# Patient Record
Sex: Female | Born: 1950 | Race: White | Hispanic: No | Marital: Married | Smoking: Former smoker
Health system: Southern US, Community
[De-identification: ages and names within clinical notes are randomized; demographics above are authoritative.]

## PROBLEM LIST (undated history)

## (undated) DIAGNOSIS — F32A Depression, unspecified: Secondary | ICD-10-CM

## (undated) DIAGNOSIS — J449 Chronic obstructive pulmonary disease, unspecified: Secondary | ICD-10-CM

## (undated) DIAGNOSIS — D649 Anemia, unspecified: Secondary | ICD-10-CM

## (undated) DIAGNOSIS — F329 Major depressive disorder, single episode, unspecified: Secondary | ICD-10-CM

## (undated) DIAGNOSIS — C801 Malignant (primary) neoplasm, unspecified: Secondary | ICD-10-CM

## (undated) DIAGNOSIS — E039 Hypothyroidism, unspecified: Secondary | ICD-10-CM

## (undated) DIAGNOSIS — M1990 Unspecified osteoarthritis, unspecified site: Secondary | ICD-10-CM

## (undated) DIAGNOSIS — G709 Myoneural disorder, unspecified: Secondary | ICD-10-CM

## (undated) DIAGNOSIS — E119 Type 2 diabetes mellitus without complications: Secondary | ICD-10-CM

## (undated) DIAGNOSIS — F419 Anxiety disorder, unspecified: Secondary | ICD-10-CM

## (undated) HISTORY — PX: EYE SURGERY: SHX253

## (undated) HISTORY — PX: TONSILLECTOMY: SUR1361

## (undated) HISTORY — PX: ABDOMINAL HYSTERECTOMY: SHX81

## (undated) HISTORY — PX: FRACTURE SURGERY: SHX138

---
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
## 1986-12-24 HISTORY — PX: BREAST SURGERY: SHX581

## 2002-02-24 ENCOUNTER — Inpatient Hospital Stay (HOSPITAL_COMMUNITY): Admission: AD | Admit: 2002-02-24 | Discharge: 2002-02-26 | Payer: Self-pay | Admitting: Cardiovascular Disease

## 2003-11-22 ENCOUNTER — Emergency Department (HOSPITAL_COMMUNITY): Admission: EM | Admit: 2003-11-22 | Discharge: 2003-11-22 | Payer: Self-pay | Admitting: Emergency Medicine

## 2004-11-27 ENCOUNTER — Encounter: Admission: RE | Admit: 2004-11-27 | Discharge: 2004-11-27 | Payer: Self-pay | Admitting: Orthopaedic Surgery

## 2004-11-28 ENCOUNTER — Ambulatory Visit (HOSPITAL_COMMUNITY): Admission: RE | Admit: 2004-11-28 | Discharge: 2004-11-28 | Payer: Self-pay | Admitting: Orthopaedic Surgery

## 2004-11-28 ENCOUNTER — Ambulatory Visit (HOSPITAL_BASED_OUTPATIENT_CLINIC_OR_DEPARTMENT_OTHER): Admission: RE | Admit: 2004-11-28 | Discharge: 2004-11-28 | Payer: Self-pay | Admitting: Orthopaedic Surgery

## 2005-05-08 ENCOUNTER — Ambulatory Visit (HOSPITAL_BASED_OUTPATIENT_CLINIC_OR_DEPARTMENT_OTHER): Admission: RE | Admit: 2005-05-08 | Discharge: 2005-05-08 | Payer: Self-pay | Admitting: Orthopaedic Surgery

## 2005-05-08 ENCOUNTER — Ambulatory Visit (HOSPITAL_COMMUNITY): Admission: RE | Admit: 2005-05-08 | Discharge: 2005-05-08 | Payer: Self-pay | Admitting: Orthopaedic Surgery

## 2005-06-01 ENCOUNTER — Inpatient Hospital Stay (HOSPITAL_COMMUNITY): Admission: EM | Admit: 2005-06-01 | Discharge: 2005-06-11 | Payer: Self-pay | Admitting: Emergency Medicine

## 2005-09-25 ENCOUNTER — Inpatient Hospital Stay (HOSPITAL_COMMUNITY): Admission: RE | Admit: 2005-09-25 | Discharge: 2005-10-01 | Payer: Self-pay | Admitting: Orthopaedic Surgery

## 2005-09-28 ENCOUNTER — Encounter: Payer: Self-pay | Admitting: Cardiology

## 2005-09-28 ENCOUNTER — Ambulatory Visit: Payer: Self-pay | Admitting: Cardiology

## 2006-05-23 ENCOUNTER — Ambulatory Visit (HOSPITAL_COMMUNITY): Admission: RE | Admit: 2006-05-23 | Discharge: 2006-05-24 | Payer: Self-pay | Admitting: Orthopaedic Surgery

## 2006-10-24 ENCOUNTER — Ambulatory Visit (HOSPITAL_COMMUNITY): Admission: RE | Admit: 2006-10-24 | Discharge: 2006-10-25 | Payer: Self-pay | Admitting: Orthopaedic Surgery

## 2008-12-27 ENCOUNTER — Other Ambulatory Visit: Payer: Self-pay | Admitting: Orthopedic Surgery

## 2008-12-27 ENCOUNTER — Encounter: Admission: RE | Admit: 2008-12-27 | Discharge: 2008-12-27 | Payer: Self-pay | Admitting: Orthopedic Surgery

## 2008-12-29 ENCOUNTER — Other Ambulatory Visit: Payer: Self-pay | Admitting: Orthopedic Surgery

## 2008-12-29 ENCOUNTER — Inpatient Hospital Stay (HOSPITAL_COMMUNITY): Admission: EM | Admit: 2008-12-29 | Discharge: 2009-01-04 | Payer: Self-pay | Admitting: Orthopedic Surgery

## 2011-04-09 LAB — GLUCOSE, CAPILLARY
Glucose-Capillary: 106 mg/dL — ABNORMAL HIGH (ref 70–99)
Glucose-Capillary: 109 mg/dL — ABNORMAL HIGH (ref 70–99)
Glucose-Capillary: 126 mg/dL — ABNORMAL HIGH (ref 70–99)
Glucose-Capillary: 157 mg/dL — ABNORMAL HIGH (ref 70–99)
Glucose-Capillary: 164 mg/dL — ABNORMAL HIGH (ref 70–99)
Glucose-Capillary: 82 mg/dL (ref 70–99)
Glucose-Capillary: 85 mg/dL (ref 70–99)
Glucose-Capillary: 88 mg/dL (ref 70–99)
Glucose-Capillary: 91 mg/dL (ref 70–99)
Glucose-Capillary: 95 mg/dL (ref 70–99)
Glucose-Capillary: 96 mg/dL (ref 70–99)

## 2011-04-09 LAB — CBC
HCT: 33.5 % — ABNORMAL LOW (ref 36.0–46.0)
Hemoglobin: 11.3 g/dL — ABNORMAL LOW (ref 12.0–15.0)
MCHC: 33.7 g/dL (ref 30.0–36.0)

## 2011-04-09 LAB — BASIC METABOLIC PANEL
BUN: 5 mg/dL — ABNORMAL LOW (ref 6–23)
CO2: 26 mEq/L (ref 19–32)
Calcium: 8.5 mg/dL (ref 8.4–10.5)
Creatinine, Ser: 0.5 mg/dL (ref 0.4–1.2)
GFR calc non Af Amer: >60 mL/min (ref >60)
GFR calc non Af Amer: >60 mL/min (ref >60)
Glucose, Bld: 103 mg/dL — ABNORMAL HIGH (ref 70–99)
Potassium: 3.6 mEq/L (ref 3.5–5.1)
Sodium: 137 mEq/L (ref 135–145)

## 2011-04-09 LAB — HEMOGLOBIN A1C
Hgb A1c MFr Bld: 6 % (ref 4.6–6.1)
Mean Plasma Glucose: 126 mg/dL

## 2011-05-08 NOTE — Op Note (Signed)
[name], [name]            ACCOUNT NO.:  [id number]   MEDICAL RECORD NO.:  [id number]          PATIENT TYPE:  AMB   LOCATION:  DSC                          FACILITY:  [facility]   PHYSICIAN:  Leonides Grills, M.D.     DATE OF BIRTH:  January 08, 1951   DATE OF PROCEDURE:  12/29/2008  DATE OF DISCHARGE:                               OPERATIVE REPORT   PREOPERATIVE DIAGNOSES:  1. 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  2. Right medial talar dome osteochondral lesion.  3. Right anterior ankle impingement.  4. 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  5. Right traumatic hallux valgus.  6. Right tight gastrocnemius.  7. 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   POSTOPERATIVE DIAGNOSES:  1. 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  2. Right medial talar dome osteochondral lesion.  3. Right anterior ankle impingement.  4. 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  5. Right traumatic hallux valgus.  6. Right tight gastrocnemius.  7. 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   OPERATION:  1. Right Brostrom procedure.  2. Right debridement and drilling, medial talar dome osteochondral      lesion.  3. Right ankle arthroscopy with extensive debridement.  4. Right first tarsometatarsal joint fusion with osteotomy.  5. Right local bone graft.  6. Stress X-rays, right foot.  7. Right modified McBride bunionectomy.  8. Right great toe digital nerve neurolysis.  9. Right gastroc slide.  10.Right flexor digitorum longus to navicular tendon transfer.   ANESTHESIA:  General.   SURGEON:  Leonides Grills, M.D.   ASSISTANT:  Richardean Canal, P.A.   ESTIMATED BLOOD LOSS:  Minimal.   TOURNIQUET TIME:  2 hours.   COMPLICATIONS:  None.   DISPOSITION:  Stable to PR.   INDICATIONS:  This is a  60 year old female who was injured at work and  sustained the above injuries.  She had a previous modified McBride  bunionectomy in the past as well.  It was difficult in surgical planning  or how to balance the forefoot and hindfoot due to the fact that she had  two problems that essentially worked against one another.  Typically  with this type of deformity and posting grade 2 post tibial tendon  insufficiency we would do a medializing  hindfoot osteotomy and possibly  a lengthening calcaneal osteotomy.  However, due to the fact that she  had ankle instability this may exacerbate this.  We elected not to  perform the medialized calcaneal osteotomy and to see how well she did  first with her in neutral alignment and repairing essentially what was  broken.  She was consented for the above procedure.  All risks to  include infection or vessel injury, nonunion, malunion, hardware  rotation, hardware failure, persistent pain, worsening pain, prolonged  recovery, stiffness, arthritis, the fact that smoking does cause in a  direct effect on wound healing, as well as nonunion and the  possibility  that she may have persistent pain that may require future surgery were  all explained.  Questions were encouraged and answered.   OPERATION:  The patient was brought to the operating room and placed in  the supine position.  After adequate general endotracheal anesthesia was  administered, as well as Ancef 1 gram IV piggyback the right lower  extremity was then prepped and draped in the sterile manner over  proximally placed thigh tourniquet.  We started the procedure by  anatomically mapping out the landmarks including our tibialis tendon,  peroneus tertius and the superficial peroneal nerve could not be seen.  A spinal needle was then placed just medial to the anterior tibialis  tendon.  The 20 mL of normal saline was instilled in the ankle.  Weston Brass  and spread technique was then utilized to create the  anteromedial portal  medial to the anterior tibialis tendon.  The blunt tip trocar with  cannula, followed by camera was then placed in the ankle and direct  visualization of the anterolateral portal was created with a spinal  needle, followed by nick and spread technique lateral to the peroneus  tertius tendon.  We illuminated from inside out to hope to protect  against inadvertent injury to the superficial peroneal nerve.  There was  a large amount of synovitis isolated anterolaterally.  This was  extensively debrided with a radiofrequency bevel, as well as a shaver.  We visualized cartilage in this area and there was no obvious  osteochondral lesion.  We then panned out medially and found that there  was a large osteochondral lesion on the medial talar dome.  We then  placed the camera anterolaterally visualizing anterior medially and then  debrided the lesion with a shaver, as well as a House curette.  Once  this was done, we also debrided extensively the local synovitis as well  around the area with a shaver, as well as radiofrequency bevel.  There  was a large amount of synovitis over the entire anterior aspect of the  ankle and it was also debrided.  We then placed multiple drill holes in  the base of the osteochondral lesion using microfracture technique.  This had an Conservation officer, historic buildings.  We then stopped inflow and this bled  nicely.  Pictures were obtained throughout the procedure.  The camera  was removed.  We then made a longitudinal incision over the medial  aspect of gastrocnemius muscle tendinous junction.  Dissection was  carried down through the skin.  Hemostasis was obtained.  The fascia was  opened in line with the incision.  A conjoined region was then developed  between the  gastroc-soleus musculature.  The soft tissue was elevated  off the posterior aspect of the gastrocnemius.  The sural nerve was  identified and protected posteriorly.  The gastrocnemius was then   released with curved Mayo scissors protecting the sural nerve  posteriorly.  This had excellent release of the tight gastroc.  The area  was copiously irrigated with normal saline.  The subcu was closed with 3-  0 Vicryl.  The skin was closed with 4-0 Monocryl subcuticular stitch.  Steri-Strips were applied.  We then gravity exsanguinated the right  lower extremity and tourniquet was elevated to 290 mmHg.  A curvilinear  incision through the previous incision over the anterolateral aspect of  the ankle was then made.  Dissection was carried down through skin.  Hemostasis was obtained.  There was adipose tissue in this area,  as  well.  The scar tissue was removed from the capsule site.  We then  incised the scar tissue capsule complex as close to the anterior border  of the lateral malleolus, leaving about a 2 mm cuff.  This was then  reflected.  We then removed the previous Ethibond from the scar tissue  as well.  We then made a trough into the anterior medial aspect of the  lateral malleolus.  This was very soft bone.  We then created drill  holes in the trough and then advanced the capsule.  Once it was  mobilized and the ankle was held in dorsiflexion everted position.  Advanced the capsule into the trough and drill holes using 2-0 FiberWire  stitch.  This had an Conservation officer, historic buildings.  We then repaired the remaining  cuff to the redundant periosteal/capsular cuff on the lateral malleolus  as well with 2-0 FiberWire stitch.  Again, this had an outstanding  repair.  We then made a longitudinal incision over the medial aspect  midline over the great toe, MTP joint.  Dissection was carried down  through skin.  Hemostasis was obtained.  A great toe digital nerve  neurolysis was then performed and once this was freed up off the  dorsomedial aspect of the MTP joint.  This was retracted out of harm's  way throughout the procedure.  L-shaped capsulotomy was then made.  Simple bunionectomy was  performed with a sagittal saw.  Rocky Link Johnson's  ridge was then rounded off with a rongeur.  The lateral capsule was then  released with curved Beaver blade protecting the cartilaginous surfaces  with a Engineer, site.  This had outstanding release of the tight capsule.  This  essentially completed the modified McBride bunionectomy, but later we  came back to reconstruct the medial capsule.  We then made a  longitudinal incision over the posterior tibial tendon.  Dissection was  carried down through skin.  Hemostasis was obtained.  Flexor retinacular  was opened in line with the incision.  There was a large amount of  synovitis along the posterior tibial tendon.  This was then debrided.  Once this was performed, a tenosynovectomy was the performed.  The FDL  tendon was then identified and traced to the knot of Henry and then  tenotomized as distal as possible.  We then made a drill hole into the  medial portion of the navicular tuberosity.  We then placed a 2-0 fiber  loop through the end of the FDL tendon and pulled this through the drill  hole itself.  We then using a free needle sewed this to the dorsal  periosteum.  We also sewed the FDL to the posterior tibial tendon with 2-  0 FiberWire stitch.  This had an Conservation officer, historic buildings.  The area was  copiously irrigated with normal saline.  We then made a longitudinal  incision over the dorsal aspect in the midline between EHL and the EHB.  Dissection was carried down through skin.  Hemostasis was obtained.  Both EHL and EHB were protected within their respective tendons.  The  periosteum and capsule over the first TMT joint was then elevated  sharply with this 15 blade scalpel.  We then entered the first TMT joint  and then performed an osteotomy, closing wedge type involving the medial  cuneiform.  We then removed this bone with a rongeur, as well as a  curved quarter-inch osteotome.  Once this was removed we then removed  the remaining  cartilage off  the base of the first metatarsal.  The bone  was very soft in this area.  Local bone graft was obtained from the  osteotomy, as well as from the local osteophytes as well and drill bits  and placed on the back table for later grafting.  Once the cartilage was  removed and there was posing well prepared surfaces that were congruent  we then used a two-point reduction clamp and under C-arm guidance found  that this was adequately corrected and the correction was not only in a  valgus plane, but also in a plantar flexion plane as well to help  maintain Meary's line.  Once the first TMT joint was reduced, after the  osteotomy we then used a bur to create a notch into the base of the  first metatarsal.  We then placed two crisscrossing 3.5 mm fully  threaded cortical lag screws using 3.5 and 2.5 mm drill holes,  respectively.  This had excellent purchase and maintenance of the  correction across the fusion site.  Stress and strain relieving local  bone graft was applied to the first TMT joint using a bur, followed by  packing the graft into place.  Stress x-rays were obtained in AP and  lateral planes and showed no gross motion with fixation and proposition  had excellent alignment as well.  We then returned back to the great toe  MTP joint and advanced the capsule both superiorly and proximally and  reconstructed this with 2-0 Vicryl stitch.  This had an Interior and spatial designer.  The tourniquet was deflated.  Hemostasis was obtained.  There  was no pulsatile bleeding.  The subcutaneous was closed with 3-0 Vicryl.  The skin was closed with 4-0 nylon over all wounds.  Sterile dressing  was applied.  Modified Jones dressing was applied with the ankle in  neutral dorsiflexion.  The patient was stable to PR.      Leonides Grills, M.D.  Electronically Signed     PB/MEDQ  D:  12/29/2008  T:  12/29/2008  Job:  [id number]

## 2011-05-08 NOTE — Discharge Summary (Signed)
NAMETIGERLILY, CHRISTINE NO.:  [id number]   MEDICAL RECORD NO.:  [id number]          PATIENT TYPE:  INP   LOCATION:  5526                         FACILITY:  [facility]   PHYSICIAN:  Leonides Grills, M.D.     DATE OF BIRTH:  11/21/1951   DATE OF ADMISSION:  12/29/2008  DATE OF DISCHARGE:  01/04/2009                               DISCHARGE SUMMARY   ADDENDUM   The patient's discharge held due to skilled nurse facility placement.  The patient remained stable throughout the remainder of the hospital  stay.  The pain is under adequate control.  The patient to be discharged  to skilled nursing facility the day once the bed is available.  The  patient at the time of discharge was stable and in good condition.      Richardean Canal, P.A.      Leonides Grills, M.D.  Electronically Signed    GC/MEDQ  D:  01/04/2009  T:  01/05/2009  Job:  [id number]

## 2011-05-08 NOTE — Discharge Summary (Signed)
NAMEDENISHIA, CITRO            ACCOUNT NO.:  [id number]   MEDICAL RECORD NO.:  [id number]          PATIENT TYPE:  INP   LOCATION:  5526                         FACILITY:  [facility]   PHYSICIAN:  Leonides Grills, M.D.     DATE OF BIRTH:  14-Nov-1951   DATE OF ADMISSION:  12/29/2008  DATE OF DISCHARGE:  12/31/2008                               DISCHARGE SUMMARY   ADMITTING DIAGNOSES:  1. Status post right flexor digitorum superficialis navicular tendon      transfer, modified McBride bunionectomy, modified Brostrom      procedure, right ankle arthroscopy with extensive debridement,      debridement and drilling right talar dome osteochondral lesion.  2. Diabetes mellitus type 2.  3. Gastroesophageal reflux disease.  4. Hypercholesterolemia.  5. 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  6. Thyroid disease.  7. Anxiety.  8. Genital herpes.   DISCHARGE DIAGNOSES:  1. Status post right foot and ankle surgery.  2. Diabetes mellitus.  3. Gastroesophageal reflux disease.  4. Hypercholesterolemia.  5. 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  6. Thyroid disease.  7. Anxiety.  8. Genital herpes.   HISTORY OF PRESENT ILLNESS:  Ms. Crilly is a 60 year old female who  underwent a right foot and ankle surgery at [facility].  The patient postoperatively was experiencing poor pain control  and having difficulty with transfers.  The patient was admitted for pain  control and placement in skilled nursing facility as her husband is  disabled.   ALLERGIES:  1. CODEINE causes nausea and vomiting.  2. SULFA causes nausea, vomiting, and swelling.  3. NOVOCAIN headache, and AMBIEN, unknown reaction.  4. ADHESIVE TAPE.   SURGICAL PROCEDURE:  On December 29, 2008, the patient was taken to the  operating room by Dr. Leonides Grills assisted by Richardean Canal, PA-C.  The  patient underwent general anesthesia and underwent a right FDL navicular  tendon transfer, right great toe digital nerve  neurolysis, modified  McBride bunionectomy, modified Brostrom procedure revision, right ankle  arthroscopy with extensive debridement, debridement  and drilling talar  dome osteochondral lesion.  The patient tolerated the procedure well and  returned to recovery room in stable condition.   POSTOPERATIVE COURSE:  The patient was initially scheduled for an  outpatient overnight stay at [facility]; however, she  was having some ear pain and having difficulty with transfers.  She was  admitted for pain control to [facility] where the patient's  stay was unremarkable.   LABORATORY DATA:  CBC on December 30, 2008, white count 6900, hemoglobin  11.3, hematocrit 33.5, platelets 197,000.   BMET; sodium 135, potassium 3.6, chloride 100, bicarb 26, BUN 3,  creatinine 0.5, glucose 113.   Hemoglobin A1c was 6.0.   Using radiograph notes, chest x-ray dated December 27, 2008, showed right  lower lobe airspace opacity, may be acute on chronic in this patient  with history of __________ pneumonia, consider clinically correlate.   CONSULTS:  PT/OT and case management.   MEDICATIONS ON HOLD:  1. Metformin 500 mg 1 p.o.  b.i.d. 8 a.m. and  1630 hours.  2. Acyclovir 400 mg 1 p.o. b.i.d. 10 a.m. and 10 p.m.  3. Wellbutrin 300 mg 1 p.o. daily at 10 a.m.  4. Zetia 10 mg 1 p.o. nightly.  5. Actos 45 mg 1 p.o. daily at 10 a.m.  6. Levothyroxine 175 mcg 1 p.o. daily every 10 a.m.  7. Trazodone 100 mg 1 p.o. nightly.  8. Lantus 45 mg subcu nightly.  9. NovoLog sliding scale t.i.d. __________  10.Lovenox 40 mg 1 subcu daily x3 weeks.  11.Vitamin C 500 mg 1 p.o. daily.  12.Colace 100 mg p.o. b.i.d.  13.MiraLax 17 g 1 p.o. daily.  14.Pepcid 10 mg 1 p.o. b.i.d.  15.Percocet 5/325 one to two tablets p.o. q.4-6 h. p.r.n. pain.  16.Robaxin 500 mg 1 p.o. q.8 h. p.r.n. pain.  17.Zofran 4 mg IV q.8 h. p.r.n.  18.Morphine sulfate 2-3 mg IV q.3 h. p.r.n. pain.  19.Tessalon Perles 100 mg 1  p.o. b.i.d. p.r.n.  20.Protonix 40 mg 1 p.o. daily p.r.n.  21.Senna 1-2 tablets p.o. p.r.n.   DISCHARGE INSTRUCTION/MEDICATIONS:  Aforementioned meds to be adjusted  by skilled nursing facility physician.   ACTIVITY:  The patient is nonweightbearing right lower leg.   DIET:  The patient is CHO, medium (60-g CHO).   CONDITION ON DISCHARGE:  The patient is awaiting discharge to skilled  nursing facility, was in good, stable condition.  Pain under adequate  control.   FOLLOWUP:  The patient should follow up in Dr. Ashok Norris office 2 weeks  from day of surgery, December 29, 2008.  Call office at [phone] for  appointment.   SPECIAL CARE:  1. Dressing to right lower leg to be clean, dry and intact.  2. Elevate leg above the heart periodically.  Encourage movement of      toes.      Richardean Canal, P.A.      Leonides Grills, M.D.  Electronically Signed    GC/MEDQ  D:  12/31/2008  T:  01/01/2009  Job:  [id number]   cc:   Leonides Grills, M.D.

## 2011-05-11 NOTE — Discharge Summary (Signed)
[place]  Patient:    [name], [name] Visit Number: [id number] MRN: [id number]          Service Type: MED Location: 2000 2010 01 Attending Physician:  Colon Branch Dictated by:   Guy Franco, P.A. Admit Date:  02/24/2002 Disc. Date: 02/26/02   CC:         Paul L. Neita Carp, M.D.; [address] [place], [place] [address]  Fax also to [phone] (Dr. Neita Carp)  Lewayne Bunting, M.D. [facility]   Discharge Summary  DISCHARGE DIAGNOSES: 1. Atypical chest pain. 2. Diabetes, untreated. 3. Hyperlipidemia. 4. Family history of coronary artery disease. 5. Tobacco abuse.  ALLERGIES: CODEINE, SULFA and NOVOCAIN.  HOSPITAL COURSE:  The patient is a 60 year old female who was seen at [facility] with chest discomfort.  She was seen in consultation by Dr. Simona Huh who referred her to [facility] for cardiac catheterization and work-up.  She apparently ruled out for myocardial infarction, however, with her continued pain and significant risk factors she was taken to the cardiac catheterization lab on February 26, 3002.  She was found to have the following:  Left main patent, LAD with a mid 25% lesion, circumflex small, RCA nondominant with a proximal 20% lesion, EF with 65%.  She was found to have nonobstructive coronary artery disease.  Therefore another source for her pain was investigated.  We did order a D-dimer, and this was negative. Therefore, we will discharge her to home with strict follow-up to see Dr. Neita Carp.  Apparently the patient has been on medications in the past for diabetes and thyroid medicines but has stopped this.  Apparently she also has a history of hypothyroidism per Dr. Orson Gear report.  In addition, because of her diabetes, obesity and cardiac risk factors her cholesterol is also of concern.  Her labs today showed total cholesterol 267, triglycerides 511, HDL 39, and LDL is unable to be calculated secondary to  her hypertriglyceridemia.  Other lab studies during her stay include total CK of 90, MB of 3.1, troponin 0.01, glucose 292, BUN 7, creatinine 0.7, potassium 3.6, white count 7.2, hemoglobin 13.4, hematocrit 37.9, platelets 171,000.  FOLLOW-UP:  She is instructed to follow up with Dr. Neita Carp on February 27, 2002, at 10:15 a.m.  I have made this appointment for her in hopes that she will follow up with him and restart her much needed medications.  DISCHARGE INSTRUCTIONS:  In the meantime, no strenuous activity for two days and then gradually increase her activity back to normal.  Remain on a low fat diabetic diet.  Clean over her catheterization site with soap and water and call for any questions or concerns.  In addition, am faxing her lab results to Dr. Neita Carp. Dictated by:   Guy Franco, P.A. Attending Physician:  Colon Branch DD:  02/26/02 TD:  02/26/02 Job: [id number]

## 2011-05-11 NOTE — Op Note (Signed)
[name], [name]            ACCOUNT NO.:  [id number]   MEDICAL RECORD NO.:  [id number]          PATIENT TYPE:  INP   LOCATION:  5014                         FACILITY:  [facility]   PHYSICIAN:  Lubertha Basque. Dalldorf, M.D.DATE OF BIRTH:  [birth year]   DATE OF PROCEDURE:  09/25/2005  DATE OF DISCHARGE:                                 OPERATIVE REPORT   PREOPERATIVE DIAGNOSES:  1.  Right shoulder impingement and rotator cuff tear.  2.  Right carpal tunnel syndrome.  3.  Right ankle instability.   POSTOPERATIVE DIAGNOSES:  1.  Right shoulder impingement and rotator cuff tear.  2.  Right carpal tunnel syndrome.  3.  Right ankle instability.   PROCEDURES:  1.  right shoulder arthroscopic acromioplasty.  2.  Right shoulder open rotator cuff repair.  3.  Right carpal tunnel release.  4.  Right ankle Brostrom reconstruction.   ANESTHESIA:  General and shoulder block.   ATTENDING SURGEON:  Lubertha Basque. Jerl Santos, M.D.   ASSISTANT:  Lindwood Qua, P.A.   INDICATIONS FOR PROCEDURE:  The patient is a 60 year old woman with a fairly  complicated history.  She has chronic ankle instability and has fallen  several times, sustaining her various injuries.  She is status post ORIF of  the right wrist and has a complicating carpal tunnel this point.  She is  status post repair of her opposite rotator cuff, which has done well, but at  this point, has trouble with an injured right shoulder which she cannot  bring forward at all.  Our plan at this point is to address her ankle, which  is the primary problem.  We will perform a reconstruction here with soft  tissue.  We will also address her shoulder, which has a probable rotator  cuff tear, and also release of her carpal tunnel on the same side.  Informed  operative was obtained after discussion of possible complications of  reaction to anesthesia, infection, DVT, PE, death, and neurovascular injury.   DESCRIPTION OF PROCEDURE:  The patient was  taken to the operating suite  where a general anesthetic was applied without difficulty.  She was also  given a block in the preanesthesia area.  She was positioned in a beach-  chair position and prepped and draped in normal sterile fashion.  After the  administration of preop IV antibiotic, an arthroscopy of the right shoulder  was performed through a total of 3 portals.  The glenohumeral joint showed  no degenerative change.  The biceps tendon appeared to be in normal position  and was benign.  She had normal labral structures.  The rotator cuff  exhibited a massive tear with about 2 cm of retraction, though her tissues  seemed fairly good.  It was felt best to address this in an open fashion in  an attempt to repair.  I made an anterolateral incision with deltoid split  to expose the shoulder.  I put some tag stitches in the rotator cuff and  freed this up manually until I could bring it over towards the greater  tuberosity.  I sacrificed about a centimeter of  articular cartilage with a  bur and create a large flat bleeding bed of bone.  We then placed 2 of the  Arthrex 5.5-mm anchors.  I passed sutures through the cuff and tied a  mattress suture on top of the rotator cuff.  I then crossed the sutures and  placed 2 of the push locks by Arthrex into cortical bone to secure this  tissue down to the bleeding bed of bone across a broad area.  We achieved a  nice tight flat repair.  Excess sutures were trimmed.  This wound was  irrigated, followed by reapproximation of the deltoid fascial tissues with a  2-0 undyed Vicryl and subcutaneous tissues with the same suture.  I then  closed the incision and all portals with nylon.  Attention was then turned  toward the right carpal tunnel.  This was then wrapped at the same time as  the shoulder.  We made a small palmar incision which did not cross the wrist  flexion crease and was ulnar to the thenar flexion crease.  I dissected down  through  palmar fascia to expose the transverse carpal ligament, which was  released under direct visualization.  I took the dissection distally through  this tissue out to the transverse arch of vessels and proximally through the  distal fascia of the forearm.  The nerve seemed to be moderately compressed.  The wound was then irrigated, followed by reapproximation of the skin with  vertical mattresses of nylon.  Marcaine was injected here and at the left  shoulder.  We placed Adaptic on all wounds followed by dry gauze and tape,  and at the wrist, I applied a volar splint of plaster with the wrist in  slight extension.  We did inflate a tourniquet at the beginning of the  carpal tunnel portion of the case and deflated it towards the end with all  of her fingers becoming pink and warm immediately.  This was a sterile  tourniquet.  At this point, we reprepped and draped, and prepared for the  right ankle operation.  This extremity was elevated, exsanguinated, and a  tourniquet was inflated about the calf.  I made an anterolateral incision  across the anterior border of the fibula.  Dissection was carried down to  the extensor retinaculum, which was tagged with a Ethibond.  The dissection  was taken distally to the peroneal tendons and deep to these, I could fine a  thickening in the capsule consistent with the calcaneofibular ligament.  I  cut this area and around the front in some tissues that seemed consistent  with an anterior talofibular ligament.  I used 0 Ethibond suture to repair  this in a shortened fashion to a cuff of tissue which remained on the  fibula.  I then oversewed the extensor retinaculum with the same suture, and  this was sewn directly to the soft tissue on the fibula.  This seemed to  repair her ligaments well.  The tourniquet was deflated and the foot became pink and warm immediately.  A small amount of bleeding was easily controlled  with Bovie cautery.  Subcutaneous tissues  were reapproximated with 2-0  undyed Vicryl and skin with nylon.  Adaptic was placed over the wound here  after some Marcaine was injected.  A dry gauze dressing and a loose Ace wrap  were applied over a posterior splint of plaster with the ankle in neutral  position.  Estimated blood loss and intraoperative fluids as  well as an  accurate tourniquet times can be obtained from anesthesia records.   DISPOSITION:  The patient was extubated in the operating room and taken to  recovery room in stable condition.  Plans were for her to be admitted to the  Orthopedic Surgery Service for appropriate postop care to include  perioperative antibiotics and Social Work consultation.      Lubertha Basque Jerl Santos, M.D.  Electronically Signed     PGD/MEDQ  D:  09/25/2005  T:  09/26/2005  Job:  [id number]

## 2011-05-11 NOTE — Discharge Summary (Signed)
NAMEKATERINA, [name]            ACCOUNT NO.:  [id number]   MEDICAL RECORD NO.:  [id number]          PATIENT TYPE:  INP   LOCATION:  5015                         FACILITY:  [facility]   PHYSICIAN:  Lubertha Basque. Dalldorf, M.D.DATE OF BIRTH:  1951/05/27   DATE OF ADMISSION:  06/01/2005  DATE OF DISCHARGE:  06/08/2005                                 DISCHARGE SUMMARY   ADMITTING DIAGNOSES:  1. Right radius fracture, status post open reduction and internal      fixation.  2. Right fibular fracture, stable.  3. Right rotator cuff tear.  4. Status post left rotator cuff repair.  5. Diabetes mellitus.   DISCHARGE DIAGNOSES:  1. Right radius fracture, status post open reduction and internal      fixation.  2. Right fibular fracture, stable.  3. Right rotator cuff tear.  4. Status post left rotator cuff repair.  5. Diabetes mellitus.   OPERATION:  One day prior to being admitted to the hospital in an outpatient  setting underwent distal radius ORIF.   BRIEF HISTORY:  Ms. [name] is a 60 year old white patient, well known to  our practice.  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  That same injury had caused her to  have discomfort, pain, and impingement in her right ankle.  Due to the  combination of injuries, we were not able to fix her ankle until her left  rotator cuff was strong enough to have her bear weight with crutches or a  walker, so we were waiting to fix this.  During this period, she had lost  her balance and fallen because her right ankle had given out, suffering a  fracture of the distal radius, tearing her right rotator cuff, and a distal  fibular nondisplaced fracture.  We had discussed treatment options with her,  and had decided, because of the extent of the injury to her radius, to go  ahead and proceed with ORIF and then deal with the  other components of her  injuries at a later date.   PERTINENT LABORATORY AND X-RAY DATA:  No laboratory work was done, but her  ankle x-rays in the hospital revealed a distal fibular fracture well above  the mortise noted, internal fixation of the radius in anatomic position and  alignment on the right side, and a CT scan of her head showed normal, and  these were done due to a fall that she had during her hospitalization.   COURSE IN THE HOSPITAL:  She was kept on her home medicines of Levoxyl 0.137  daily, glyburide 5 mg b.i.d., metformin 500 mg b.i.d., Zetia 10 mg q.h.s.,  Wellbutrin 300 mg one a day, Actos 45 mg one a day, trazodone, albuterol, as  well as put on restraints p.r.n.  She was on a low-dose morphine PCA  protocol, Tylox 1-2 q.4-6 for pain.  Ancef was given 1 g q.8h. x3 doses,  Tylenol for temperature elevation, Phenergan for nausea, ice to all of these  areas, right upper extremity, right lower extremity.  Physical therapy for  transfers, nonweightbearing, on her right arm and right leg.  Social service  consult for placement.  During her hospitalization as well, she had elevated  blood sugars that were not controlled by her oral medicines, so it was  necessary to put her on an insulin basal dose of Lantus 20 units q.h.s. and  then a correctional NovoLog scale.  During her hospitalization, her blood  sugars were elevated and noted to be in the chart.  She had good  neurovascular status of both upper and lower extremities.  Her blood  pressure was 154/92, heart rate 92, temperature 97.  Normal sensation to  upper and lower extremities.  Her right upper extremity was in a plaster  splint, and she had good motion of her fingers and elbow motion.  Shoulder  motion is limited because of a rotator cuff tear there with significant  rotator cuff weakness.  On the evening of June 03, 2005, she had fallen out  of bed, disoriented, and had hit her head.  CT scan, as mentioned  above, was  completed and was normal.  She was oriented the next morning and felt that  it may be due to the Ambien that caused her to be disoriented.  Workmen's  Compensation was contacted for nursing home placement skilled facility on a  temporary basis, as she lives with her husband, who is disabled and not  really able to care for her, and we felt that we needed to have her in a  safe environment.  The Cam boot that she had on her right lower extremity  had caused a slight area of irritation on her medial malleolus, and a new  Cam boot was applied that could be worn when she was up and helpful to hold  her ankle at a 90-degree position and not to allow her heel cord to get  tight.  Right upper extremity splint was removed, and a Velcro splint was  put on that she could take on and off for range of motion.   CONDITION ON DISCHARGE:  Improved.   FOLLOWUP:  With the help of physical therapy, she can be bed-to-chair  transfer.  She is to be nonweightbearing or just touch-down weightbearing on  the right lower extremity, full weightbearing on the left lower extremity.  She is going to have a difficult time, and we really do not want her to bear  any weight on her right upper extremity.  She can put some weight on her  left upper extremity.  We want to see her back in our office in about 10  days.  She can have a black Velcro wrist splint on her right upper extremity  that can be taken off multiple times throughout the day for gentle range of  motion of her wrist.  She can also take off her Cam boot on the right lower  extremity for range of motion as well, and skin should be checked there on a  regular basis as to be sure that there is no skin breakdown.  She will have  her blood sugars checked before meals and q.a.m. before breakfast and at  nighttime before going to bed.  She will be on her home medicines, which will be described in a moment, as well as the sliding scale of  subcutaneous  insulin, monitor insulin sensitivity, and continue her insulin basal Lantus  20 units q.h.s.   I  think that it will be necessary to contact workmen's compensation so that  we can get a medical consult for blood sugar control while in her nursing  facility.  She will be discharged on the following medications.   DISCHARGE MEDICATIONS:  1. Actos 45 mg once daily.  2. DiaBeta or Micronase 5 mg b.i.d. before meals.  3. Glucophage 500 mg b.i.d.  4. Lantus insulin 20 units q.h.s.  5. NovoLog sliding scale insulin as described.  6. Synthroid 0.137 one daily.  7. Wellbutrin 300 mg XL tabs one daily.  8. Zetia 10 mg q.h.s.  9. Percocet 1-2 q.4-6h. p.r.n. for pain.  10.Phenergan 12.5 to 25 mg for nausea p.r.n. q.4-6h.  11.Soma 350 one q.8h. for spasm.  12.Tylenol 325 two q.4-6 for temperature elevation above 100.   We want to see her back in our office in about seven days for a recheck, [phone]  [phone], any signs of infection, irritation, also call that same number.   DIET:  Diabetic diet.       MC/MEDQ  D:  06/08/2005  T:  06/08/2005  Job:  [id number]

## 2011-05-11 NOTE — Consult Note (Signed)
NAMEDAWNYEL, LEVEN            ACCOUNT NO.:  [id number]   MEDICAL RECORD NO.:  [id number]          PATIENT TYPE:  INP   LOCATION:  5014                         FACILITY:  [facility]   PHYSICIAN:  Mobolaji B. Bakare, M.D.DATE OF BIRTH:  12-12-1951   DATE OF CONSULTATION:  09/26/2005  DATE OF DISCHARGE:                                   CONSULTATION   PRIMARY CARE PHYSICIAN:  Dr. Neita Carp   REFERRING PHYSICIAN:  Dr. Jerl Santos   REASON FOR REFERRAL:  Shortness of breath.   HISTORY OF PRESENT COMPLAINT:  [name] was admitted on September 25, 2005, yesterday, for surgical procedure which includes right shoulder  arthroscopic acromioplasty, right shoulder open rotator cuff repair, right  carpal tunnel release, right ankle prostrom reconstruction.  About 7:10 a.m.  today she had developed shortness of breath.  She felt like something was  blocking her windpipe.  This was followed by nausea and vomiting.  She  subsequently experienced retrosternal chest pain which was about 8/10 and  lasted about 30 minutes.  It radiated to her throat associated with  palpitations such that she could hear her heart beating in her ears and  diaphoresis.  There was no numbness.  Shortness of breath improved after  oxygen was applied.  She was saturating at 85% on room air and she is  currently on 50% Venti-Mask 2%.  Now feels better.  Chest pain is resolved.  There is no fever.  There is no chills, cough.   REVIEW OF SYSTEMS:  No abdominal pain, diarrhea.  She last moved her bowel  yesterday.  There is no dysuria, urgency, or increased frequency or  micturition.  No headaches.   PAST MEDICAL HISTORY:  1.  Diabetes mellitus for 10 years.  2.  Hyperlipidemia.  3.  Obesity.  4.  Hypothyroidism.  5.  History of benign ovarian tumor in 1991.   PAST SURGICAL HISTORY:  1.  Tonsillectomy at the age of 60.  2.  Laparotomy for ectopic pregnancy at the age of 60.  At that time she had      appendectomy.  3.   Appendectomy.  4.  Open reduction internal fixation of right radial fracture, right fibular      fracture.  5.  Right rotator cuff tear.  6.  Right fibular fracture.  7.  Status post left rotator cuff repair.   ALLERGIES:  She is intolerant to CODEINE with nausea, SULFA gives her  swelling of her genitals and throat.   CURRENT MEDICATIONS:  1.  Wellbutrin 300 mg p.o. daily.  2.  Cefazolin 1 g IV q.8h. to end on October 4.  3.  Zetia 10 mg p.o. daily.  4.  Lasix 40 mg p.o. daily.  5.  Glyburide 5 mg p.o. b.i.d.  6.  Sliding scale insulin.  7.  Lantus 20 units q.h.s.  8.  Ringer's lactate 80 mL/hour.  9.  Synthroid 137 mcg p.o. daily.  10. Metformin 500 mg p.o. b.i.d.  11. Morphine PCA.  12. Protonix 40 mg daily.  13. Actos 45 mg p.o. q.a.c.  14. Trazodone 50 mg p.o.  q.h.s.  15. Soma 350 mg p.o. p.r.n.  16. Oxycodone/APAP.  17. Promethazine p.r.n.   FAMILY HISTORY:  Both parents had myocardial infarction in their 61s.  She  has a family history of diabetes mellitus.   SOCIAL HISTORY:  She has been smoking since the age of 4 on and off and she  has currently recently quit smoking.  She has attempted quitting several  times.  She does not drink alcohol.   PHYSICAL EXAMINATION:  VITAL SIGNS:  Blood pressure 133/73, heart rate 88,  respiratory rate 18, O2 saturations 94 on 50% Venti-Mask.  GENERAL:  She does not appear currently dyspneic, not pale.  HEENT:  Anicteric.  Normocephalic, atraumatic head.  No elevated JVD.  No  thyromegaly.  No carotid bruit.  Mucous membrane dry.  LUNGS:  No wheeze.  No rhonchi.  Fascicular breath sounds bilaterally.  Reduced air entry in the lung bases.  CVS:  S1, S2 regular.  No murmur.  No gallop.  ABDOMEN:  Obese, soft, nontender.  No hepatosplenomegaly.  Bowel sounds  present.  No holosystolic murmur.  EXTREMITIES:  Right lower extremity is in a support cast.  Same with right  upper limb in support cast boot and foot are pink and there is  no numbness.  She could regrow them.  Left lower extremity:  No calf tenderness.  Dorsalis  pedis pulse 2+ palpable.  CNS:  No focal neurological deficit.  SKIN:  No rash.  No petechiae.   INITIAL LABORATORY DATA:  Not available at this time.  They have been  ordered.   Chest x-ray shows bibasal atelectasis.   EKG:  Normal sinus rhythm with a heart rate of 84 beats per minute, normal  intervals, T-wave inversion in the aVL.   ASSESSMENT/PLAN:  [name] is a 60 year old white female with multiple  cardiovascular risk factors including diabetes mellitus, hyperlipidemia,  obesity.  She developed shortness of breath and chest pain postoperatively.  EKG shows no acute ischemic changes.  Chest x-ray showed bibasal  atelectasis.  She is significantly hypoxemic requiring 50% Venti-Mask.  Differentials include pulmonary embolism, cardiac ischemia, congestive heart  failure, pneumonia.   RECOMMENDATIONS:  Transfer to telemetry floor.  Obtain ABG, serial cardiac  enzymes, D-dimer, CMET, CBC, and differential.  If creatinine is okay will  go ahead and obtain a CT chest angiogram to rule out PE and aortic  dissection.  Continue to titrate oxygen and keep O2 saturations greater than  92%.  Give incentive spirometer.  Further recommendation will depend on  results of condition and work-up.      Mobolaji B. Corky Downs, M.D.  Electronically Signed     MBB/MEDQ  D:  09/26/2005  T:  09/26/2005  Job:  [id number]   cc:   Fara Chute  Fax: [phone]

## 2011-05-11 NOTE — Op Note (Signed)
[name], [name]            ACCOUNT NO.:  [id number]   MEDICAL RECORD NO.:  [id number]          PATIENT TYPE:  AMB   LOCATION:  SDS                          FACILITY:  [facility]   PHYSICIAN:  Lubertha Basque. Dalldorf, M.D.DATE OF BIRTH:  1951/01/17   DATE OF PROCEDURE:  05/23/2006  DATE OF DISCHARGE:                                 OPERATIVE REPORT   PREOPERATIVE DIAGNOSES:  1.  Right shoulder recurrent rotator cuff tear.  2.  Right shoulder biceps subluxation and tendinopathy.   POSTOPERATIVE DIAGNOSES:  1.  Right shoulder recurrent rotator cuff tear.  2.  Right shoulder biceps subluxation and tendinopathy.   PROCEDURES:  1.  Right shoulder arthroscopic debridement partial rotator cuff tear.  2.  Right shoulder arthroscopic biceps tenotomy.   ANESTHESIA:  General en bloc.   ATTENDING SURGEON:  Lubertha Basque. Jerl Santos, M.D.   ASSISTANT:  Lindwood Qua, P.A.   INDICATIONS FOR PROCEDURE:  The patient is a 60 year old woman who is about  9 months from a mini open rotator cuff repair.  She had a large retracted  tear which I repaired with two rows of sutures.  She has persisted with  difficulty bringing her arm up over her head and by a recent MRI scan has a  retracted tear of the rotator cuff.  She also has a subluxation of her  biceps and tendinopathy of this structure as well.  She is offered a repeat  arthroscopy as she has pain which limits her ability to rest and to use her  arm.  Informed operative consent was obtained after discussion of possible  complications of reaction to anesthesia and infection.   SUMMARY:  Under general anesthesia and a block through three old portals, an  arthroscopy of the right shoulder was performed.  She did have some partial-  thickness tearing of the rotator cuff debrided from below but the sutures  all appeared to be intact in tissue which moved with the humeral head.  This  did not seem something that should be debrided.  Her biceps tendon  was  dislocated and degenerative and was released at the level of her upper  glenoid.  There were no degenerative changes.  Bryna Colander assisted  throughout.   DESCRIPTION OF PROCEDURE:  The patient was taken to the operating suite  where general anesthetic was applied without difficulty.  She was positioned  in beach-chair position and prepped and draped in normal sterile fashion.  After the administration of preoperative IV Kefzol, an arthroscopy of the  right shoulder was performed through three old portals.  Glenohumeral joint  showed no degenerative changes and the biceps tendon appeared subluxed  medially across a partially torn subscapularis.  The tendon also appeared  fairly degenerative.  This was released at the level of the glenoid.  I  debrided a small superior aspect subscapularis tear, but most of this tendon  was really intact.  The rotator cuff, when examined from below, appeared to  be intact and there were four arms of sutures which passed up through this  tissue.  She did have some partial-thickness tearing at one  border  consisting of about 50% of the thickness of the tendon and this was  debrided.  I then entered the subacromial space.  I could see no evidence of  a cuff tear from this aspect with good tissue coming down onto the humeral  head.  I could see one of the sutures in proper position and the other could  not be isolated.  There appeared to be no significant impingement related to  any prominent bony structure, no additional bony work was required.  The  shoulder was thoroughly irrigated, followed by placement of some Marcaine  and reapproximation of the portals loosely with nylon.  Adaptic was applied  to the portals followed by dry gauze and tape.  Estimated blood loss and  intraoperative fluids can be obtained from anesthesia records.   DISPOSITION:  The patient was extubated in the operating room and taken to  recovery in stable condition.  She was to  stay for overnight observation  with probable discharge home in the morning.  We will also try to manage her  pain appropriately overnight which has been an issue in the past.      Lubertha Basque. Jerl Santos, M.D.  Electronically Signed     PGD/MEDQ  D:  05/23/2006  T:  05/23/2006  Job:  [id number]

## 2011-05-11 NOTE — Cardiovascular Report (Signed)
[name]. [facility]  Patient:    LATERA, MCLIN Visit Number: [id number] MRN: [id number]          Service Type: MED Location: 2000 2010 01 Attending Physician:  Colon Branch Dictated by:   Lewayne Bunting, M.D. [facility] Proc. Date: 02/26/02 Admit Date:  02/24/2002   CC:         Fara Chute, M.D., Corrin Parker, M.D. [facility], [facility]   Cardiac Catheterization  DATE OF BIRTH: 03-27-1951  REFERRING PHYSICIAN: Fara Chute, M.D.  CARDIOLOGIST: Jonelle Sidle, M.D.  PROCEDURES PERFORMED: 1. Left heart catheterization with selective coronary angiography. 2. Ventriculography. 3. Distal aortography.  DIAGNOSES: 1. No evidence of flow-limiting epicardial coronary artery disease. 2. Mild atheromatous of the distal aorta.  HISTORY: The patient is a 60 year old female with multiple cardiac risk factors including diabetes mellitus. The patient was admitted to [facility] with substernal chest pain. Subsequently, she ruled out for myocardial infarction and was then transferred for cardiac catheterization to assess her coronary anatomy.  DESCRIPTION OF PROCEDURE: After informed consent was obtained, the patient was brought to the catheterization laboratory. The right groin was sterilely prepped and draped.  Lidocaine 1% was used to infiltrated. A 6 French arterial sheath was placed using the modified Seldinger technique.  Subsequently JL4 and JR4 catheters were used to engage the left and right coronary system respectively. Selective coronary angiography was performed in various projections. The circumflex coronary artery appeared to be a very small vessel and aortic root shot was made to make sure that this was not an aberrant vessel. After selective coronary angiography, a 6 French pigtail catheter was placed in the left ventricular cavity and appropriate left-sided hemodynamics were obtained. Ventriculography was then  performed in a single plan RAO projection using power injection of contrast. At the termination of the procedure, all catheters and sheath were removed and the patient was brought back to the holding area.  FINDINGS:  HEMODYNAMICS: Left ventricular pressure 140/18 mmHg, aortic pressure 140/78 mmHg.  VENTRICULOGRAPHY: Ejection fraction exercise stress test at 65%. No segmental wall motion abnormalities. No mitral regurgitation.  DISTAL AORTOGRAPHY: No evidence of renal artery stenosis. Mild atherosclerotic change in the distal aorta just proximal to the iliac bifurcation.  SELECTIVE CORONARY ANGIOGRAPHY: 1. Left main coronary is a large caliber vessel with no evidence of    flow-limiting coronary artery disease. 2. Left anterior descending artery was a large caliber vessel with    approximately 20% plaquing in the mid vessel. The diagonal branches    were free of disease. 3. Circumflex was a very small vessel and the blood flow to the    lateral wall is predominately supplied by large posterolateral    branches from the right coronary artery. 4. Right coronary artery is a dominant vessel with about 20% stenosis in the    proximal vessel. The RCA terminates in a very large posterior descending    artery and two large posterolateral branches.  RECOMMENDATIONS: Angiographic images were reviewed with Dr. Juanda Chance. Study results do not support angina as the patients cause of chest pain. D-dimer will be obtained to rule out the pulmonary embolism. It appears the patients chest pain is rather atypical and may be more likely musculoskeletal in nature. The results will be communicated to Dr. Neita Carp and the patient can followup with her primary physician. Dictated by:   Lewayne Bunting, M.D. [facility] Attending Physician:  Colon Branch DD:  02/26/02 TD:  02/26/02  Job: [id number]

## 2011-05-11 NOTE — Op Note (Signed)
[name], ARRAZOLA            ACCOUNT NO.:  [id number]   MEDICAL RECORD NO.:  [id number]          PATIENT TYPE:  OIB   LOCATION:  5003                         FACILITY:  [facility]   PHYSICIAN:  Lubertha Basque. Dalldorf, M.D.DATE OF BIRTH:  1951/03/23   DATE OF PROCEDURE:  10/24/2006  DATE OF DISCHARGE:                                 OPERATIVE REPORT   PREOPERATIVE DIAGNOSIS:  Right shoulder recurrent rotator cuff tear.   POSTOPERATIVE DIAGNOSIS:  Right shoulder recurrent rotator cuff tear.   PROCEDURE:  Right shoulder arthroscopic debridement.   ANESTHESIA:  General en bloc.   ATTENDING SURGEON:  Lubertha Basque. Jerl Santos, M.D.   INDICATIONS FOR PROCEDURE:  The patient is a 60 year old woman with a long  history of right shoulder difficulty.  In October 2006 we performed a  rotator cuff repair for a fairly massive injury.  Unfortunately this did not  lead to significant pain relief or improved function.  In May 2007 we went  back into her shoulder and performed a biceps tenotomy and examined her  rotator cuff.  The cuff actually appeared fairly intact, though tenuous and  a bit thin at the repair site.  She went through extensive rehabilitation,  but unfortunately has been left with a shoulder which remains fairly  dysfunctional.  By MRI arthrogram she does fill the subacromial space  quickly with dye consistent with a recurrent large tear, though the reading  does not show a full-thickness tear.  My feeling is that she probably does  have a full-thickness tear; and our plan, at this point, is just to go and  debride the torn portion without an additional repair.  An informed  operative consent was obtained after discussion of the possible  complications of reaction to anesthesia and infection and shoulder  stiffness.   SUMMARY FINDINGS AND PROCEDURE:  Under general anesthesia and a block  through two old portals an arthroscopy of the right shoulder was performed.  She did have a large  recurrent rotator cuff tear.  This was debrided back to  the level of the glenoid leaving intact anterior and posterior sleeves.  No  additional bony work was done.  She had no degenerative changes in the  glenohumeral joint.   DESCRIPTION OF PROCEDURE:  The patient was brought to the operating suite  where general anesthetic was applied without difficulty.  She also given a  block in the preanesthesia area.  She was positioned in beach-chair position  and prepped and draped in a normal sterile fashion.  After the  administration of IV Kefzol, an arthroscopy of the right shoulder was  performed through two old portals.  The glenohumeral joint showed no  degenerative change; and the rotator cuff did appear to be torn.  There was  about a centimeter sized tear with some slight retraction.  There was  significant devitalized, thin tissue in the area; and after debridement,  this tear was about 2 cm in size and I took it back to the level of the  glenoid, leaving intact anterior-and-posterior sleeves.  Some residual  suture material was also removed.  The shoulder  was thoroughly irrigated  followed by reapproximation of the portals loosely with nylon.  Adaptic was  applied followed by dry gauze and tape.  Estimated blood loss and  intraoperative fluids can be obtained from anesthesia records.   DISPOSITION:  The patient was extubated in the operating room and taken to  recovery room in stable addition.  She was to be admitted for overnight  observation with probable discharge home in the morning.  She will receive  oral pain medication for pain control along with a block that she has  received; and she will resume all home meds including her insulin.      Lubertha Basque Jerl Santos, M.D.  Electronically Signed     PGD/MEDQ  D:  10/24/2006  T:  10/24/2006  Job:  [id number]

## 2011-05-11 NOTE — Op Note (Signed)
[name], [name]            ACCOUNT NO.:  [id number]   MEDICAL RECORD NO.:  [id number]          PATIENT TYPE:  AMB   LOCATION:  DSC                          FACILITY:  [facility]   PHYSICIAN:  Lubertha Basque. Dalldorf, M.D.DATE OF BIRTH:  1951/05/21   DATE OF PROCEDURE:  05/08/2005  DATE OF DISCHARGE:                                 OPERATIVE REPORT   PREOPERATIVE DIAGNOSIS:  Left shoulder recurrent rotator cuff tear.   POSTOPERATIVE DIAGNOSES:  1. Left shoulder stiffness.  2. Left shoulder prominent knots.     PROCEDURES:  1. Left shoulder closed manipulation.  2. Left shoulder arthroscopic debridement and removal of prominent knots.     ANESTHESIA:  General en bloc.   ATTENDING SURGEON:  Lubertha Basque. Jerl Santos, M.D.   ASSISTANT:  Lindwood Qua, P.A.   INDICATIONS FOR PROCEDURE:  The patient is a 60 year old woman with long  history of left shoulder difficulty. She is about six months out from an  arthroscopic and open rotator cuff repair for a very large tear. This was  done with a total of six sutures. She has regained some function but still  has a great deal of pain with attempted overhead use and difficulty even  reaching that position. She has been through extensive physical therapy. By  MRI scan, she may have some small areas of rotator cuff tear. With continued  pain with attempted use of her arm, she is offered a repeat operation which  was to consist of an arthroscopy with or without rotator cuff repeat repair.  Informed operative consent was obtained, after discussion of possible  complications of reaction to anesthesia and infection.   DESCRIPTION OF PROCEDURE:  The patient was taken to the operating suite  where general anesthetic was applied without difficulty. She was also given  a block in the preanesthesia area. She was positioned in beach-chair  position and prepped and draped in normal sterile fashion. After  administration of preoperative IV antibiotics,  an arthroscopy of the left  shoulder was formed through a total of two portals. Glenohumeral joint  showed no degenerative change. The biceps tendon appeared benign throughout  her shoulder. The rotator cuff appeared repaired from below to the more  peripheral row of sutures. In the subacromial space, she had some prominent  knots and three of these were removed. It appeared that the any of these  could have been causing impingement pain. The rotator cuff was debrided and  thoroughly inspected and no areas of full-thickness nor significant partial  thickness tear could be found. The shoulder was thoroughly irrigated,  followed by placement of some Marcaine with epinephrine. Simple sutures of  nylon were used to loosely reapproximate the portals followed by Adaptic and  dry gauze dressing with silk tape as she had she  had expressed an allergy  to some of the other tapes in the past. Estimated blood loss and  intraoperative fluids can be obtained from anesthesia records.   DISPOSITION:  The patient was extubated in the operating room and taken to  the recovery room in stable addition. Plans were for her potentially to stay  overnight for pain control with probable discharge home in the morning.      PGD/MEDQ  D:  05/08/2005  T:  05/08/2005  Job:  [id number]

## 2011-05-11 NOTE — Discharge Summary (Signed)
[name], [name]            ACCOUNT NO.:  [id number]   MEDICAL RECORD NO.:  [id number]          PATIENT TYPE:  INP   LOCATION:  5028                         FACILITY:  [facility]   PHYSICIAN:  Lubertha Basque. Dalldorf, M.D.DATE OF BIRTH:  July 19, 1951   DATE OF ADMISSION:  09/25/2005  DATE OF DISCHARGE:                                 DISCHARGE SUMMARY   ADMITTING DIAGNOSES:  1.  Right shoulder rotator cuff tear.  2.  Right wrist carpal tunnel syndrome.  3.  Right ankle instability, chronic.  4.  Hypertension.  5.  Diabetes mellitus.  6.  Hyperlipidemia.  7.  Obesity.   DISCHARGE DIAGNOSES:  1.  Right shoulder rotator cuff tear.  2.  Right wrist carpal tunnel syndrome.  3.  Right ankle instability, chronic.  4.  Hypertension.  5.  Diabetes mellitus.  6.  Hyperlipidemia.  7.  Obesity.  8.  Episode of shortness of breath and chest pain, as determined by medical      doctor to be postnasal drip and sinus congestion related.   OPERATION:  1.  Right carpal tunnel release.  2.  Right shoulder rotator cuff repair.  3.  Right ankle Brostrom procedure.   BRIEF HISTORY:  [name] is a 60 year old white female who some time  ago had fallen at her place of employment and injured her ankle and left  rotator cuff. We have proceed with a rotator cuff repair on the left side  which [name] had done well and were waiting for the ankle to be done until the  rotator cuff could be healed enough that [name] could weight-bear on crutches  or walker, but in the meantime [name] had sustained another fall that fractured  her wrist on the right side which required open reduction internal fixation.  At that same fall [name] had torn her right rotator cuff and developed symptoms  of carpal tunnel on the right side, so at that point [name] needed to have a  right rotator cuff repair, a right carpal tunnel release, and then a  Brostrom procedure to fix the instability that was chronic in her right  ankle that was  causing her to fall. Her husband is disabled and is not able  to care for her so that any postoperative care should be rendered at a  skilled nursing facility, as it was on her previous hospitalization.   PERTINENT LABORATORY AND X-RAY FINDING:  Urine normal. Sodium 140, potassium  3.8, chloride at 96, glucose tested numerous times 141, BUN 9, creatinine  0.6, calcium 9.4. Wbc 7.1, hemoglobin 10.9. CK enzymes 272 with a drop to  173. MB bands at 3.6, 1.3. Total cholesterol at 205. Hemoglobin A1c at 6.5.  [name] did have CT angiography of her chest which showed no acute pulmonary  emboli but did show some bilateral lower lobe posterior atelectasis. Chest x-  ray confirmed that as well. Initial chest x-ray was normal.   COURSE IN THE HOSPITAL:  [name] was admitted postoperatively and had Ancef a  gram q.8h. x3 doses, morphine IV as needed, Percocet, Phenergan, physical  therapy for bed-to-chair transfer, to  be only touchdown weightbearing on the  right side and really no weight on the right shoulder or right arm. [name] had  a dietician consult for appropriate diet. Her medications were reconciled.  Her home medications were Actos 45 once a day, Prilosec 20 once a day,  glyburide 5 b.i.d., metformin 500 b.i.d., Levoxyl, Soma 350 one b.i.d.,  Lasix one b.i.d., Wellbutrin 300 one q.a.m., Zetia 10 mg q.h.s. trazodone 50  q.h.s., Lantus 20 units q.h.s., NovoLog sliding scale. During her hospital  stay, all of her wounds were benign and no sign of infection or irritation.  Dressings were changed on her shoulder and on her wrist and ankle prior to  leaving the hospital and they had normal neurovascular status, no sign of  infection or irritation, no drainage. Initially, her lungs were clear, bowel  sounds were normal with no sign of infection. [name] was bed-to-chair, transfer  with skilled nursing facility placement. Because of some difficulty in  shortness of breath, Dr. Neita Carp was called in and after  extensive studies,  lab work, and above-mentioned CT scan, it was determined that [name] had some  sinus drainage, postnasal drip, and there was no sign of a pulmonary emboli.  [name] was on a telemetry floor for a short while and then later put back onto  the orthopedic floor once decided that [name] was stable. A nursing bed became  available and [name] was discharged.   CONDITION ON DISCHARGE:  Improved.   FOLLOW-UP:  Her medication sheets are reconciled with her previous  medications. Her diet is essentially unrestricted but would follow the  dietician's recommendations. [name] can be bed-to-chair transfer, touchdown  weightbearing only on the right leg, and no weight on her right shoulder.  Could do dressing changes daily on her shoulder, wrist, and ankle. We did  add on Percocet one or two q.4-6h. p.r.n. pain. We want her to follow up  with Dr. Jerl Santos in about 1 week, call [phone]; any sign of infection also  call that same number. [name] will be discharged home on the following  medicines:  1.  Actos 45 mg once daily.  2.  Prilosec 20 mg once daily.  3.  Glyburide 5 mg b.i.d.  4.  Metformin 500 mg b.i.d.  5.  Levoxyl 137 mcg a day.  6.  Lasix 40 mg once a day.  7.  Wellbutrin 300 mg once a day.  8.  Zetia 10 mg once daily.  9.  Trazodone 50 mg at night.  10. Lantus insulin 20 units a day.  11. NovoLog sliding scale.  12. Laxative of choice/enema p.r.n.  13. Tylenol two tablets 325 with temperatures greater than 100.5.   Any problems, [name] is to call [phone] and we would be happy to see her much  earlier than that.   CONDITION ON DISCHARGE:  Improved.      Lindwood Qua, P.A.      Lubertha Basque Jerl Santos, M.D.  Electronically Signed    MC/MEDQ  D:  10/01/2005  T:  10/01/2005  Job:  [id number]

## 2011-05-11 NOTE — Op Note (Signed)
[name], [name]            ACCOUNT NO.:  [id number]   MEDICAL RECORD NO.:  [id number]          PATIENT TYPE:  AMB   LOCATION:  DSC                          FACILITY:  [facility]   PHYSICIAN:  Lubertha Basque. Dalldorf, M.D.DATE OF BIRTH:  August 06, 1951   DATE OF PROCEDURE:  11/28/2004  DATE OF DISCHARGE:                                 OPERATIVE REPORT   PREOPERATIVE DIAGNOSES:  1.  Left shoulder impingement.  2.  Left shoulder large rotator cuff tear.   POSTOPERATIVE DIAGNOSES:  1.  Left shoulder impingement.  2.  Left shoulder large rotator cuff tear.   PROCEDURE:  1.  Left shoulder arthroscopic acromioplasty.  2.  Left shoulder open 2-row rotator cuff repair.   ATTENDING SURGEON:  Lubertha Basque. Jerl Santos, M.D.   ASSISTANT:  [name], P.A.   ANESTHESIA:  General and block.   INDICATION FOR PROCEDURE:  The patient is a 60 year old woman who fell  recently due to a foot problem.  She landed on her left side and suffered a  shoulder injury.  She has been unable to bring her arm up from the side  since that time and has had an MRI scan which shows a large retracted  rotator cuff tear.  She is offered operative repair acutely at this point.  Informed operative consent was obtained after discussion of possible  complications of reaction to anesthesia and infection.   DESCRIPTION OF PROCEDURE:  The patient was taken to the operating suite  where a general anesthetic was applied without difficulty.  She was also  given a block in the preanesthesia area.  She was positioned in a beach  chair position and prepped and draped in normal sterile fashion.  After the  administration of preop IV Ancef, an arthroscopy of the left shoulder was  performed through a total of 2 portals.  The glenohumeral joint showed no  degenerative change and the biceps tendon appeared benign throughout its  course in the shoulder.  The rotator cuff appeared to have a large tear seen  from below.  In the  subacromial space, this was confirmed with about a 2.5-  or 3.0-cm tear which was retracted about 2 cm.  She had a prominent  subacromial morphology addressed with an acromioplasty.  This was done with  a bur in the lateral position, followed by transfer of the bur to the  posterior position.  She had fairly mobile tissues, but I felt best  approaching her tear in an open fashion as I wished to do a 2-row repair,  which I find difficult through the scope.  The arthroscopic equipment was  removed.  An anterolateral incision was made of dissection down to the  deltoid, which was split in line with its fibers about a total of 3 cm from  the tip of the acromion distal.  The tear was easily exposed.  I freed this  up with my finger and Metzenbaum scissors, and it easily came over to the  greater tuberosity.  I created a broad bleeding bed of bone with a rongeur  and an arthroscopic bur.  I placed 2  of the 5.5-mm Arthrex anchors near the  greater tuberosity at the edge of the bleeding bed of bone.  I then placed a  more medial identical anchor over the more medial portion of the bleeding  bed of bone.  Two sutures emanated from each of these anchors.  I first  placed the medial row sutures through the rotator cuff in mattress fashion  at appropriate position.  I then placed the 4 sutures from the lateral row  through the rotator cuff and tied these in simple fashion, reapproximating  the rotator cuff to the area of the greater tuberosity and the bleeding bed  of bone.  I then tied the medial row on top of the rotator cuff in mattress  fashion.  We achieved a nice tight repair and she maintained full motion.  There was no undue tension on the repair with her arm at her side.  The  wound was thoroughly irrigated, followed by reapproximation of the deep  fascia and superficial fascial tissues with 0 Vicryl suture.  Undyed 2-0  Vicryl was used to reapproximate subcutaneous tissues followed by skin   closure with a subcuticular suture and an adhesive device.  Adaptic was  placed over the wounds, followed by dry gauze and tape.  We did also close  her arthroscopic portals with nylon.  Estimated blood loss and  intraoperative fluids can be obtained from anesthesia records.   DISPOSITION:  The patient was extubated in the operating room and taken to  the recovery room in stable condition.  Plans were for her to go home the  same day and to follow up in the office in less than a week.  I will contact  her by phone tonight.      [name]   PGD/MEDQ  D:  11/28/2004  T:  11/28/2004  Job:  [id number]

## 2019-08-27 ENCOUNTER — Ambulatory Visit: Payer: Self-pay | Admitting: Physician Assistant

## 2019-08-27 NOTE — H&P (Signed)
TOTAL KNEE ADMISSION H&P  Patient is being admitted for right total knee arthroplasty.  Subjective:  Chief Complaint:right knee pain.  HPI: [name], 68 y.o. female, has a history of pain and functional disability in the right knee due to arthritis and has failed non-surgical conservative treatments for greater than 12 weeks to includeNSAID's and/or analgesics, corticosteriod injections, viscosupplementation injections and activity modification.  Onset of symptoms was gradual, starting 5 years ago with gradually worsening course since that time. The patient noted no past surgery on the right knee(s).  Patient currently rates pain in the right knee(s) at 8 out of 10 with activity. Patient has night pain, worsening of pain with activity and weight bearing, pain that interferes with activities of daily living, pain with passive range of motion, crepitus and joint swelling.  Patient has evidence of periarticular osteophytes and joint space narrowing by imaging studies.  There is no active infection.  There are no active problems to display for this patient.  No past medical history on file.   DM, GERD, HLD, hypothyroid  Meds: Acyclovir, omeprazole, rosuvastatin, lantus solostar, trazodone, escitalopram, wellbutrin, levothyroxine  Not on File  Social History   Tobacco Use  . Smoking status: Not on file  Substance Use Topics  . Alcohol use: Not on file    No family history on file.   Review of Systems  Musculoskeletal: Positive for joint pain.  All other systems reviewed and are negative.   Objective:  Physical Exam  Constitutional: She is oriented to person, place, and time. She appears well-developed and well-nourished. No distress.  HENT:  Head: Normocephalic and atraumatic.  Nose: Nose normal.  Eyes: Pupils are equal, round, and reactive to light. Conjunctivae and EOM are normal.  Neck: Normal range of motion. Neck supple.  Cardiovascular: Normal rate, regular  rhythm, normal heart sounds and intact distal pulses.  Respiratory: Effort normal and breath sounds normal. No respiratory distress. She has no wheezes.  GI: Soft. Bowel sounds are normal. She exhibits no distension. There is no abdominal tenderness.  Musculoskeletal:     Right knee: She exhibits swelling and bony tenderness. She exhibits normal range of motion, no effusion and no erythema. Tenderness found.  Lymphadenopathy:    She has no cervical adenopathy.  Neurological: She is alert and oriented to person, place, and time. No cranial nerve deficit.  Skin: Skin is warm and dry. No rash noted. No erythema.  Psychiatric: She has a normal mood and affect. Her behavior is normal.    Vital signs in last 24 hours: @VSRANGES @  Labs:   There is no height or weight on file to calculate BMI.   Imaging Review Plain radiographs demonstrate moderate degenerative joint disease of the right knee(s). The overall alignment ismild valgus. The bone quality appears to be good for age and reported activity level.      Assessment/Plan:  End stage arthritis, right knee   The patient history, physical examination, clinical judgment of the provider and imaging studies are consistent with end stage degenerative joint disease of the right knee(s) and total knee arthroplasty is deemed medically necessary. The treatment options including medical management, injection therapy arthroscopy and arthroplasty were discussed at length. The risks and benefits of total knee arthroplasty were presented and reviewed. The risks due to aseptic loosening, infection, stiffness, patella tracking problems, thromboembolic complications and other imponderables were discussed. The patient acknowledged the explanation, agreed to proceed with the plan and consent was signed. Patient is being admitted for  inpatient treatment for surgery, pain control, PT, OT, prophylactic antibiotics, VTE prophylaxis, progressive ambulation and  ADL's and discharge planning. The patient is planning to be discharged home with home health services    Anticipated LOS equal to or greater than 2 midnights due to - Age 10 and older with one or more of the following:  - Obesity  - Expected need for hospital services (PT, OT, Nursing) required for safe  discharge  - Anticipated need for postoperative skilled nursing care or inpatient rehab  - Active co-morbidities: Diabetes OR   - Unanticipated findings during/Post Surgery: None  - Patient is a high risk of re-admission due to: None

## 2019-08-27 NOTE — H&P (View-Only) (Signed)
TOTAL KNEE ADMISSION H&P  Patient is being admitted for right total knee arthroplasty.  Subjective:  Chief Complaint:right knee pain.  HPI: [name], 68 y.o. female, has a history of pain and functional disability in the right knee due to arthritis and has failed non-surgical conservative treatments for greater than 12 weeks to includeNSAID's and/or analgesics, corticosteriod injections, viscosupplementation injections and activity modification.  Onset of symptoms was gradual, starting 5 years ago with gradually worsening course since that time. The patient noted no past surgery on the right knee(s).  Patient currently rates pain in the right knee(s) at 8 out of 10 with activity. Patient has night pain, worsening of pain with activity and weight bearing, pain that interferes with activities of daily living, pain with passive range of motion, crepitus and joint swelling.  Patient has evidence of periarticular osteophytes and joint space narrowing by imaging studies.  There is no active infection.  There are no active problems to display for this patient.  No past medical history on file.   DM, GERD, HLD, hypothyroid  Meds: Acyclovir, omeprazole, rosuvastatin, lantus solostar, trazodone, escitalopram, wellbutrin, levothyroxine  Not on File  Social History   Tobacco Use  . Smoking status: Not on file  Substance Use Topics  . Alcohol use: Not on file    No family history on file.   Review of Systems  Musculoskeletal: Positive for joint pain.  All other systems reviewed and are negative.   Objective:  Physical Exam  Constitutional: She is oriented to person, place, and time. She appears well-developed and well-nourished. No distress.  HENT:  Head: Normocephalic and atraumatic.  Nose: Nose normal.  Eyes: Pupils are equal, round, and reactive to light. Conjunctivae and EOM are normal.  Neck: Normal range of motion. Neck supple.  Cardiovascular: Normal rate, regular  rhythm, normal heart sounds and intact distal pulses.  Respiratory: Effort normal and breath sounds normal. No respiratory distress. She has no wheezes.  GI: Soft. Bowel sounds are normal. She exhibits no distension. There is no abdominal tenderness.  Musculoskeletal:     Right knee: She exhibits swelling and bony tenderness. She exhibits normal range of motion, no effusion and no erythema. Tenderness found.  Lymphadenopathy:    She has no cervical adenopathy.  Neurological: She is alert and oriented to person, place, and time. No cranial nerve deficit.  Skin: Skin is warm and dry. No rash noted. No erythema.  Psychiatric: She has a normal mood and affect. Her behavior is normal.    Vital signs in last 24 hours: @VSRANGES @  Labs:   There is no height or weight on file to calculate BMI.   Imaging Review Plain radiographs demonstrate moderate degenerative joint disease of the right knee(s). The overall alignment ismild valgus. The bone quality appears to be good for age and reported activity level.      Assessment/Plan:  End stage arthritis, right knee   The patient history, physical examination, clinical judgment of the provider and imaging studies are consistent with end stage degenerative joint disease of the right knee(s) and total knee arthroplasty is deemed medically necessary. The treatment options including medical management, injection therapy arthroscopy and arthroplasty were discussed at length. The risks and benefits of total knee arthroplasty were presented and reviewed. The risks due to aseptic loosening, infection, stiffness, patella tracking problems, thromboembolic complications and other imponderables were discussed. The patient acknowledged the explanation, agreed to proceed with the plan and consent was signed. Patient is being admitted for  inpatient treatment for surgery, pain control, PT, OT, prophylactic antibiotics, VTE prophylaxis, progressive ambulation and  ADL's and discharge planning. The patient is planning to be discharged home with home health services    Anticipated LOS equal to or greater than 2 midnights due to - Age 28 and older with one or more of the following:  - Obesity  - Expected need for hospital services (PT, OT, Nursing) required for safe  discharge  - Anticipated need for postoperative skilled nursing care or inpatient rehab  - Active co-morbidities: Diabetes OR   - Unanticipated findings during/Post Surgery: None  - Patient is a high risk of re-admission due to: None

## 2019-09-02 NOTE — Patient Instructions (Signed)
DUE TO COVID-19 ONLY ONE VISITOR IS ALLOWED TO COME WITH YOU AND STAY IN THE WAITING ROOM ONLY DURING PRE OP AND PROCEDURE DAY OF SURGERY.  THE 1 VISITOR MAY VISIT WITH YOU AFTER SURGERY IN YOUR PRIVATE ROOM DURING VISITING HOURS ONLY!  YOU NEED TO HAVE A COVID 19 TEST ON___9/15____ @_______ , THIS TEST MUST BE DONE BEFORE SURGERY, COME  [address] [place] , [address].  ([place])  ONCE YOUR COVID TEST IS COMPLETED, PLEASE BEGIN THE QUARANTINE INSTRUCTIONS AS OUTLINED IN YOUR HANDOUT.                [name]    Your procedure is scheduled on: Friday 09/11/19   Report to [facility] Main  Entrance   Report  Short Stay at 5:30 AM     Call this number if you have problems the morning of surgery [place], NO CHEWING GUM [place].   Do not eat food After Midnight.   YOU MAY HAVE CLEAR LIQUIDS FROM MIDNIGHT UNTIL 4:30AM.   At 4:30AM Please finish the prescribed Pre-Surgery Gatorade drink.   Nothing by mouth after you finish the Gatorade drink !   Take these medicines the morning of surgery with A SIP OF WATER: Lexapro, Acyclovir, Synthroid,Prilosec  DO NOT TAKE ANY DIABETIC MEDICATIONS DAY OF YOUR SURGERY      How to Manage Your Diabetes Before and After Surgery  Why is it important to control my blood sugar before and after surgery? . Improving blood sugar levels before and after surgery helps healing and can limit problems. . A way of improving blood sugar control is eating a healthy diet by: o  Eating less sugar and carbohydrates o  Increasing activity/exercise o  Talking with your doctor about reaching your blood sugar goals . High blood sugars (greater than 180 mg/dL) can raise your risk of infections and slow your recovery, so you will need to focus on controlling your diabetes during the weeks before surgery. . Make sure that the doctor who takes care of  your diabetes knows about your planned surgery including the date and location.  How do I manage my blood sugar before surgery? . Check your blood sugar at least 4 times a day, starting 2 days before surgery, to make sure that the level is not too high or low. o Check your blood sugar the morning of your surgery when you wake up and every 2 hours until you get to the Short Stay unit. . If your blood sugar is less than 70 mg/dL, you will need to treat for low blood sugar: o Do not take insulin. o Treat a low blood sugar (less than 70 mg/dL) with  cup of clear juice (cranberry or apple), 4 glucose tablets, OR glucose gel. o Recheck blood sugar in 15 minutes after treatment (to make sure it is greater than 70 mg/dL). If your blood sugar is not greater than 70 mg/dL on recheck, call [phone] for further instructions. . Report your blood sugar to the short stay nurse when you get to Short Stay.  . If you are admitted to the hospital after surgery: o Your blood sugar will be checked by the staff and you will probably be given insulin after surgery (instead of oral diabetes medicines) to make sure you have good blood sugar levels. o The goal for blood sugar control after  surgery is 80-180 mg/dL.   WHAT DO I DO ABOUT MY DIABETES MEDICATION?  Marland Kitchen Do not take oral diabetes medicines (pills) the morning of surgery.  The day before surgery you may take 100% of your AM dose of insulin.  . THE NIGHT BEFORE SURGERY, do not take your after supper dose of   insulin.       . THE MORNING OF SURGERY, do not take   insulin.  . The day of surgery, do not take other diabetes injectables, including Byetta (exenatide), Bydureon (exenatide ER), Victoza (liraglutide), or Trulicity (dulaglutide).                   You may not have any metal on your body including hair pins and              piercings               Do not wear jewelry, make-up, lotions, powders or perfumes, deodorant             Do not wear  nail polish.  Do not shave  48 hours prior to surgery.               Do not bring valuables to the hospital. [place].  Contacts, dentures or bridgework may not be worn into surgery.  .      Name and phone number of your driver:  Special Instructions: N/A              Please read over the following fact sheets you were given: _____________________________________________________________________             [facility] - Preparing for Surgery  Before surgery, you can play an important role.   Because skin is not sterile, your skin needs to be as free of germs as possible.   You can reduce the number of germs on your skin by washing with CHG (chlorahexidine gluconate) soap before surgery.   CHG is an antiseptic cleaner which kills germs and bonds with the skin to continue killing germs even after washing. Please DO NOT use if you have an allergy to CHG or antibacterial soaps.   If your skin becomes reddened/irritated stop using the CHG and inform your nurse when you arrive at Short Stay. Do not shave (including legs and underarms) for at least 48 hours prior to the first CHG shower.    Please follow these instructions carefully:  1.  Shower with CHG Soap the night before surgery and the  morning of Surgery.  2.  If you choose to wash your hair, wash your hair first as usual with your  normal  shampoo.  3.  After you shampoo, rinse your hair and body thoroughly to remove the  shampoo.                                        4.  Use CHG as you would any other liquid soap.  You can apply chg directly  to the skin and wash                       Gently with a scrungie or clean washcloth.  5.  Apply the CHG Soap to your body ONLY FROM THE  NECK DOWN.   Do not use on face/ open                           Wound or open sores. Avoid contact with eyes, ears mouth and genitals (private parts).                       Wash face,  Genitals (private  parts) with your normal soap.             6.  Wash thoroughly, paying special attention to the area where your surgery  will be performed.  7.  Thoroughly rinse your body with warm water from the neck down.  8.  DO NOT shower/wash with your normal soap after using and rinsing off  the CHG Soap.                9.  Pat yourself dry with a clean towel.            10.  Wear clean pajamas.            11.  Place clean sheets on your bed the night of your first shower and do not  sleep with pets. Day of Surgery : Do not apply any lotions/deodorants the morning of surgery.  Please wear clean clothes to the hospital/surgery center.  FAILURE TO FOLLOW THESE INSTRUCTIONS MAY RESULT IN THE CANCELLATION OF YOUR SURGERY PATIENT SIGNATURE_________________________________  NURSE SIGNATURE__________________________________  ________________________________________________________________________   Adam Phenix  An incentive spirometer is a tool that can help keep your lungs clear and active. This tool measures how well you are filling your lungs with each breath. Taking long deep breaths may help reverse or decrease the chance of developing breathing (pulmonary) problems (especially infection) following:  A long period of time when you are unable to move or be active. BEFORE THE PROCEDURE   If the spirometer includes an indicator to show your best effort, your nurse or respiratory therapist will set it to a desired goal.  If possible, sit up straight or lean slightly forward. Try not to slouch.  Hold the incentive spirometer in an upright position. INSTRUCTIONS FOR USE  1. Sit on the edge of your bed if possible, or sit up as far as you can in bed or on a chair. 2. Hold the incentive spirometer in an upright position. 3. Breathe out normally. 4. Place the mouthpiece in your mouth and seal your lips tightly around it. 5. Breathe in slowly and as deeply as possible, raising the piston or the  ball toward the top of the column. 6. Hold your breath for 3-5 seconds or for as long as possible. Allow the piston or ball to fall to the bottom of the column. 7. Remove the mouthpiece from your mouth and breathe out normally. 8. Rest for a few seconds and repeat Steps 1 through 7 at least 10 times every 1-2 hours when you are awake. Take your time and take a few normal breaths between deep breaths. 9. The spirometer may include an indicator to show your best effort. Use the indicator as a goal to work toward during each repetition. 10. After each set of 10 deep breaths, practice coughing to be sure your lungs are clear. If you have an incision (the cut made at the time of surgery), support your incision when coughing by placing a pillow or rolled up towels firmly against it. Once you are  able to get out of bed, walk around indoors and cough well. You may stop using the incentive spirometer when instructed by your caregiver.  RISKS AND COMPLICATIONS  Take your time so you do not get dizzy or light-headed.  If you are in pain, you may need to take or ask for pain medication before doing incentive spirometry. It is harder to take a deep breath if you are having pain. AFTER USE  Rest and breathe slowly and easily.  It can be helpful to keep track of a log of your progress. Your caregiver can provide you with a simple table to help with this. If you are using the spirometer at home, follow these instructions: [place] IF:   You are having difficultly using the spirometer.  You have trouble using the spirometer as often as instructed.  Your pain medication is not giving enough relief while using the spirometer.  You develop fever of 100.5 F (38.1 C) or higher. SEEK IMMEDIATE MEDICAL CARE IF:   You cough up bloody sputum that had not been present before.  You develop fever of 102 F (38.9 C) or greater.  You develop worsening pain at or near the incision site. MAKE SURE YOU:    Understand these instructions.  Will watch your condition.  Will get help right away if you are not doing well or get worse. Document Released: 04/22/2007 Document Revised: 03/03/2012 Document Reviewed: 06/23/2007 [facility] Patient Information 2014 [place], [place].   ________________________________________________________________________

## 2019-09-03 ENCOUNTER — Encounter (HOSPITAL_COMMUNITY)
Admission: RE | Admit: 2019-09-03 | Discharge: 2019-09-03 | Disposition: A | Discharge status: Home / Self Care | Payer: Medicare Other | Source: Ambulatory Visit | Attending: Orthopedic Surgery | Admitting: Orthopedic Surgery

## 2019-09-03 ENCOUNTER — Encounter (HOSPITAL_COMMUNITY): Admission: RE | Admit: 2019-09-03 | Payer: Medicare Other | Source: Ambulatory Visit

## 2019-09-03 ENCOUNTER — Other Ambulatory Visit: Payer: Self-pay

## 2019-09-03 ENCOUNTER — Encounter (INDEPENDENT_AMBULATORY_CARE_PROVIDER_SITE_OTHER): Payer: Self-pay

## 2019-09-03 ENCOUNTER — Encounter (HOSPITAL_COMMUNITY): Payer: Self-pay

## 2019-09-03 DIAGNOSIS — R001 Bradycardia, unspecified: Secondary | ICD-10-CM | POA: Diagnosis present

## 2019-09-03 DIAGNOSIS — E119 Type 2 diabetes mellitus without complications: Secondary | ICD-10-CM | POA: Insufficient documentation

## 2019-09-03 DIAGNOSIS — Z01818 Encounter for other preprocedural examination: Secondary | ICD-10-CM | POA: Diagnosis present

## 2019-09-03 HISTORY — DX: Malignant (primary) neoplasm, unspecified: C80.1

## 2019-09-03 HISTORY — DX: Chronic obstructive pulmonary disease, unspecified: J44.9

## 2019-09-03 HISTORY — DX: Type 2 diabetes mellitus without complications: E11.9

## 2019-09-03 HISTORY — DX: Depression, unspecified: F32.A

## 2019-09-03 HISTORY — DX: Unspecified osteoarthritis, unspecified site: M19.90

## 2019-09-03 HISTORY — DX: Hypothyroidism, unspecified: E03.9

## 2019-09-03 HISTORY — DX: Myoneural disorder, unspecified: G70.9

## 2019-09-03 HISTORY — DX: Anemia, unspecified: D64.9

## 2019-09-03 HISTORY — DX: Anxiety disorder, unspecified: F41.9

## 2019-09-03 LAB — CBC WITH DIFFERENTIAL/PLATELET
Abs Immature Granulocytes: 0.02 10*3/uL (ref 0.00–0.07)
Basophils Absolute: 0 10*3/uL (ref 0.0–0.1)
Basophils Relative: 1 %
Eosinophils Absolute: 0.1 10*3/uL (ref 0.0–0.5)
Eosinophils Relative: 1 %
HCT: 39.7 % (ref 36.0–46.0)
Hemoglobin: 12.5 g/dL (ref 12.0–15.0)
Immature Granulocytes: 0 %
Lymphocytes Relative: 25 %
Lymphs Abs: 1.3 10*3/uL (ref 0.7–4.0)
MCH: 27.4 pg (ref 26.0–34.0)
MCHC: 31.5 g/dL (ref 30.0–36.0)
MCV: 86.9 fL (ref 80.0–100.0)
Monocytes Absolute: 0.5 10*3/uL (ref 0.1–1.0)
Monocytes Relative: 10 %
Neutro Abs: 3.3 10*3/uL (ref 1.7–7.7)
Neutrophils Relative %: 63 %
Platelets: 222 10*3/uL (ref 150–400)
RBC: 4.57 MIL/uL (ref 3.87–5.11)
RDW: 13.3 % (ref 11.5–15.5)
WBC: 5.2 10*3/uL (ref 4.0–10.5)
nRBC: 0 % (ref 0.0–0.2)

## 2019-09-03 LAB — COMPREHENSIVE METABOLIC PANEL
ALT: 15 U/L (ref 0–44)
AST: 19 U/L (ref 15–41)
Albumin: 3.9 g/dL (ref 3.5–5.0)
Alkaline Phosphatase: 27 U/L — ABNORMAL LOW (ref 38–126)
Anion gap: 6 (ref 5–15)
BUN: 14 mg/dL (ref 8–23)
CO2: 29 mmol/L (ref 22–32)
Calcium: 9.5 mg/dL (ref 8.9–10.3)
Chloride: 104 mmol/L (ref 98–111)
Creatinine, Ser: 0.69 mg/dL (ref 0.44–1.00)
GFR calc Af Amer: >60 mL/min (ref >60)
GFR calc non Af Amer: >60 mL/min (ref >60)
Glucose, Bld: 105 mg/dL — ABNORMAL HIGH (ref 70–99)
Potassium: 4.2 mmol/L (ref 3.5–5.1)
Sodium: 139 mmol/L (ref 135–145)
Total Bilirubin: 0.7 mg/dL (ref 0.3–1.2)
Total Protein: 7.1 g/dL (ref 6.5–8.1)

## 2019-09-03 LAB — URINALYSIS, ROUTINE W REFLEX MICROSCOPIC
Bilirubin Urine: NEGATIVE
Glucose, UA: NEGATIVE mg/dL
Hgb urine dipstick: NEGATIVE
Ketones, ur: NEGATIVE mg/dL
Nitrite: NEGATIVE
Protein, ur: NEGATIVE mg/dL
Specific Gravity, Urine: 1.021 (ref 1.005–1.030)
pH: 5 (ref 5.0–8.0)

## 2019-09-03 LAB — SURGICAL PCR SCREEN
MRSA, PCR: NEGATIVE
Staphylococcus aureus: POSITIVE — AB

## 2019-09-03 LAB — GLUCOSE, CAPILLARY: Glucose-Capillary: 106 mg/dL — ABNORMAL HIGH (ref 70–99)

## 2019-09-03 LAB — PROTIME-INR
INR: 1.1 (ref 0.8–1.2)
Prothrombin Time: 14.1 seconds (ref 11.4–15.2)

## 2019-09-03 LAB — APTT: aPTT: 32 seconds (ref 24–36)

## 2019-09-04 LAB — HEMOGLOBIN A1C
Hgb A1c MFr Bld: 7.4 % — ABNORMAL HIGH (ref 4.8–5.6)
Mean Plasma Glucose: 166 mg/dL

## 2019-09-04 LAB — URINE CULTURE: Culture: 60000 — AB

## 2019-09-04 LAB — ABO/RH: ABO/RH(D): A POS

## 2019-09-08 ENCOUNTER — Other Ambulatory Visit (HOSPITAL_COMMUNITY)
Admission: RE | Admit: 2019-09-08 | Discharge: 2019-09-08 | Disposition: A | Discharge status: Home / Self Care | Payer: Medicare Other | Source: Ambulatory Visit | Attending: Orthopedic Surgery | Admitting: Orthopedic Surgery

## 2019-09-08 DIAGNOSIS — Z01812 Encounter for preprocedural laboratory examination: Secondary | ICD-10-CM | POA: Insufficient documentation

## 2019-09-08 DIAGNOSIS — Z20828 Contact with and (suspected) exposure to other viral communicable diseases: Secondary | ICD-10-CM | POA: Insufficient documentation

## 2019-09-09 LAB — NOVEL CORONAVIRUS, NAA (HOSP ORDER, SEND-OUT TO REF LAB; TAT 18-24 HRS): SARS-CoV-2, NAA: NOT DETECTED

## 2019-09-10 MED ORDER — TRANEXAMIC ACID 1000 MG/10ML IV SOLN
2000.0000 mg | INTRAVENOUS | Status: DC
  Filled 2019-09-10: qty 20

## 2019-09-10 MED ORDER — BUPIVACAINE LIPOSOME 1.3 % IJ SUSP
20.0000 mL | Freq: Once | INTRAMUSCULAR | Status: DC
  Filled 2019-09-10: qty 20

## 2019-09-11 ENCOUNTER — Inpatient Hospital Stay (HOSPITAL_COMMUNITY)
Admission: RE | Admit: 2019-09-11 | Discharge: 2019-09-15 | DRG: 470 | Disposition: A | Discharge status: Home Health | Payer: Medicare Other | Attending: Orthopedic Surgery | Admitting: Orthopedic Surgery

## 2019-09-11 ENCOUNTER — Encounter (HOSPITAL_COMMUNITY): Payer: Self-pay | Admitting: Emergency Medicine

## 2019-09-11 ENCOUNTER — Encounter (HOSPITAL_COMMUNITY)
Admission: RE | Disposition: A | Discharge status: Home / Self Care | Payer: Self-pay | Source: Other Acute Inpatient Hospital | Attending: Orthopedic Surgery

## 2019-09-11 ENCOUNTER — Inpatient Hospital Stay (HOSPITAL_COMMUNITY): Payer: Medicare Other | Admitting: Physician Assistant

## 2019-09-11 ENCOUNTER — Inpatient Hospital Stay (HOSPITAL_COMMUNITY): Payer: Medicare Other | Admitting: Anesthesiology

## 2019-09-11 ENCOUNTER — Other Ambulatory Visit: Payer: Self-pay

## 2019-09-11 DIAGNOSIS — J449 Chronic obstructive pulmonary disease, unspecified: Secondary | ICD-10-CM | POA: Diagnosis not present

## 2019-09-11 DIAGNOSIS — M1711 Unilateral primary osteoarthritis, right knee: Secondary | ICD-10-CM | POA: Diagnosis present

## 2019-09-11 DIAGNOSIS — Z87891 Personal history of nicotine dependence: Secondary | ICD-10-CM | POA: Diagnosis not present

## 2019-09-11 DIAGNOSIS — E785 Hyperlipidemia, unspecified: Secondary | ICD-10-CM | POA: Diagnosis not present

## 2019-09-11 DIAGNOSIS — Z794 Long term (current) use of insulin: Secondary | ICD-10-CM

## 2019-09-11 DIAGNOSIS — Z79890 Hormone replacement therapy (postmenopausal): Secondary | ICD-10-CM

## 2019-09-11 DIAGNOSIS — E039 Hypothyroidism, unspecified: Secondary | ICD-10-CM | POA: Diagnosis not present

## 2019-09-11 DIAGNOSIS — Z79899 Other long term (current) drug therapy: Secondary | ICD-10-CM | POA: Diagnosis not present

## 2019-09-11 DIAGNOSIS — K219 Gastro-esophageal reflux disease without esophagitis: Secondary | ICD-10-CM | POA: Diagnosis present

## 2019-09-11 DIAGNOSIS — Z20828 Contact with and (suspected) exposure to other viral communicable diseases: Secondary | ICD-10-CM | POA: Diagnosis present

## 2019-09-11 DIAGNOSIS — E119 Type 2 diabetes mellitus without complications: Secondary | ICD-10-CM | POA: Diagnosis present

## 2019-09-11 HISTORY — PX: TOTAL KNEE ARTHROPLASTY: SHX125

## 2019-09-11 LAB — GLUCOSE, CAPILLARY
Glucose-Capillary: 109 mg/dL — ABNORMAL HIGH (ref 70–99)
Glucose-Capillary: 130 mg/dL — ABNORMAL HIGH (ref 70–99)
Glucose-Capillary: 228 mg/dL — ABNORMAL HIGH (ref 70–99)
Glucose-Capillary: 243 mg/dL — ABNORMAL HIGH (ref 70–99)

## 2019-09-11 LAB — TYPE AND SCREEN
ABO/RH(D): A POS
Antibody Screen: NEGATIVE

## 2019-09-11 SURGERY — ARTHROPLASTY, KNEE, TOTAL
Anesthesia: Monitor Anesthesia Care | Site: Knee | Laterality: Right

## 2019-09-11 MED ORDER — VANCOMYCIN HCL 1000 MG IV SOLR
INTRAVENOUS | Status: DC | PRN
  Administered 2019-09-11: 1000 mg via INTRAVENOUS

## 2019-09-11 MED ORDER — PROPOFOL 10 MG/ML IV BOLUS
INTRAVENOUS | Status: AC
  Filled 2019-09-11: qty 60

## 2019-09-11 MED ORDER — BUPIVACAINE-EPINEPHRINE (PF) 0.5% -1:200000 IJ SOLN
INTRAMUSCULAR | Status: AC
  Filled 2019-09-11: qty 30

## 2019-09-11 MED ORDER — FENTANYL CITRATE (PF) 100 MCG/2ML IJ SOLN
INTRAMUSCULAR | Status: AC
  Filled 2019-09-11: qty 2

## 2019-09-11 MED ORDER — ACETAMINOPHEN 500 MG PO TABS
1000.0000 mg | ORAL_TABLET | Freq: Four times a day (QID) | ORAL | Status: AC
  Administered 2019-09-11 – 2019-09-12 (×4): 1000 mg via ORAL
  Filled 2019-09-11 (×4): qty 2

## 2019-09-11 MED ORDER — DEXAMETHASONE SODIUM PHOSPHATE 10 MG/ML IJ SOLN
INTRAMUSCULAR | Status: AC
  Filled 2019-09-11: qty 1

## 2019-09-11 MED ORDER — INSULIN ASPART 100 UNIT/ML ~~LOC~~ SOLN
0.0000 [IU] | Freq: Every day | SUBCUTANEOUS | Status: DC
  Administered 2019-09-11: 2 [IU] via SUBCUTANEOUS

## 2019-09-11 MED ORDER — HYDROMORPHONE HCL 1 MG/ML IJ SOLN
0.5000 mg | INTRAMUSCULAR | Status: DC | PRN
  Administered 2019-09-11 – 2019-09-12 (×4): 0.5 mg via INTRAVENOUS
  Filled 2019-09-11 (×4): qty 0.5

## 2019-09-11 MED ORDER — FENTANYL CITRATE (PF) 100 MCG/2ML IJ SOLN
25.0000 ug | INTRAMUSCULAR | Status: DC | PRN
  Administered 2019-09-11 (×3): 50 ug via INTRAVENOUS

## 2019-09-11 MED ORDER — LACTATED RINGERS IV SOLN
INTRAVENOUS | Status: DC
  Administered 2019-09-11 (×2): via INTRAVENOUS

## 2019-09-11 MED ORDER — VANCOMYCIN HCL 1000 MG IV SOLR
INTRAVENOUS | Status: DC | PRN
  Administered 2019-09-11: 1000 mg

## 2019-09-11 MED ORDER — BISACODYL 10 MG RE SUPP: 10.0000 mg | Freq: Every day | RECTAL | Status: DC | PRN

## 2019-09-11 MED ORDER — PROPOFOL 10 MG/ML IV BOLUS
INTRAVENOUS | Status: AC
  Filled 2019-09-11: qty 40

## 2019-09-11 MED ORDER — FLUTICASONE PROPIONATE 50 MCG/ACT NA SUSP: 1.0000 | Freq: Every day | NASAL | Status: DC | PRN

## 2019-09-11 MED ORDER — MIDAZOLAM HCL 5 MG/5ML IJ SOLN
INTRAMUSCULAR | Status: DC | PRN
  Administered 2019-09-11 (×2): 1 mg via INTRAVENOUS

## 2019-09-11 MED ORDER — DIPHENHYDRAMINE HCL 12.5 MG/5ML PO ELIX
12.5000 mg | ORAL_SOLUTION | ORAL | Status: DC | PRN
  Administered 2019-09-12: 25 mg via ORAL
  Filled 2019-09-11: qty 10

## 2019-09-11 MED ORDER — FLEET ENEMA 7-19 GM/118ML RE ENEM
1.0000 | ENEMA | Freq: Once | RECTAL | Status: AC | PRN
  Administered 2019-09-14: 1 via RECTAL
  Filled 2019-09-11: qty 1

## 2019-09-11 MED ORDER — MENTHOL 3 MG MT LOZG: 1.0000 | LOZENGE | OROMUCOSAL | Status: DC | PRN

## 2019-09-11 MED ORDER — VANCOMYCIN HCL IN DEXTROSE 1-5 GM/200ML-% IV SOLN
1000.0000 mg | Freq: Two times a day (BID) | INTRAVENOUS | Status: AC
  Administered 2019-09-11: 1000 mg via INTRAVENOUS
  Filled 2019-09-11: qty 200

## 2019-09-11 MED ORDER — SODIUM CHLORIDE 0.9 % IR SOLN
Status: DC | PRN
  Administered 2019-09-11: 1000 mL

## 2019-09-11 MED ORDER — POLYETHYLENE GLYCOL 3350 17 G PO PACK
17.0000 g | PACK | Freq: Every day | ORAL | Status: DC | PRN
  Administered 2019-09-14 – 2019-09-15 (×2): 17 g via ORAL
  Filled 2019-09-11 (×2): qty 1

## 2019-09-11 MED ORDER — DEXAMETHASONE SODIUM PHOSPHATE 10 MG/ML IJ SOLN
INTRAMUSCULAR | Status: DC | PRN
  Administered 2019-09-11: 8 mg via INTRAVENOUS

## 2019-09-11 MED ORDER — ONDANSETRON HCL 4 MG PO TABS: 4.0000 mg | ORAL_TABLET | Freq: Three times a day (TID) | ORAL | 1 refills | Status: AC | PRN

## 2019-09-11 MED ORDER — BUPIVACAINE LIPOSOME 1.3 % IJ SUSP
INTRAMUSCULAR | Status: DC | PRN
  Administered 2019-09-11: 10 mL

## 2019-09-11 MED ORDER — SODIUM CHLORIDE 0.9 % IV SOLN
INTRAVENOUS | Status: AC | PRN
  Administered 2019-09-11: 1000 mL

## 2019-09-11 MED ORDER — PHENYLEPHRINE 40 MCG/ML (10ML) SYRINGE FOR IV PUSH (FOR BLOOD PRESSURE SUPPORT)
PREFILLED_SYRINGE | INTRAVENOUS | Status: DC | PRN
  Administered 2019-09-11: 120 ug via INTRAVENOUS

## 2019-09-11 MED ORDER — SODIUM CHLORIDE (PF) 0.9 % IJ SOLN
INTRAMUSCULAR | Status: AC
  Filled 2019-09-11: qty 50

## 2019-09-11 MED ORDER — DOCUSATE SODIUM 100 MG PO CAPS
100.0000 mg | ORAL_CAPSULE | Freq: Two times a day (BID) | ORAL | Status: DC
  Administered 2019-09-11 – 2019-09-15 (×8): 100 mg via ORAL
  Filled 2019-09-11 (×8): qty 1

## 2019-09-11 MED ORDER — ACYCLOVIR 400 MG PO TABS
400.0000 mg | ORAL_TABLET | Freq: Two times a day (BID) | ORAL | Status: DC
  Administered 2019-09-11 – 2019-09-15 (×8): 400 mg via ORAL
  Filled 2019-09-11 (×8): qty 1

## 2019-09-11 MED ORDER — DOCUSATE SODIUM 100 MG PO CAPS: 100.0000 mg | ORAL_CAPSULE | Freq: Every day | ORAL | 2 refills | Status: AC | PRN

## 2019-09-11 MED ORDER — INSULIN ASPART 100 UNIT/ML ~~LOC~~ SOLN
4.0000 [IU] | Freq: Three times a day (TID) | SUBCUTANEOUS | Status: DC
  Administered 2019-09-11 – 2019-09-15 (×13): 4 [IU] via SUBCUTANEOUS

## 2019-09-11 MED ORDER — PANTOPRAZOLE SODIUM 40 MG PO TBEC
40.0000 mg | DELAYED_RELEASE_TABLET | Freq: Every day | ORAL | Status: DC
  Administered 2019-09-11 – 2019-09-15 (×5): 40 mg via ORAL
  Filled 2019-09-11 (×5): qty 1

## 2019-09-11 MED ORDER — ACETAMINOPHEN 325 MG PO TABS
325.0000 mg | ORAL_TABLET | Freq: Four times a day (QID) | ORAL | Status: DC | PRN
  Administered 2019-09-13: 650 mg via ORAL
  Filled 2019-09-11: qty 2

## 2019-09-11 MED ORDER — OXYCODONE HCL 5 MG PO TABS
5.0000 mg | ORAL_TABLET | ORAL | Status: DC | PRN
  Administered 2019-09-11 – 2019-09-15 (×12): 10 mg via ORAL
  Filled 2019-09-11 (×13): qty 2

## 2019-09-11 MED ORDER — BUPROPION HCL ER (XL) 300 MG PO TB24
300.0000 mg | ORAL_TABLET | Freq: Every day | ORAL | Status: DC
  Administered 2019-09-11 – 2019-09-14 (×4): 300 mg via ORAL
  Filled 2019-09-11 (×4): qty 1

## 2019-09-11 MED ORDER — HYDROMORPHONE HCL 1 MG/ML IJ SOLN
0.5000 mg | INTRAMUSCULAR | Status: DC | PRN
  Administered 2019-09-11: 0.5 mg via INTRAVENOUS

## 2019-09-11 MED ORDER — ONDANSETRON HCL 4 MG PO TABS: 4.0000 mg | ORAL_TABLET | Freq: Four times a day (QID) | ORAL | Status: DC | PRN

## 2019-09-11 MED ORDER — SODIUM CHLORIDE 0.9 % IV SOLN
INTRAVENOUS | Status: DC
  Administered 2019-09-11 – 2019-09-13 (×3): via INTRAVENOUS

## 2019-09-11 MED ORDER — PHENYLEPHRINE 40 MCG/ML (10ML) SYRINGE FOR IV PUSH (FOR BLOOD PRESSURE SUPPORT)
PREFILLED_SYRINGE | INTRAVENOUS | Status: AC
  Filled 2019-09-11: qty 20

## 2019-09-11 MED ORDER — CHLORHEXIDINE GLUCONATE 4 % EX LIQD: 60.0000 mL | Freq: Once | CUTANEOUS | Status: DC

## 2019-09-11 MED ORDER — PROPOFOL 500 MG/50ML IV EMUL
INTRAVENOUS | Status: DC | PRN
  Administered 2019-09-11: 80 ug/kg/min via INTRAVENOUS

## 2019-09-11 MED ORDER — ROSUVASTATIN CALCIUM 10 MG PO TABS
10.0000 mg | ORAL_TABLET | Freq: Every day | ORAL | Status: DC
  Administered 2019-09-11 – 2019-09-14 (×4): 10 mg via ORAL
  Filled 2019-09-11 (×4): qty 1

## 2019-09-11 MED ORDER — ONDANSETRON HCL 4 MG/2ML IJ SOLN
INTRAMUSCULAR | Status: AC
  Filled 2019-09-11: qty 2

## 2019-09-11 MED ORDER — ONDANSETRON HCL 4 MG/2ML IJ SOLN: 4.0000 mg | Freq: Once | INTRAMUSCULAR | Status: DC | PRN

## 2019-09-11 MED ORDER — HYDROMORPHONE HCL 1 MG/ML IJ SOLN
INTRAMUSCULAR | Status: AC
  Filled 2019-09-11: qty 1

## 2019-09-11 MED ORDER — FENTANYL CITRATE (PF) 100 MCG/2ML IJ SOLN
INTRAMUSCULAR | Status: DC | PRN
  Administered 2019-09-11 (×2): 50 ug via INTRAVENOUS

## 2019-09-11 MED ORDER — PROPOFOL 10 MG/ML IV BOLUS
INTRAVENOUS | Status: DC | PRN
  Administered 2019-09-11: 30 mg via INTRAVENOUS

## 2019-09-11 MED ORDER — PHENOL 1.4 % MT LIQD: 1.0000 | OROMUCOSAL | Status: DC | PRN

## 2019-09-11 MED ORDER — INSULIN ASPART 100 UNIT/ML ~~LOC~~ SOLN
0.0000 [IU] | Freq: Three times a day (TID) | SUBCUTANEOUS | Status: DC
  Administered 2019-09-11 – 2019-09-12 (×2): 5 [IU] via SUBCUTANEOUS
  Administered 2019-09-12: 3 [IU] via SUBCUTANEOUS
  Administered 2019-09-12: 15:00:00 5 [IU] via SUBCUTANEOUS
  Administered 2019-09-13: 3 [IU] via SUBCUTANEOUS
  Administered 2019-09-13: 2 [IU] via SUBCUTANEOUS
  Administered 2019-09-13: 3 [IU] via SUBCUTANEOUS
  Administered 2019-09-14: 2 [IU] via SUBCUTANEOUS
  Administered 2019-09-14 – 2019-09-15 (×3): 3 [IU] via SUBCUTANEOUS
  Administered 2019-09-15: 5 [IU] via SUBCUTANEOUS
  Administered 2019-09-15: 3 [IU] via SUBCUTANEOUS

## 2019-09-11 MED ORDER — OXYCODONE HCL 5 MG PO TABS: ORAL_TABLET | ORAL | 0 refills | Status: DC

## 2019-09-11 MED ORDER — SODIUM CHLORIDE 0.9% FLUSH
INTRAVENOUS | Status: DC | PRN
  Administered 2019-09-11: 25 mL

## 2019-09-11 MED ORDER — TRANEXAMIC ACID-NACL 1000-0.7 MG/100ML-% IV SOLN
1000.0000 mg | Freq: Once | INTRAVENOUS | Status: AC
  Administered 2019-09-11: 1000 mg via INTRAVENOUS
  Filled 2019-09-11: qty 100

## 2019-09-11 MED ORDER — ONDANSETRON HCL 4 MG/2ML IJ SOLN
4.0000 mg | Freq: Four times a day (QID) | INTRAMUSCULAR | Status: DC | PRN
  Administered 2019-09-12 – 2019-09-13 (×3): 4 mg via INTRAVENOUS
  Filled 2019-09-11 (×3): qty 2

## 2019-09-11 MED ORDER — ONDANSETRON HCL 4 MG/2ML IJ SOLN
INTRAMUSCULAR | Status: DC | PRN
  Administered 2019-09-11: 4 mg via INTRAVENOUS

## 2019-09-11 MED ORDER — CEFAZOLIN SODIUM-DEXTROSE 2-4 GM/100ML-% IV SOLN
2.0000 g | INTRAVENOUS | Status: AC
  Administered 2019-09-11: 2 g via INTRAVENOUS
  Filled 2019-09-11: qty 100

## 2019-09-11 MED ORDER — VANCOMYCIN HCL IN DEXTROSE 1-5 GM/200ML-% IV SOLN
INTRAVENOUS | Status: AC
  Filled 2019-09-11: qty 200

## 2019-09-11 MED ORDER — POVIDONE-IODINE 10 % EX SWAB
2.0000 "application " | Freq: Once | CUTANEOUS | Status: AC
  Administered 2019-09-11: 2 via TOPICAL

## 2019-09-11 MED ORDER — VANCOMYCIN HCL 1000 MG IV SOLR
INTRAVENOUS | Status: AC
  Filled 2019-09-11: qty 1000

## 2019-09-11 MED ORDER — METOCLOPRAMIDE HCL 5 MG PO TABS: 5.0000 mg | ORAL_TABLET | Freq: Three times a day (TID) | ORAL | Status: DC | PRN

## 2019-09-11 MED ORDER — TRANEXAMIC ACID 1000 MG/10ML IV SOLN
INTRAVENOUS | Status: DC | PRN
  Administered 2019-09-11: 2000 mg via TOPICAL

## 2019-09-11 MED ORDER — MIDAZOLAM HCL 2 MG/2ML IJ SOLN
INTRAMUSCULAR | Status: AC
  Filled 2019-09-11: qty 2

## 2019-09-11 MED ORDER — ASPIRIN EC 81 MG PO TBEC: 81.0000 mg | DELAYED_RELEASE_TABLET | Freq: Two times a day (BID) | ORAL | 0 refills | Status: AC

## 2019-09-11 MED ORDER — WATER FOR IRRIGATION, STERILE IR SOLN
Status: DC | PRN
  Administered 2019-09-11 (×2): 1000 mL

## 2019-09-11 MED ORDER — METOCLOPRAMIDE HCL 5 MG/ML IJ SOLN
5.0000 mg | Freq: Three times a day (TID) | INTRAMUSCULAR | Status: DC | PRN
  Administered 2019-09-12 – 2019-09-13 (×2): 10 mg via INTRAVENOUS
  Filled 2019-09-11 (×2): qty 2

## 2019-09-11 MED ORDER — ESCITALOPRAM OXALATE 10 MG PO TABS
5.0000 mg | ORAL_TABLET | Freq: Every day | ORAL | Status: DC
  Administered 2019-09-12 – 2019-09-15 (×4): 5 mg via ORAL
  Filled 2019-09-11 (×4): qty 1

## 2019-09-11 MED ORDER — ASPIRIN 81 MG PO CHEW
81.0000 mg | CHEWABLE_TABLET | Freq: Two times a day (BID) | ORAL | Status: DC
  Administered 2019-09-11 – 2019-09-15 (×8): 81 mg via ORAL
  Filled 2019-09-11 (×8): qty 1

## 2019-09-11 MED ORDER — TRANEXAMIC ACID-NACL 1000-0.7 MG/100ML-% IV SOLN
1000.0000 mg | INTRAVENOUS | Status: AC
  Administered 2019-09-11: 1000 mg via INTRAVENOUS
  Filled 2019-09-11: qty 100

## 2019-09-11 MED ORDER — BUPIVACAINE-EPINEPHRINE 0.5% -1:200000 IJ SOLN
INTRAMUSCULAR | Status: DC | PRN
  Administered 2019-09-11: 15 mL

## 2019-09-11 MED ORDER — LEVOTHYROXINE SODIUM 112 MCG PO TABS
112.0000 ug | ORAL_TABLET | Freq: Every day | ORAL | Status: DC
  Administered 2019-09-12 – 2019-09-15 (×4): 112 ug via ORAL
  Filled 2019-09-11 (×4): qty 1

## 2019-09-11 MED ORDER — SODIUM CHLORIDE 0.9 % IV SOLN: INTRAVENOUS | Status: DC

## 2019-09-11 MED ORDER — BUPIVACAINE IN DEXTROSE 0.75-8.25 % IT SOLN
INTRATHECAL | Status: DC | PRN
  Administered 2019-09-11: 1.8 mL via INTRATHECAL

## 2019-09-11 SURGICAL SUPPLY — 66 items
ANCH SUT 2 CP-2 EBND QANCHR+ (Anchor) ×2 IMPLANT
ANCHOR SUPER QUICK (Anchor) ×2 IMPLANT
APL SKNCLS STERI-STRIP NONHPOA (GAUZE/BANDAGES/DRESSINGS) ×1
ATTUNE PS FEM RT SZ 5 CEM KNEE (Femur) ×1 IMPLANT
ATTUNE PSRP INSR SZ5 8 KNEE (Insert) ×1 IMPLANT
BAG DECANTER FOR FLEXI CONT (MISCELLANEOUS) ×2 IMPLANT
BAG SPEC THK2 15X12 ZIP CLS (MISCELLANEOUS) ×1
BAG ZIPLOCK 12X15 (MISCELLANEOUS) ×2 IMPLANT
BASE TIBIAL ROT PLAT SZ 5 KNEE (Knees) IMPLANT
BENZOIN TINCTURE PRP APPL 2/3 (GAUZE/BANDAGES/DRESSINGS) ×2 IMPLANT
BLADE SAGITTAL 25.0X1.19X90 (BLADE) ×2 IMPLANT
BLADE SAW SGTL 13X75X1.27 (BLADE) ×2 IMPLANT
BLADE SURG 15 STRL LF DISP TIS (BLADE) ×1 IMPLANT
BLADE SURG 15 STRL SS (BLADE) ×2
BLADE SURG SZ10 CARB STEEL (BLADE) ×6 IMPLANT
BNDG CMPR MED 15X6 ELC VLCR LF (GAUZE/BANDAGES/DRESSINGS) ×1
BNDG ELASTIC 6X15 VLCR STRL LF (GAUZE/BANDAGES/DRESSINGS) ×2 IMPLANT
BOWL SMART MIX CTS (DISPOSABLE) ×2 IMPLANT
BSPLAT TIB 5 CMNT ROT PLAT STR (Knees) ×1 IMPLANT
CEMENT HV SMART SET (Cement) ×2 IMPLANT
CLSR STERI-STRIP ANTIMIC 1/2X4 (GAUZE/BANDAGES/DRESSINGS) ×3 IMPLANT
COVER SURGICAL LIGHT HANDLE (MISCELLANEOUS) ×2 IMPLANT
COVER WAND RF STERILE (DRAPES) ×2 IMPLANT
CUFF TOURN SGL QUICK 34 (TOURNIQUET CUFF) ×2
CUFF TRNQT CYL 34X4.125X (TOURNIQUET CUFF) ×1 IMPLANT
DECANTER SPIKE VIAL GLASS SM (MISCELLANEOUS) ×4 IMPLANT
DRAPE INCISE IOBAN 66X45 STRL (DRAPES) IMPLANT
DRAPE ORTHO SPLIT 77X108 STRL (DRAPES) ×4
DRAPE SURG ORHT 6 SPLT 77X108 (DRAPES) ×2 IMPLANT
DRAPE U-SHAPE 47X51 STRL (DRAPES) ×2 IMPLANT
DRESSING AQUACEL AG SP 3.5X10 (GAUZE/BANDAGES/DRESSINGS) ×1 IMPLANT
DRSG AQUACEL AG SP 3.5X10 (GAUZE/BANDAGES/DRESSINGS) ×2
DURAPREP 26ML APPLICATOR (WOUND CARE) ×4 IMPLANT
ELECT REM PT RETURN 15FT ADLT (MISCELLANEOUS) ×2 IMPLANT
GLOVE BIOGEL PI IND STRL 8 (GLOVE) ×2 IMPLANT
GLOVE BIOGEL PI INDICATOR 8 (GLOVE) ×2
GLOVE SURG ORTHO 8.0 STRL STRW (GLOVE) ×2 IMPLANT
GLOVE SURG SS PI 7.5 STRL IVOR (GLOVE) ×2 IMPLANT
GOWN STRL REUS W/TWL XL LVL3 (GOWN DISPOSABLE) ×4 IMPLANT
HANDPIECE INTERPULSE COAX TIP (DISPOSABLE) ×2
HOLDER FOLEY CATH W/STRAP (MISCELLANEOUS) IMPLANT
HOOD PEEL AWAY FLYTE STAYCOOL (MISCELLANEOUS) ×2 IMPLANT
IMMOBILIZER KNEE 20 (SOFTGOODS) ×2
IMMOBILIZER KNEE 20 THIGH 36 (SOFTGOODS) ×1 IMPLANT
KIT TURNOVER KIT A (KITS) IMPLANT
MANIFOLD NEPTUNE II (INSTRUMENTS) ×2 IMPLANT
NEEDLE HYPO 22GX1.5 SAFETY (NEEDLE) ×4 IMPLANT
NS IRRIG 1000ML POUR BTL (IV SOLUTION) ×2 IMPLANT
PACK ICE MAXI GEL EZY WRAP (MISCELLANEOUS) ×2 IMPLANT
PACK TOTAL KNEE CUSTOM (KITS) ×2 IMPLANT
PATELLA MEDIAL ATTUN 35MM KNEE (Knees) ×1 IMPLANT
PIN FIX SIGMA LCS THRD HI (PIN) ×1 IMPLANT
PIN STEINMAN FIXATION KNEE (PIN) ×1 IMPLANT
PROTECTOR NERVE ULNAR (MISCELLANEOUS) ×2 IMPLANT
SET HNDPC FAN SPRY TIP SCT (DISPOSABLE) ×1 IMPLANT
SUT ETHIBOND NAB CT1 #1 30IN (SUTURE) ×6 IMPLANT
SUT MNCRL AB 3-0 PS2 18 (SUTURE) ×2 IMPLANT
SUT VIC AB 0 CT1 36 (SUTURE) ×2 IMPLANT
SUT VIC AB 2-0 CT1 27 (SUTURE) ×4
SUT VIC AB 2-0 CT1 TAPERPNT 27 (SUTURE) ×2 IMPLANT
SYR CONTROL 10ML LL (SYRINGE) ×4 IMPLANT
TIBIAL BASE ROT PLAT SZ 5 KNEE (Knees) ×2 IMPLANT
TRAY FOLEY MTR SLVR 16FR STAT (SET/KITS/TRAYS/PACK) ×2 IMPLANT
WATER STERILE IRR 1000ML POUR (IV SOLUTION) ×4 IMPLANT
WRAP KNEE MAXI GEL POST OP (GAUZE/BANDAGES/DRESSINGS) ×1 IMPLANT
YANKAUER SUCT BULB TIP 10FT TU (MISCELLANEOUS) ×2 IMPLANT

## 2019-09-11 NOTE — Anesthesia Postprocedure Evaluation (Signed)
Anesthesia Post Note  Patient: [name]  Procedure(s) Performed: TOTAL KNEE ARTHROPLASTY (Right Knee)     Patient location during evaluation: PACU Anesthesia Type: MAC Level of consciousness: awake and alert Pain management: pain level controlled Vital Signs Assessment: post-procedure vital signs reviewed and stable Respiratory status: spontaneous breathing, nonlabored ventilation, respiratory function stable and patient connected to nasal cannula oxygen Cardiovascular status: blood pressure returned to baseline and stable Postop Assessment: no apparent nausea or vomiting Anesthetic complications: no    Last Vitals:  Vitals:   09/11/19 1445 09/11/19 1459  BP:  (!) 167/67  Pulse:  66  Resp:  16  Temp:  36.5 C  SpO2: 98% 98%    Last Pain:  Vitals:   09/11/19 1459  TempSrc: Oral  PainSc: 6                  Romesha Scherer COKER

## 2019-09-11 NOTE — Anesthesia Postprocedure Evaluation (Signed)
Anesthesia Post Note  Patient: [name]  Procedure(s) Performed: TOTAL KNEE ARTHROPLASTY (Right Knee)     Anesthesia Post Evaluation  Last Vitals:  Vitals:   09/11/19 1445 09/11/19 1459  BP:  (!) 167/67  Pulse:  66  Resp:  16  Temp:  36.5 C  SpO2: 98% 98%    Last Pain:  Vitals:   09/11/19 1459  TempSrc: Oral  PainSc: 6                  Naziya Hegwood COKER

## 2019-09-11 NOTE — Transfer of Care (Signed)
Immediate Anesthesia Transfer of Care Note  Patient: [name]  Procedure(s) Performed: TOTAL KNEE ARTHROPLASTY (Right Knee)  Patient Location: PACU  Anesthesia Type:Spinal  Level of Consciousness: awake, alert  and oriented  Airway & Oxygen Therapy: Patient Spontanous Breathing and Patient connected to face mask oxygen  Post-op Assessment: Report given to RN and Post -op Vital signs reviewed and stable  Post vital signs: Reviewed and stable  Last Vitals:  Vitals Value Taken Time  BP    Temp    Pulse 67 09/11/19 1024  Resp 13 09/11/19 1024  SpO2 100 % 09/11/19 1024  Vitals shown include unvalidated device data.  Last Pain:  Vitals:   09/11/19 0626  PainSc: 3       Patients Stated Pain Goal: 4 ([id number])  Complications: No apparent anesthesia complications

## 2019-09-11 NOTE — Anesthesia Preprocedure Evaluation (Addendum)
Anesthesia Evaluation  Patient identified by MRN, date of birth, ID band Patient awake    Reviewed: Allergy & Precautions, NPO status , Patient's Chart, lab work & pertinent test results  Airway Mallampati: II  TM Distance: >3 FB Neck ROM: Full    Dental  (+) Edentulous Upper, Edentulous Lower   Pulmonary former smoker,    breath sounds clear to auscultation       Cardiovascular  Rhythm:Regular Rate:Normal     Neuro/Psych    GI/Hepatic   Endo/Other  diabetes  Renal/GU      Musculoskeletal   Abdominal (+) + obese,   Peds  Hematology   Anesthesia Other Findings   Reproductive/Obstetrics                            Anesthesia Physical Anesthesia Plan  ASA: III  Anesthesia Plan: MAC and Spinal   Post-op Pain Management:  Regional for Post-op pain   Induction:   PONV Risk Score and Plan:   Airway Management Planned: Natural Airway and Simple Face Mask  Additional Equipment:   Intra-op Plan:   Post-operative Plan:   Informed Consent: I have reviewed the patients History and Physical, chart, labs and discussed the procedure including the risks, benefits and alternatives for the proposed anesthesia with the patient or authorized representative who has indicated his/her understanding and acceptance.       Plan Discussed with: CRNA and Anesthesiologist  Anesthesia Plan Comments:         Anesthesia Quick Evaluation

## 2019-09-11 NOTE — Interval H&P Note (Signed)
History and Physical Interval Note:  09/11/2019 7:29 AM  [name]  has presented today for surgery, with the diagnosis of OA RIGHT KNEE.  The various methods of treatment have been discussed with the patient and family. After consideration of risks, benefits and other options for treatment, the patient has consented to  Procedure(s): TOTAL KNEE ARTHROPLASTY (Right) as a surgical intervention.  The patient's history has been reviewed, patient examined, no change in status, stable for surgery.  I have reviewed the patient's chart and labs.  Questions were answered to the patient's satisfaction.     Yvette Rack

## 2019-09-11 NOTE — Brief Op Note (Signed)
09/11/2019  10:23 AM  PATIENT:  [name]  68 y.o. female  PRE-OPERATIVE DIAGNOSIS:  OA RIGHT KNEE  POST-OPERATIVE DIAGNOSIS:  OA RIGHT KNEE  PROCEDURE:  Procedure(s): TOTAL KNEE ARTHROPLASTY (Right)  SURGEON:  Surgeon(s) and Role:    Earlie Server, MD - Primary  PHYSICIAN ASSISTANT: Chriss Czar, PA-C  ASSISTANTS: OR staff x1   ANESTHESIA:   local, regional and spinal  EBL:  minimal   BLOOD ADMINISTERED:none  DRAINS: none   LOCAL MEDICATIONS USED:  MARCAINE     SPECIMEN:  No Specimen  DISPOSITION OF SPECIMEN:  N/A  COUNTS:  YES  TOURNIQUET:   Total Tourniquet Time Documented: Thigh (Right) - 83 minutes Total: Thigh (Right) - 83 minutes   DICTATION: .Other Dictation: Dictation Number unknown  PLAN OF CARE: Admit to inpatient   PATIENT DISPOSITION:  PACU - hemodynamically stable.   Delay start of Pharmacological VTE agent (>24hrs) due to surgical blood loss or risk of bleeding: yes

## 2019-09-11 NOTE — Anesthesia Procedure Notes (Signed)
Spinal  Patient location during procedure: OR Start time: 09/11/2019 8:39 AM Staffing Resident/CRNA: [place] Florencia Zaccaro C, CRNA Performed: resident/CRNA  Preanesthetic Checklist Completed: patient identified, site marked, surgical consent, pre-op evaluation, timeout performed, IV checked, risks and benefits discussed and monitors and equipment checked Spinal Block Patient position: sitting Prep: DuraPrep Patient monitoring: heart rate, cardiac monitor, continuous pulse ox and blood pressure Approach: midline Location: L3-4 Injection technique: single-shot Needle Needle type: Pencan  Needle gauge: 24 G Needle length: 9 cm Assessment Sensory level: T4 Additional Notes IV functioning, monitors applied to pt. Expiration date of kit checked and confirmed to be in date. Sterile prep and drape, hand hygiene and sterile gloved used. Pt was positioned and spine was prepped in sterile fashion. Skin was anesthetized with lidocaine. Free flow of clear CSF obtained prior to injecting local anesthetic into CSF x 1 attempt. Spinal needle aspirated freely following injection. Needle was carefully withdrawn, and pt tolerated procedure well. Loss of motor and sensory on exam post injection.

## 2019-09-11 NOTE — Op Note (Signed)
NAME: [name], GARRON MEDICAL RECORD [id number] ACCOUNT [id number] DATE OF BIRTH:23-Mar-1951 FACILITY: [facility] LOCATION: [facility] PHYSICIAN:W. 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  OPERATION:  Right total knee replacement (Attune size 5 femur, 5 tibia, 35 mm dome patella with 8 mm tibial insert).  SURGEON:  Vangie Bicker, MD  ASSISTANTMarjo Bicker  ANESTHESIA:  Spinal with adductor block.  TOURNIQUET TIME:  81 minutes.  DESCRIPTION OF PROCEDURE:  Sterile prep and drape.  The patient had an extensive central incision that we did use, although we had to extend it.  She had had a previous surgery that looking in the knee was a partial patellectomy made, and the patella was  somewhat small.  There was significant amount of scar tissue encountered.  We carefully dissected the medial side of the knee minimally as she did have a valgus deformity.  We went with a standard medial parapatellar approach to the knee.  Distal  femoral cut with a 9 mm 5-degree valgus cut.  We then did a minimal cut on the tibia due to the significant amount of bone deformity and eventually re-cut the tibia to what initially accepted just a 5 mm bearing, but in the end, we ended up with an 8 mm  bearing thicknesses.  The all-in-1 cutting block was placed with the shim as a rotation guide.  It appeared not to rotate the knee sufficiently.  There was a significant amount of external rotation of the femur.  We then placed it in about 5 degrees of  external rotation so as to minimize any problem with patellar tracking.  We then cut the anterior, posterior, and chamfer cuts, released the PCL and osteophytes from the posterior aspect of the knee.  Again, the  flexion gap and extension gap eventually  was at 8.  There was slightly more tension on the lateral side than the medial side, but we thought the optimal combination of joint mechanics relative to stability and obtaining a good extension was 8 mm.  A keel hole was cut for the tibia as well as  the box cut for the femur, followed by placement of all trials.  We had cut the patella early to minimize any undue trauma to the patella tendon attachment.  Again, left about 15 mm of native patella for a 35 mm all trials.  The final components were  inserted, tibia followed by femur patella with the trial bearings.  Cement was allowed to harden.  We did carefully protect the attachment of the patellar tendon, but due to the significant amount of scar tissue and deformity, the tendon was not avulsed,  but to lessen any chance for an avulsion postoperatively, we placed 2 Mitek anchors to reinforce the patellar tendon attachment.  Closure was effected after the final bearing was approached with #1 Ethibond, subcutaneous tissues with Vicryl, and the  skin was closed as well.  A lightly compressive sterile dressing and knee immobilizer applied, taken to recovery room in stable condition.  LN/NUANCE  D:09/11/2019 T:09/11/2019 JOB:[id number]

## 2019-09-11 NOTE — Evaluation (Signed)
Physical Therapy Evaluation Patient Details Name: [name] MRN: [id number] DOB: 1951/11/19 Today's Date: 09/11/2019   History of Present Illness  Pt is 68 y.o. female s/p Rt TKA on 09/11/19. She has PMH significant for DM, CPOD, cancer, multiple fractures, and anxety.    Clinical Impression  MERIA RICHHART is a 69 y.o. female POD 0 s/p Rt TKA. Patient reports modified independence with RW or SPC for mobility at baseline due to knee pain. Patient is now limited by functional impairments (see PT problem list below) and requires min assist for transfers and gait with RW. Patient was able to ambulate ~60 feet with RW and min assist and requires encouragement due to anxiety. Patient instructed in exercise to facilitate ROM and circulation to manage edema. Patient will benefit from continued skilled PT interventions to address impairments and progress towards PLOF. Acute PT will follow to progress mobility and stair training in preparation for safe discharge home.     Follow Up Recommendations Follow surgeon's recommendation for DC plan and follow-up therapies    Equipment Recommendations  None recommended by PT    Recommendations for Other Services       Precautions / Restrictions Precautions Precautions: Fall Restrictions Weight Bearing Restrictions: No RLE Weight Bearing: Weight bearing as tolerated      Mobility  Bed Mobility Overal bed mobility: Needs Assistance Bed Mobility: Supine to Sit     Supine to sit: Min assist     General bed mobility comments: cues for sequencing hand placement and assist for Rt LE  Transfers Overall transfer level: Needs assistance Equipment used: Rolling walker (2 wheeled) Transfers: Sit to/from Stand Sit to Stand: Min assist         General transfer comment: cues for safe hand placement on RW, min assist to initiate power up  Ambulation/Gait Ambulation/Gait assistance: Min assist Gait Distance (Feet): 60 Feet Assistive  device: Rolling walker (2 wheeled) Gait Pattern/deviations: Step-to pattern;Decreased stride length;Antalgic;Decreased weight shift to right;Decreased step length - left;Decreased step length - right Gait velocity: decreased   General Gait Details: pt required cues throughout to remind her of safe hand placement, pt with step to pattern, reliant on UE support to reduce WB in Rt LE  Stairs            Wheelchair Mobility    Modified Rankin (Stroke Patients Only)       Balance Overall balance assessment: Needs assistance Sitting-balance support: Feet supported;No upper extremity supported Sitting balance-Leahy Scale: Fair     Standing balance support: Bilateral upper extremity supported;During functional activity Standing balance-Leahy Scale: Poor Standing balance comment: pt reliant on UE support for standing balance                             Pertinent Vitals/Pain Pain Assessment: 0-10 Pain Score: 4  Pain Location: Rt knee Pain Descriptors / Indicators: Aching Pain Intervention(s): Limited activity within patient's tolerance;Ice applied;Monitored during session    [place] expects to be discharged to:: Private residence Living Arrangements: Spouse/significant other;Children(granddaughter lives with them) Available Help at Discharge: Family;Available 24 hours/day Type of Home: House Home Access: Ramped entrance;Stairs to enter Entrance Stairs-Rails: None Entrance Stairs-Number of Steps: 2 steps at teh side with a post/bar in the brick wall, no railings; ramp at front door that she gets pushed up with w/c Home Layout: One level Home Equipment: Walker - 2 wheels;Shower seat;Bedside commode;Cane - single point  Prior Function Level of Independence: Independent with assistive device(s)         Comments: pt used RW or cane when knee pain was severe     Hand Dominance        Extremity/Trunk Assessment   Upper Extremity  Assessment Upper Extremity Assessment: Overall WFL for tasks assessed    Lower Extremity Assessment Lower Extremity Assessment: Generalized weakness;RLE deficits/detail RLE Deficits / Details: pt with good quad activaiton with quad set and SLR in supine, no buckling in WB    Cervical / Trunk Assessment Cervical / Trunk Assessment: Normal  Communication   Communication: No difficulties  Cognition Arousal/Alertness: Awake/alert Behavior During Therapy: WFL for tasks assessed/performed Overall Cognitive Status: Within Functional Limits for tasks assessed                                        General Comments      Exercises Total Joint Exercises Ankle Circles/Pumps: AROM;10 reps;Seated;Both Quad Sets: AROM;5 reps;Supine;Right   Assessment/Plan    PT Assessment Patient needs continued PT services  PT Problem List Decreased strength;Decreased balance;Decreased range of motion;Decreased mobility;Decreased activity tolerance;Decreased knowledge of use of DME;Decreased safety awareness       PT Treatment Interventions DME instruction;Balance training;Patient/family education;Functional mobility training;Gait training;Therapeutic activities;Stair training;Therapeutic exercise;Modalities    PT Goals (Current goals can be found in the Care Plan section)  Acute Rehab PT Goals Patient Stated Goal: non stated by patient, pt anxious and discussed goal of feeling safe with RW PT Goal Formulation: With patient Time For Goal Achievement: 09/18/19 Potential to Achieve Goals: Good    Frequency 7X/week    AM-PAC PT "6 Clicks" Mobility  Outcome Measure Help needed turning from your back to your side while in a flat bed without using bedrails?: A Little Help needed moving from lying on your back to sitting on the side of a flat bed without using bedrails?: A Little Help needed moving to and from a bed to a chair (including a wheelchair)?: A Little Help needed standing up  from a chair using your arms (e.g., wheelchair or bedside chair)?: A Little Help needed to walk in hospital room?: A Little Help needed climbing 3-5 steps with a railing? : A Lot 6 Click Score: 17    End of Session Equipment Utilized During Treatment: Gait belt Activity Tolerance: Patient tolerated treatment well Patient left: in chair;with call bell/phone within reach;with chair alarm set Nurse Communication: Mobility status PT Visit Diagnosis: Unsteadiness on feet (R26.81);Other abnormalities of gait and mobility (R26.89)    Time: [id number] PT Time Calculation (min) (ACUTE ONLY): 30 min   Charges:   PT Evaluation $PT Eval Low Complexity: 1 Low PT Treatments $Gait Training: 8-22 mins        Kipp Brood, PT, DPT, [facility] Physical Therapist with [facility]  09/11/2019 6:13 PM

## 2019-09-11 NOTE — Addendum Note (Signed)
Addendum  created 09/11/19 1547 by Roberts Gaudy, MD   Clinical Note Signed

## 2019-09-11 NOTE — Discharge Instructions (Signed)

## 2019-09-12 LAB — BASIC METABOLIC PANEL
Anion gap: 7 (ref 5–15)
BUN: 12 mg/dL (ref 8–23)
CO2: 27 mmol/L (ref 22–32)
Calcium: 8.8 mg/dL — ABNORMAL LOW (ref 8.9–10.3)
Chloride: 101 mmol/L (ref 98–111)
Creatinine, Ser: 0.59 mg/dL (ref 0.44–1.00)
GFR calc Af Amer: >60 mL/min (ref >60)
GFR calc non Af Amer: >60 mL/min (ref >60)
Glucose, Bld: 196 mg/dL — ABNORMAL HIGH (ref 70–99)
Potassium: 4.2 mmol/L (ref 3.5–5.1)
Sodium: 135 mmol/L (ref 135–145)

## 2019-09-12 LAB — GLUCOSE, CAPILLARY
Glucose-Capillary: 231 mg/dL — ABNORMAL HIGH (ref 70–99)
Glucose-Capillary: 235 mg/dL — ABNORMAL HIGH (ref 70–99)
Glucose-Capillary: 168 mg/dL — ABNORMAL HIGH (ref 70–99)

## 2019-09-12 LAB — CBC
HCT: 33.7 % — ABNORMAL LOW (ref 36.0–46.0)
Hemoglobin: 10.6 g/dL — ABNORMAL LOW (ref 12.0–15.0)
MCH: 27.5 pg (ref 26.0–34.0)
MCHC: 31.5 g/dL (ref 30.0–36.0)
MCV: 87.5 fL (ref 80.0–100.0)
Platelets: 155 10*3/uL (ref 150–400)
RBC: 3.85 MIL/uL — ABNORMAL LOW (ref 3.87–5.11)
RDW: 13.2 % (ref 11.5–15.5)
WBC: 7.6 10*3/uL (ref 4.0–10.5)
nRBC: 0 % (ref 0.0–0.2)

## 2019-09-12 MED ORDER — ALPRAZOLAM 0.25 MG PO TABS
0.2500 mg | ORAL_TABLET | Freq: Two times a day (BID) | ORAL | Status: AC
  Administered 2019-09-12 – 2019-09-13 (×4): 0.25 mg via ORAL
  Filled 2019-09-12 (×4): qty 1

## 2019-09-12 NOTE — Progress Notes (Signed)
Subjective: 1 Day Post-Op Procedure(s) (LRB): TOTAL KNEE ARTHROPLASTY (Right) Patient reports pain as moderate.    Objective: Vital signs in last 24 hours: Temp:  [97.5 F (36.4 C)-98.4 F (36.9 C)] 98.3 F (36.8 C) (09/19 0454) Pulse Rate:  [58-73] 66 (09/19 0454) Resp:  [10-21] 18 (09/19 0454) BP: (103-180)/(57-137) 152/58 (09/19 0454) SpO2:  [94 %-100 %] 94 % (09/19 0454)  Intake/Output from previous day: 09/18 0701 - 09/19 0700 In: 4267.1 [P.O.:1560; I.V.:1957; IV Piggyback:750.1] Out: 3510 [Urine:3310; Blood:200] Intake/Output this shift: No intake/output data recorded.  Recent Labs    09/12/19 0239  HGB 10.6*   Recent Labs    09/12/19 0239  WBC 7.6  RBC 3.85*  HCT 33.7*  PLT 155   Recent Labs    09/12/19 0239  NA 135  K 4.2  CL 101  CO2 27  BUN 12  CREATININE 0.59  GLUCOSE 196*  CALCIUM 8.8*   No results for input(s): LABPT, INR in the last 72 hours.  Neurovascular intact Sensation intact distally Intact pulses distally Dorsiflexion/Plantar flexion intact Incision: scant drainage No cellulitis present   Assessment/Plan: 1 Day Post-Op Procedure(s) (LRB): TOTAL KNEE ARTHROPLASTY (Right) Up with therapy Plan for discharge tomorrow   Anticipated LOS equal to or greater than 2 midnights due to - Age 70 and older with one or more of the following:  - Obesity  - Expected need for hospital services (PT, OT, Nursing) required for safe  discharge  - Anticipated need for postoperative skilled nursing care or inpatient rehab  - Active co-morbidities: Diabetes OR   - Unanticipated findings during/Post Surgery: Slow post-op progression: GI, pain control, mobility  - Patient is a high risk of re-admission due to: Barriers to post-acute care (logistical, no family support in home)    Chriss Czar 09/12/2019, 9:11 AM

## 2019-09-12 NOTE — TOC Initial Note (Signed)
Transition of Care [facility]) - Initial/Assessment Note    Patient Details  Name: [name] MRN: [id number] Date of Birth: 12/20/1951  [facility]) CM/SW Contact:    Joaquin Courts, RN Phone Number: 09/12/2019, 12:34 PM  Clinical Narrative:                 CM spoke with patient at bedside. Patient [facility] up with [facility] for [place]. Reports has rolling walker and 3-in-1 at home.  Expected Discharge Plan: [place] Barriers to Discharge: Continued Medical Work up   Patient Goals and CMS Choice Patient states their goals for this hospitalization and ongoing recovery are:: to go home CMS Medicare.gov Compare Post Acute Care list provided to:: Patient Choice offered to / list presented to : Patient  Expected Discharge Plan and Services Expected Discharge Plan: [place]   Discharge Planning Services: CM Consult Post Acute Care Choice: [place] arrangements for the past 2 months: Single Family Home Expected Discharge Date: 09/12/19               DME Arranged: N/A DME Agency: NA       HH Arranged: PT HH Agency: [facility] (formerly [employer]) Date [place]: 09/12/19 Time [place]: 77 Representative spoke with at [place]: pre arranged in MD office  Prior Living Arrangements/Services Living arrangements for the past 2 months: [place] with:: Spouse Patient language and need for interpreter reviewed:: Yes Do you feel safe going back to the place where you live?: Yes      Need for Family Participation in Patient Care: Yes (Comment) Care giver support system in place?: Yes (comment)   Criminal Activity/Legal Involvement Pertinent to Current Situation/Hospitalization: No - Comment as needed  Activities of Daily Living Home Assistive Devices/Equipment: Dentures (specify type), CBG Meter, Eyeglasses ADL Screening (condition at time of  admission) Patient's cognitive ability adequate to safely complete daily activities?: Yes Is the patient deaf or have difficulty hearing?: No Does the patient have difficulty seeing, even when wearing glasses/contacts?: No Does the patient have difficulty concentrating, remembering, or making decisions?: No Patient able to express need for assistance with ADLs?: Yes Does the patient have difficulty dressing or bathing?: No Independently performs ADLs?: Yes (appropriate for developmental age) Does the patient have difficulty walking or climbing stairs?: Yes Weakness of Legs: Both Weakness of Arms/Hands: Right  Permission Sought/Granted                  Emotional Assessment Appearance:: Appears stated age Attitude/Demeanor/Rapport: Engaged Affect (typically observed): Accepting Orientation: : Oriented to  Time, Oriented to Situation, Oriented to Place, Oriented to Self   Psych Involvement: No (comment)  Admission diagnosis:  OA RIGHT KNEE Patient Active Problem List   Diagnosis Date Noted  . Primary localized osteoarthritis of right knee 09/11/2019   PCP:  Manon Hilding, MD Pharmacy:   [facility], [place] [id number] [place] [id number] Phone: [phone] Fax: [phone]     Social Determinants of Health (SDOH) Interventions    Readmission Risk Interventions No flowsheet data found.
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
## 2019-09-12 NOTE — Progress Notes (Signed)
Pt in CPM because of nausea impeding her ability to get up with PT. Pt fidgety and anxious, unable to lie still. Pt not used to being still. Message sent to PA to inquire about an anti anxiety med. Order for xanax BID received.

## 2019-09-12 NOTE — Plan of Care (Signed)

## 2019-09-12 NOTE — Progress Notes (Signed)
    Home health agencies that serve [id number].        Home Health Agencies Search Results  Results List Table  Home Health Agency Information Quality of Patient Care Rating Patient Survey Summary Rating  [facility] [phone] 4 out of 5 stars 4 out of 5 stars  [facility] [phone] 4  out of 5 stars 3 out of 5 stars  [facility] [phone] 4 out of 5 stars 4 out of 5 stars  [facility] [phone] 4 out of 5 stars 4 out of 5 stars  [facility] [phone] 3  out of 5 stars 4 out of 5 stars  [facility] [phone] 3 out of 5 stars 4 out of 5 stars  [facility] [phone] 3  out of 5 stars 4 out of 5 stars  [facility] [phone] 4  out of 5 stars 3 out of 5 stars   Home Health Footnotes  Footnote number Footnote as displayed on Home Health Compare  1 This agency provides services under a federal waiver program to non-traditional, chronic long term population.  2 This agency provides services to a special needs population.  3 Not Available.  4 The number of patient episodes for this measure is too small to report.  5 This measure currently does not have data or provider has been certified/recertified for less than 6 months.  6 The national average for this measure is not provided because of state-to-state differences in data collection.  7 Medicare is not displaying rates for this measure for any home health agency, because of an issue with the data.  8 There were problems with the data and they are being corrected.  9 Zero, or very few, patients met the survey's rules for inclusion. The scores shown, if any, reflect a very small number of surveys and may not accurately tell how an agency is doing.  10 Survey results are based on less than 12 months of data.  11 Fewer than 70 patients completed the survey. Use the scores shown, if any,  with caution as the number of surveys may be too low to accurately tell how an agency is doing.  12 No survey results are available for this period.  13 Data suppressed by CMS for one or more quarters.    

## 2019-09-12 NOTE — Progress Notes (Signed)
09/12/19 1434  PT Visit Information  Last PT Received On 09/12/19  Assistance Needed +2  History of Present Illness Pt is 68 y.o. female s/p Rt TKA on 09/11/19. She has PMH significant for DM, CPOD, cancer, multiple fractures, and anxety.  Precautions  Precautions Fall  Restrictions  Weight Bearing Restrictions No  RLE Weight Bearing WBAT  Pain Assessment  Pain Assessment Faces  Faces Pain Scale 6  Pain Location Rt knee  Pain Descriptors / Indicators Aching  Cognition  Arousal/Alertness Awake/alert  Behavior During Therapy WFL for tasks assessed/performed  Overall Cognitive Status Within Functional Limits for tasks assessed  Bed Mobility  Overal bed mobility Needs Assistance  Bed Mobility Supine to Sit  Supine to sit Min assist  General bed mobility comments Significantly increased time and effort with heavy reliance on bed rails. Pt using L LE to assist R LE off EOB. Assist for trunk elevation.  Transfers  Overall transfer level Needs assistance  Equipment used Rolling walker (2 wheeled)  Transfers Sit to/from [facility]  Sit to Stand Mod assist;+2 physical assistance  Stand pivot transfers Mod assist  General transfer comment Pt able to stand from EOB with mod A and max encouragement. Once standing pt attempting to pivot to [facility] but unable to move legs once standing. After several min, encouragement, and assist with weight shifting. Pt began to fatigue. The bed was moved backwards and BSC positioned behind pt.  On standing from [facility] RN present and assisting in tranfer. Again, the [facility] was moved out from behind the pt and the bed positioned behind her. Pt required significantly increased time and effort for all transfer activities.  Balance  Overall balance assessment Needs assistance  Sitting-balance support Feet supported;No upper extremity supported  Sitting balance-Leahy Scale Fair  Standing balance support Bilateral upper extremity supported;During functional  activity  Standing balance-Leahy Scale Poor  Standing balance comment pt reliant on UE support for standing balance  General Comments  General comments (skin integrity, edema, etc.) Pt c/o dizziness, nausea, and feeling faint when rising into seated position. BP checked and WNL  PT - End of Session  Equipment Utilized During Treatment Gait belt  Activity Tolerance Treatment limited secondary to medical complications (Comment) (Nausea, dizziness)  Patient left with call bell/phone within reach;in bed  Nurse Communication Mobility status  CPM Right Knee  Additional Comments bone foam   PT - Assessment/Plan  PT Plan Current plan remains appropriate  PT Visit Diagnosis Unsteadiness on feet (R26.81);Other abnormalities of gait and mobility (R26.89)  PT Frequency (ACUTE ONLY) 7X/week  Follow Up Recommendations Follow surgeon's recommendation for DC plan and follow-up therapies  PT equipment None recommended by PT  AM-PAC PT "6 Clicks" Mobility Outcome Measure (Version 2)  Help needed turning from your back to your side while in a flat bed without using bedrails? 3  Help needed moving from lying on your back to sitting on the side of a flat bed without using bedrails? 3  Help needed moving to and from a bed to a chair (including a wheelchair)? 2  Help needed standing up from a chair using your arms (e.g., wheelchair or bedside chair)? 2  Help needed to walk in hospital room? 2  Help needed climbing 3-5 steps with a railing?  1  6 Click Score 13  Consider Recommendation of Discharge To: CIR/SNF/LTACH  PT Goal Progression  Progress towards PT goals Not progressing toward goals - comment (limited by fear, dizziness, nausea, feeling faint)  Acute Rehab PT Goals  PT Goal Formulation With patient  Time For Goal Achievement 09/18/19  Potential to Achieve Goals Good  PT Time Calculation  PT Start Time (ACUTE ONLY) 1321  PT Stop Time (ACUTE ONLY) 1402  PT Time Calculation (min) (ACUTE ONLY) 41  min  PT General Charges  $$ ACUTE PT VISIT 1 Visit  PT Treatments  $Therapeutic Activity 38-52 mins   Pt is making poor progress towards goals at this time. She is limited by nausea, dizziness, and fear of falling. Pt attempted to transfer to [facility] but was unable to pivot once standing. Bed was moved and BSC placed behind pt. RN assisted at end of session. May need to consider SNF placement if pt continues to progress at current rate. Will continue to follow.    Benjiman Core, PTA Pager [phone] Acute Rehab

## 2019-09-12 NOTE — Progress Notes (Signed)
OT Cancellation Note  Patient Details Name: [name] MRN: [id number] DOB: December 05, 1951   Cancelled Treatment:    Reason Eval/Treat Not Completed: Other (comment).  Have checked on pt twice. She was limited by pain and nausea this am and is now resting after xanax and not sleeping well. Will try to return later this afternoon.  [place] 09/12/2019, 1:06 PM  Lesle Chris, OTR/L Acute Rehabilitation Services [phone] [facility] pager [phone] office 09/12/2019

## 2019-09-12 NOTE — Progress Notes (Signed)
Physical Therapy Treatment Patient Details Name: [name] MRN: [id number] DOB: 19-Feb-1951 Today's Date: 09/12/2019    History of Present Illness Pt is 68 y.o. female s/p Rt TKA on 09/11/19. She has PMH significant for DM, CPOD, cancer, multiple fractures, and anxety.    PT Comments    This morning's session limited secondary to pt nausea and reports of "feeling like I'm going to pass out". Pt was able to perform supine HEP before progressing to EOB where she became nauseous and faint requiring return to supine. Pt reports she lives with husband who is disabled and will be unable to assist with supervision or mobility. If d/cing home, pt would benefit from [facility] aid for safety and supervision. Plan to progress gait next session.    Follow Up Recommendations  Follow surgeon's recommendation for DC plan and follow-up therapies     Equipment Recommendations  None recommended by PT    Recommendations for Other Services       Precautions / Restrictions Precautions Precautions: Fall Restrictions Weight Bearing Restrictions: No RLE Weight Bearing: Weight bearing as tolerated    Mobility  Bed Mobility Overal bed mobility: Needs Assistance Bed Mobility: Supine to Sit     Supine to sit: Min assist     General bed mobility comments: cues for sequencing and technique. Min A to elevate trunk.  Transfers                 General transfer comment: unable secondary to nausea and feeling faint  Ambulation/Gait                 Stairs             Wheelchair Mobility    Modified Rankin (Stroke Patients Only)       Balance Overall balance assessment: Needs assistance Sitting-balance support: Feet supported;No upper extremity supported Sitting balance-Leahy Scale: Fair                                      Cognition Arousal/Alertness: Awake/alert Behavior During Therapy: WFL for tasks assessed/performed Overall Cognitive Status: Within  Functional Limits for tasks assessed                                 General Comments: Pt with tangental speech and required frequent VC to stay on task. Hands on assist required for ther ex to encouarge pt to continue to exercises while continuing to talk.      Exercises Total Joint Exercises Ankle Circles/Pumps: AROM;10 reps;Both;Supine Quad Sets: AROM;Right;10 reps;Supine Towel Squeeze: AROM;Both;10 reps;Supine Short Arc Quad: AROM;Right;10 reps;Supine Heel Slides: AAROM;Right;10 reps;Supine Hip ABduction/ADduction: AAROM;Right;10 reps;Supine    General Comments        Pertinent Vitals/Pain Pain Assessment: Faces Faces Pain Scale: Hurts even more Pain Location: Rt knee Pain Descriptors / Indicators: Aching Pain Intervention(s): Monitored during session;Limited activity within patient's tolerance    Home Living                      Prior Function            PT Goals (current goals can now be found in the care plan section) Acute Rehab PT Goals PT Goal Formulation: With patient Time For Goal Achievement: 09/18/19 Potential to Achieve Goals: Good Progress towards PT goals: Progressing toward goals  Frequency    7X/week      PT Plan Current plan remains appropriate    Co-evaluation              AM-PAC PT "6 Clicks" Mobility   Outcome Measure  Help needed turning from your back to your side while in a flat bed without using bedrails?: A Little Help needed moving from lying on your back to sitting on the side of a flat bed without using bedrails?: A Little Help needed moving to and from a bed to a chair (including a wheelchair)?: A Little Help needed standing up from a chair using your arms (e.g., wheelchair or bedside chair)?: A Little Help needed to walk in hospital room?: A Little Help needed climbing 3-5 steps with a railing? : A Lot 6 Click Score: 17    End of Session Equipment Utilized During Treatment: Gait  belt Activity Tolerance: Patient tolerated treatment well Patient left: with call bell/phone within reach;in bed Nurse Communication: Mobility status PT Visit Diagnosis: Unsteadiness on feet (R26.81);Other abnormalities of gait and mobility (R26.89)     Time: [id number] PT Time Calculation (min) (ACUTE ONLY): 45 min  Charges:  $Therapeutic Exercise: 23-37 mins $Therapeutic Activity: 8-22 mins                     Benjiman Core, [place] Pager [phone] Acute Rehab  Allena Katz 09/12/2019, 11:32 AM

## 2019-09-13 LAB — CBC
HCT: 32.5 % — ABNORMAL LOW (ref 36.0–46.0)
Hemoglobin: 10.1 g/dL — ABNORMAL LOW (ref 12.0–15.0)
MCH: 27.4 pg (ref 26.0–34.0)
MCHC: 31.1 g/dL (ref 30.0–36.0)
MCV: 88.1 fL (ref 80.0–100.0)
Platelets: 152 10*3/uL (ref 150–400)
RBC: 3.69 MIL/uL — ABNORMAL LOW (ref 3.87–5.11)
RDW: 13.4 % (ref 11.5–15.5)
WBC: 8 10*3/uL (ref 4.0–10.5)
nRBC: 0 % (ref 0.0–0.2)

## 2019-09-13 LAB — GLUCOSE, CAPILLARY
Glucose-Capillary: 191 mg/dL — ABNORMAL HIGH (ref 70–99)
Glucose-Capillary: 182 mg/dL — ABNORMAL HIGH (ref 70–99)
Glucose-Capillary: 143 mg/dL — ABNORMAL HIGH (ref 70–99)
Glucose-Capillary: 124 mg/dL — ABNORMAL HIGH (ref 70–99)

## 2019-09-13 NOTE — Progress Notes (Signed)
CSW spoke to the pt who stated she desires to go home and would like for the CSW to call the pt's daughter for collateral information. Pt stated that while untrained the pt's granddaughter could care for her and that her brother could pick her up.  Pt stated she would go to a facility, "f I needed to".  CSW called pt's daughter Ria Clock at ph: [phone] and left a VM requesting a call back.  CSW will continue to follow for D/C needs.  Alphonse Guild. Macai Sisneros, LCSW, LCAS, CSI Transitions of Care Clinical Social Worker Care Coordination Department Ph: [phone]

## 2019-09-13 NOTE — Progress Notes (Signed)
CSW called pt's RN at [phone] to seek phone call w/ pt and was updated by RN that PT is recommending another night prior to D/C.  PA updated by CSW and will change D/C order to 9/20 pending PT note results.  Per RN, pt may not be best source of "reliable" collateral info, however per chart pt is own legal guardian and per PA pt answers questions appropriately.  Per PA, family states D/C home is unsafe as pt "may be getting dementia", Pt cares for her husband.  However, PA also notes that per family: Pt has a 89 year old granddaughter that lives in the home and per pt, "I have a brother that may pick me up" and that pt presents as A&OX4.  CSW will continue to follow for D/C needs.  Alphonse Guild. Delma Drone, LCSW, LCAS, CSI Transitions of Care Clinical Social Worker Care Coordination Department Ph: [phone]

## 2019-09-13 NOTE — Progress Notes (Addendum)
Subjective: 2 Days Post-Op Procedure(s) (LRB): TOTAL KNEE ARTHROPLASTY (Right) Patient reports pain as mild and moderate.  Pt feels she is doing much better today and would like to go home.    Slow to progress with PT.  Objective: Vital signs in last 24 hours: Temp:  [97.7 F (36.5 C)-98.3 F (36.8 C)] 98.2 F (36.8 C) (09/20 0529) Pulse Rate:  [67-84] 84 (09/20 0529) Resp:  [16-18] 18 (09/20 0529) BP: (154-178)/(53-59) 178/55 (09/20 0529) SpO2:  [93 %-94 %] 94 % (09/20 0529)  Intake/Output from previous day: 09/19 0701 - 09/20 0700 In: 2121.5 [P.O.:840; I.V.:1281.5] Out: 2350 [Urine:2350] Intake/Output this shift: Total I/O In: 146.7 [P.O.:120; I.V.:26.7] Out: 300 [Urine:300]  Recent Labs    09/12/19 0239 09/13/19 0314  HGB 10.6* 10.1*   Recent Labs    09/12/19 0239 09/13/19 0314  WBC 7.6 8.0  RBC 3.85* 3.69*  HCT 33.7* 32.5*  PLT 155 152   Recent Labs    09/12/19 0239  NA 135  K 4.2  CL 101  CO2 27  BUN 12  CREATININE 0.59  GLUCOSE 196*  CALCIUM 8.8*   No results for input(s): LABPT, INR in the last 72 hours.  Neurovascular intact Sensation intact distally Intact pulses distally Dorsiflexion/Plantar flexion intact Incision: dressing C/D/I   Assessment/Plan: 2 Days Post-Op Procedure(s) (LRB): TOTAL KNEE ARTHROPLASTY (Right) Up with therapy Discharge home with home health  Concerns from daughter that pt will not have enough help at home and may have some early dementia.  Pt communicates to me that her daughter had planned some time off to come help her, her daughter or possibly her brother in [place] would be taking her home from the hospital.  Pt does state she has a husband at home whom she is the primary caregiver for which we were aware of prior to surgery, she also has a 39yo granddaughter with her 2yo daughter living with her at this time.     Anticipated LOS equal to or greater than 2 midnights due to - Age 39 and older with one or more  of the following:  - Obesity  - Expected need for hospital services (PT, OT, Nursing) required for safe  discharge  - Anticipated need for postoperative skilled nursing care or inpatient rehab  - Active co-morbidities: Diabetes OR   - Unanticipated findings during/Post Surgery: None  - Patient is a high risk of re-admission due to: Barriers to post-acute care (logistical, no family support in home) Slow to progress with PT    Chriss Czar 09/13/2019, 10:53 AM

## 2019-09-13 NOTE — Progress Notes (Signed)
09/13/19 1300  PT Visit Information  Last PT Received On 09/13/19 Continued slow progress.  Continue to follow in acute setting. Pt needs SNF vs 24 hour 2 person assist   Assistance Needed +2  History of Present Illness Pt is 68 y.o. female s/p Rt TKA on 09/11/19. She has PMH significant for DM, CPOD, cancer, multiple fractures, and anxety.  Subjective Data  Patient Stated Goal home  Precautions  Precautions Fall  Restrictions  Weight Bearing Restrictions No  RLE Weight Bearing WBAT  Pain Assessment  Pain Assessment Faces  Faces Pain Scale 6  Pain Location Rt knee  Pain Descriptors / Indicators Aching;Burning  Pain Intervention(s) Limited activity within patient's tolerance;Monitored during session;Premedicated before session;Repositioned;Ice applied  Cognition  Arousal/Alertness Awake/alert  Behavior During Therapy WFL for tasks assessed/performed;Anxious  Overall Cognitive Status No family/caregiver present to determine baseline cognitive functioning  Area of Impairment Attention;Following commands;Safety/judgement;Problem solving  Current Attention Level Focused  Following Commands Follows one step commands with increased time;Follows multi-step commands inconsistently  Safety/Judgement Decreased awareness of safety;Decreased awareness of deficits  Problem Solving Slow processing;Decreased initiation;Difficulty sequencing;Requires verbal cues;Requires tactile cues  General Comments Decreased attention and insight into deficits, frequent vc to stay on task. Decreased dual tasking ability.   Bed Mobility  Overal bed mobility Needs Assistance  Bed Mobility Sit to Supine  Sit to supine Min assist  General bed mobility comments assist with LEs, multi-modal cues for sequencing of task, incr time and effort  Transfers  Overall transfer level Needs assistance  Equipment used Rolling walker (2 wheeled)  Transfers Sit to/from Stand  Sit to Stand Mod assist;Max assist;+2  safety/equipment;+2 physical assistance  Stand pivot transfers Mod assist;+2 safety/equipment  General transfer comment multi-modal cues for hand placement, sequencing, assist needed throughout for balance and to maneuver RW for stand pivot  Ambulation/Gait  General Gait Details pivotal steps only d/t fatigue and pain  Balance  Sitting balance-Leahy Scale Fair  Standing balance-Leahy Scale Poor  Total Joint Exercises  Ankle Circles/Pumps AROM;10 reps;Both;Supine  Quad Sets AROM;Right;10 reps;Supine  Heel Slides AAROM;Right;10 reps;Supine  Straight Leg Raises AAROM;Right;10 reps  PT - End of Session  Equipment Utilized During Treatment Gait belt;Right knee immobilizer  Activity Tolerance Patient limited by fatigue  Patient left with call bell/phone within reach;in bed;with bed alarm set  Nurse Communication Mobility status   PT - Assessment/Plan  PT Plan Current plan remains appropriate  PT Visit Diagnosis Unsteadiness on feet (R26.81);Other abnormalities of gait and mobility (R26.89)  PT Frequency (ACUTE ONLY) 7X/week  Follow Up Recommendations Follow surgeon's recommendation for DC plan and follow-up therapies;SNF;Supervision/Assistance - 24 hour  PT equipment None recommended by PT  AM-PAC PT "6 Clicks" Mobility Outcome Measure (Version 2)  Help needed turning from your back to your side while in a flat bed without using bedrails? 3  Help needed moving from lying on your back to sitting on the side of a flat bed without using bedrails? 3  Help needed moving to and from a bed to a chair (including a wheelchair)? 2  Help needed standing up from a chair using your arms (e.g., wheelchair or bedside chair)? 2  Help needed to walk in hospital room? 2  Help needed climbing 3-5 steps with a railing?  1  6 Click Score 13  Consider Recommendation of Discharge To: CIR/SNF/LTACH  PT Goal Progression  Progress towards PT goals Progressing toward goals  Acute Rehab PT Goals  PT Goal  Formulation With patient  Time  For Goal Achievement 09/18/19  Potential to Achieve Goals Good  PT Time Calculation  PT Start Time (ACUTE ONLY) 1248  PT Stop Time (ACUTE ONLY) 1306  PT Time Calculation (min) (ACUTE ONLY) 18 min  PT General Charges  $$ ACUTE PT VISIT 1 Visit  PT Treatments  $Therapeutic Exercise 8-22 mins

## 2019-09-13 NOTE — Progress Notes (Signed)
Physical Therapy Treatment Patient Details Name: [name] MRN: [id number] DOB: 08-Jun-1951 Today's Date: 09/13/2019    History of Present Illness Pt is 68 y.o. female s/p Rt TKA on 09/11/19. She has PMH significant for DM, CPOD, cancer, multiple fractures, and anxety.    PT Comments    Pt progressing slowly. Continues to require +2 assist for transfers, gait. She requires multi-modal repetitious cues for all basic functional mobility and is easily distracted needing frequent redirections to task.  Pt is unsafe to d/c home--if home will need 24hour 2 person assist, would benefit from SNF post acute. Will continue to follow in acute setting  Follow Up Recommendations  Follow surgeon's recommendation for DC plan and follow-up therapies;SNF;Supervision/Assistance - 24 hour     Equipment Recommendations  None recommended by PT    Recommendations for Other Services       Precautions / Restrictions Precautions Precautions: Fall Restrictions Weight Bearing Restrictions: No RLE Weight Bearing: Weight bearing as tolerated    Mobility  Bed Mobility Overal bed mobility: Needs Assistance Bed Mobility: Supine to Sit     Supine to sit: Min assist     General bed mobility comments: Significantly increased time and effort with heavy reliance on bed rails. Pt using L LE to assist R LE off EOB. Assist given to RLE to fully advance to EOB position  Transfers Overall transfer level: Needs assistance Equipment used: Rolling walker (2 wheeled) Transfers: Sit to/from Stand Sit to Stand: Mod assist;+2 physical assistance         General transfer comment: Verbal cues for sequencing. Extra time and effort. Successful on second trial to partially stand. Fully stood on third trial. Assist to power up and steady. Assist to control descent into recliner.  Ambulation/Gait Ambulation/Gait assistance: +2 safety/equipment;Min assist Gait Distance (Feet): 4 Feet Assistive device: Rolling  walker (2 wheeled) Gait Pattern/deviations: Step-to pattern;Decreased stride length;Antalgic;Decreased weight shift to right;Decreased step length - left;Decreased step length - right Gait velocity: decreased   General Gait Details: multi-modal cues for sequence, safety and technique.  pt i seasily distaracted and requires frequent redirection to task   Marine scientist Rankin (Stroke Patients Only)       Balance Overall balance assessment: Needs assistance Sitting-balance support: Feet supported;Single extremity supported;Bilateral upper extremity supported Sitting balance-Leahy Scale: Fair     Standing balance support: Bilateral upper extremity supported;During functional activity Standing balance-Leahy Scale: Poor Standing balance comment: rw and min A to steady                            Cognition Arousal/Alertness: Awake/alert Behavior During Therapy: WFL for tasks assessed/performed;Anxious Overall Cognitive Status: No family/caregiver present to determine baseline cognitive functioning Area of Impairment: Attention;Following commands;Safety/judgement;Problem solving                   Current Attention Level: Focused   Following Commands: Follows one step commands with increased time;Follows multi-step commands inconsistently Safety/Judgement: Decreased awareness of safety;Decreased awareness of deficits   Problem Solving: Slow processing;Decreased initiation;Difficulty sequencing;Requires verbal cues;Requires tactile cues General Comments: Decreased attention and insight into deficits, frequent vc to stay on task. Decreased dual tasking ability.       Exercises      General Comments General comments (skin integrity, edema, etc.):  c/o dizziness, nausea, and feeling faint when rising into seated position.  Vitals assessed: O2 on RA 99, HR 85, BP 148/74      Pertinent Vitals/Pain Pain Assessment:  Faces Faces Pain Scale: Hurts even more Pain Location: Rt knee Pain Descriptors / Indicators: Aching;Burning Pain Intervention(s): Limited activity within patient's tolerance;Monitored during session;Premedicated before session;Repositioned;Ice applied    Home Living Family/patient expects to be discharged to:: Private residence Living Arrangements: Spouse/significant other;Children Available Help at Discharge: Family;Available 24 hours/day Type of Home: House Home Access: Ramped entrance;Stairs to enter Entrance Stairs-Rails: None Home Layout: One level Home Equipment: Walker - 2 wheels;Shower seat;Bedside commode;Cane - single point      Prior Function Level of Independence: Independent with assistive device(s)      Comments: pt used RW or cane when knee pain was severe   PT Goals (current goals can now be found in the care plan section) Acute Rehab PT Goals Patient Stated Goal: home PT Goal Formulation: With patient Time For Goal Achievement: 09/18/19 Potential to Achieve Goals: Good Progress towards PT goals: Not progressing toward goals - comment(nausea, dizziness, cognitive deficits)    Frequency    7X/week      PT Plan Current plan remains appropriate    Co-evaluation PT/OT/SLP Co-Evaluation/Treatment: Yes Reason for Co-Treatment: For patient/therapist safety;To address functional/ADL transfers PT goals addressed during session: Mobility/safety with mobility;Proper use of DME OT goals addressed during session: ADL's and self-care      AM-PAC PT "6 Clicks" Mobility   Outcome Measure  Help needed turning from your back to your side while in a flat bed without using bedrails?: A Little Help needed moving from lying on your back to sitting on the side of a flat bed without using bedrails?: A Little Help needed moving to and from a bed to a chair (including a wheelchair)?: A Lot Help needed standing up from a chair using your arms (e.g., wheelchair or bedside  chair)?: A Lot Help needed to walk in hospital room?: A Lot Help needed climbing 3-5 steps with a railing? : Total 6 Click Score: 13    End of Session Equipment Utilized During Treatment: Gait belt Activity Tolerance: Patient limited by fatigue;Treatment limited secondary to medical complications (Comment)(nausea and dizziness ) Patient left: in chair;with call bell/phone within reach;with chair alarm set Nurse Communication: Mobility status PT Visit Diagnosis: Unsteadiness on feet (R26.81);Other abnormalities of gait and mobility (R26.89)     Time: [id number] PT Time Calculation (min) (ACUTE ONLY): 27 min  Charges:  $Therapeutic Activity: 8-22 mins                     Kenyon Ana, PT  Pager: [phone] Acute Rehab Dept [facility]): [id number]   09/13/2019    [facility] 09/13/2019, 1:25 PM

## 2019-09-13 NOTE — Evaluation (Signed)
Occupational Therapy Evaluation Patient Details Name: [name] MRN: [id number] DOB: [birth year] Today's Date: 09/13/2019    History of Present Illness Pt is 68 y.o. female s/p Rt TKA on 09/11/19. She has PMH significant for DM, CPOD, cancer, multiple fractures, and anxety.   Clinical Impression   Pt admitted with the above diagnoses and presents with below problem list. Pt will benefit from continued acute OT to address the below listed deficits and maximize independence with basic ADLs prior to d/c to venue below. PTA pt was mod I with ADLs. Pt limited by dizziness, nausea and pain with transfers. Able to walk only 5' this session. Pt is currently needing +2 mod A with LB ADLs and pivotal transfers to [facility], min +2 physical A to walk a limited distance.  Discussed ST SNF with pt as she does not seem to have 24/7 assistance available and is currently needing +2 assist. Pt reluctant to consider SNF as she reports she had a bad experience the last time she went to a SNF. Encouraged pt to consider and discuss with family. Pt does not appear ready for d/c home today from OT standpoint.      Follow Up Recommendations  Follow surgeon's recommendation for DC plan and follow-up therapies;SNF    Equipment Recommendations  Other (comment)(defer to next venue)    Recommendations for Other Services       Precautions / Restrictions Precautions Precautions: Fall Restrictions Weight Bearing Restrictions: No RLE Weight Bearing: Weight bearing as tolerated      Mobility Bed Mobility Overal bed mobility: Needs Assistance Bed Mobility: Supine to Sit     Supine to sit: Min assist     General bed mobility comments: Significantly increased time and effort with heavy reliance on bed rails. Pt using L LE to assist R LE off EOB. Assist given to RLE to fully advance to EOB position  Transfers Overall transfer level: Needs assistance Equipment used: Rolling walker (2 wheeled) Transfers: Sit  to/from Stand Sit to Stand: Mod assist;+2 physical assistance         General transfer comment: Verbal cues for sequencing. Extra time and effort. Successful on second trial to partially stand. Fully stood on third trial. Assist to power up and steady. Assist to control descent into recliner.    Balance Overall balance assessment: Needs assistance Sitting-balance support: Feet supported;Single extremity supported;Bilateral upper extremity supported Sitting balance-Leahy Scale: Fair     Standing balance support: Bilateral upper extremity supported;During functional activity Standing balance-Leahy Scale: Poor Standing balance comment: rw and min A to steady                           ADL either performed or assessed with clinical judgement   ADL Overall ADL's : Needs assistance/impaired Eating/Feeding: Set up;Sitting   Grooming: Set up;Sitting   Upper Body Bathing: Set up;Sitting   Lower Body Bathing: Moderate assistance;+2 for physical assistance;Sit to/from stand;Cueing for sequencing;Cueing for compensatory techniques   Upper Body Dressing : Set up;Sitting   Lower Body Dressing: Moderate assistance;+2 for physical assistance;Sit to/from stand;Cueing for compensatory techniques;Cueing for sequencing   Toilet Transfer: Moderate assistance;+2 for physical assistance;Stand-pivot;RW;BSC;Cueing for safety;Cueing for sequencing   Toileting- Clothing Manipulation and Hygiene: Moderate assistance;+2 for physical assistance;Sit to/from stand;Cueing for safety;Cueing for sequencing;Cueing for compensatory techniques   Tub/ Shower Transfer: Moderate assistance;+2 for physical assistance;Cueing for sequencing;Cueing for safety;Stand-pivot;3 in 1;Rolling walker   Functional mobility during ADLs: Minimal assistance;+2 for physical  assistance;Rolling walker(walked about 5' in the room ) General ADL Comments: Pt completed bed mobility, sat EOB and walked about 5". Nausea,  dizziness, and pain limiting factors.      Vision         Perception     Praxis      Pertinent Vitals/Pain Pain Assessment: Faces Faces Pain Scale: Hurts even more Pain Location: Rt knee Pain Descriptors / Indicators: Aching Pain Intervention(s): Limited activity within patient's tolerance;Monitored during session;Repositioned     Hand Dominance     Extremity/Trunk Assessment Upper Extremity Assessment Upper Extremity Assessment: Generalized weakness   Lower Extremity Assessment Lower Extremity Assessment: Defer to PT evaluation       Communication Communication Communication: No difficulties   Cognition Arousal/Alertness: Awake/alert Behavior During Therapy: WFL for tasks assessed/performed;Anxious Overall Cognitive Status: No family/caregiver present to determine baseline cognitive functioning                                 General Comments: Decreased attention and insight into deficits, frequent vc to stay on task. Decreased dual tasking ability.    General Comments  Pt c/o dizziness, nausea, and feeling faint when rising into seated position. Vitals assessed: O2 on RA 99, HR 85, BP 148/74    Exercises     Shoulder Instructions      Home Living Family/patient expects to be discharged to:: Private residence Living Arrangements: Spouse/significant other;Children Available Help at Discharge: Family;Available 24 hours/day Type of Home: House Home Access: Ramped entrance;Stairs to enter Entrance Stairs-Number of Steps: 2 steps at teh side with a post/bar in the brick wall, no railings; ramp at front door that she gets pushed up with w/c Entrance Stairs-Rails: None Home Layout: One level         Bathroom Toilet: Standard Bathroom Accessibility: Yes   Home Equipment: Walker - 2 wheels;Shower seat;Bedside commode;Cane - single point          Prior Functioning/Environment Level of Independence: Independent with assistive device(s)         Comments: pt used RW or cane when knee pain was severe        OT Problem List: Decreased strength;Decreased activity tolerance;Impaired balance (sitting and/or standing);Decreased cognition;Decreased safety awareness;Decreased knowledge of use of DME or AE;Decreased knowledge of precautions;Pain      OT Treatment/Interventions: Self-care/ADL training;DME and/or AE instruction;Therapeutic exercise;Therapeutic activities;Patient/family education;Balance training    OT Goals(Current goals can be found in the care plan section) Acute Rehab OT Goals Patient Stated Goal: home OT Goal Formulation: With patient Time For Goal Achievement: 09/27/19 Potential to Achieve Goals: Good ADL Goals Pt Will Perform Lower Body Bathing: with min guard assist;sit to/from stand Pt Will Perform Lower Body Dressing: with min guard assist;sit to/from stand Pt Will Transfer to Toilet: with min assist;ambulating Pt Will Perform Toileting - Clothing Manipulation and hygiene: with min assist;sit to/from stand Pt Will Perform Tub/Shower Transfer: with min assist;ambulating;3 in 1;rolling walker Additional ADL Goal #1: Pt will complete bed mobility at supervision level to prepare for OOB ADLs.  OT Frequency: Min 2X/week   Barriers to D/C: Decreased caregiver support  pt lives with spouse who ambulates with walker, daughter works full-time; does not appeared to have 24/7 assistance available at home       Co-evaluation PT/OT/SLP Co-Evaluation/Treatment: Yes Reason for Co-Treatment: For Doctor, hospital;To address functional/ADL transfers   OT goals addressed during session: ADL's and self-care  AM-PAC OT "6 Clicks" Daily Activity     Outcome Measure Help from another person eating meals?: None Help from another person taking care of personal grooming?: A Little Help from another person toileting, which includes using toliet, bedpan, or urinal?: A Lot Help from another person bathing (including  washing, rinsing, drying)?: A Lot Help from another person to put on and taking off regular upper body clothing?: A Little Help from another person to put on and taking off regular lower body clothing?: A Lot 6 Click Score: 16   End of Session Equipment Utilized During Treatment: Rolling walker;Gait belt;Right knee immobilizer Nurse Communication: Mobility status;Other (comment)(dizziness and nausea, not ready for d/c from OT/PT standpoin)  Activity Tolerance: Patient limited by fatigue;Other (comment)(dizzy and nausea) Patient left: in chair;with call bell/phone within reach;with chair alarm set  OT Visit Diagnosis: Unsteadiness on feet (R26.81);Muscle weakness (generalized) (M62.81);Pain;Other symptoms and signs involving cognitive function Pain - Right/Left: Right Pain - part of body: Knee                Time: [id number] OT Time Calculation (min): 26 min Charges:  OT General Charges $OT Visit: 1 Visit OT Evaluation $OT Eval Low Complexity: [place], OT Acute Rehabilitation Services Pager: [phone] Office: [phone]   Ajla, Hodgen 09/13/2019, 11:45 AM

## 2019-09-13 NOTE — Discharge Summary (Addendum)
PATIENT ID: [name]        MRN:  [id number]          DOB/AGE: May 21, 1951 / 68 y.o.    DISCHARGE SUMMARY  ADMISSION DATE:    09/11/2019 DISCHARGE DATE:   09/15/19  ADMISSION DIAGNOSIS: OA RIGHT KNEE    DISCHARGE DIAGNOSIS:  OA RIGHT KNEE    ADDITIONAL DIAGNOSIS: Active Problems:   Primary localized osteoarthritis of right knee  Past Medical History:  Diagnosis Date  . Anemia    as a teenager  . Anxiety   . Arthritis    knees, rt hand,hips  . Cancer ([place])    vaginal,uterine,ovariam  . COPD (chronic obstructive pulmonary disease) ([place])   . Depression   . Diabetes mellitus without complication ([place])   . Hypothyroidism   . Neuromuscular disorder ([place])    hands and feet    PROCEDURE: Procedure(s): TOTAL KNEE ARTHROPLASTY Right on 09/11/2019  CONSULTS: PT/OT    HISTORY:  See H&P in chart  HOSPITAL COURSE:  [name] is a 68 y.o. admitted on 09/11/2019 and found to have a diagnosis of OA RIGHT KNEE.  After appropriate laboratory studies were obtained  they were taken to the operating room on 09/11/2019 and underwent  Procedure(s): TOTAL KNEE ARTHROPLASTY  Right.   They were given perioperative antibiotics:  Anti-infectives (From admission, onward)   Start     Dose/Rate Route Frequency Ordered Stop   09/11/19 2200  acyclovir (ZOVIRAX) tablet 400 mg     400 mg Oral Every 12 hours 09/11/19 1458     09/11/19 2000  vancomycin (VANCOCIN) IVPB 1000 mg/200 mL premix     1,000 mg 200 mL/hr over 60 Minutes Intravenous Every 12 hours 09/11/19 1409 09/11/19 2254   09/11/19 0815  vancomycin (VANCOCIN) powder  Status:  Discontinued       As needed 09/11/19 0815 09/11/19 1019   09/11/19 0749  vancomycin (VANCOCIN) 1-5 GM/200ML-% IVPB    Note to Pharmacy: Mardelle Matte   : cabinet override      09/11/19 0749 09/11/19 1959   09/11/19 0600  ceFAZolin (ANCEF) IVPB 2g/100 mL premix     2 g 200 mL/hr over 30 Minutes Intravenous On call to O.R. 09/11/19 [id number] 09/11/19  0744    .  Tolerated the procedure well.  Placed with a foley intraoperatively.      POD #1, allowed out of bed to a chair.  PT for ambulation and exercise program.  Foley D/C'd in morning.  IV saline locked.  O2 discontionued.  POD #2, continued PT and ambulation.  Making slow progress with PT concerns for d/c. SW consulted for concerns post op and home support or lack there of.  POD#3, continued slow to progress with PT recommending SNF, awaiting insurance approval  The remainder of the hospital course was dedicated to ambulation and strengthening.  The patient was discharged on 4 days post op in  Stable condition.  Blood products given:none  DIAGNOSTIC STUDIES: Recent vital signs:  Patient Vitals for the past 24 hrs:  BP Temp Temp src Pulse Resp SpO2  09/15/19 0605 (!) 135/58 98.1 F (36.7 C) Oral 70 16 90 %  09/14/19 2144 123/85 98.3 F (36.8 C) Oral 74 16 95 %  09/14/19 2049 - - - 72 16 -  09/14/19 1307 (!) 145/59 98.4 F (36.9 C) Oral 75 16 95 %       Recent laboratory studies: Recent Labs    09/12/19 0239 09/13/19 0314  09/14/19 0254  WBC 7.6 8.0 7.7  HGB 10.6* 10.1* 10.1*  HCT 33.7* 32.5* 32.3*  PLT 155 152 155   Recent Labs    09/12/19 0239  NA 135  K 4.2  CL 101  CO2 27  BUN 12  CREATININE 0.59  GLUCOSE 196*  CALCIUM 8.8*   Lab Results  Component Value Date   INR 1.1 09/03/2019     Recent Radiographic Studies :  No results found.  DISCHARGE INSTRUCTIONS:   DISCHARGE MEDICATIONS:   Allergies as of 09/15/2019      Reactions   Ambien [zolpidem Tartrate] Other (See Comments)   Hallucinations/nightmares (tried to jump out of a window)   Coconut Flavor Swelling   Mouth swells   Codeine Nausea And Vomiting   Onion Nausea Only, Other (See Comments)   Raw onions   Other    Ground Beef (patient avoid due to side effect of diarrhea)   Sulfa Antibiotics Rash, Other (See Comments)   Including topical sulfa  (mouth dries/peels/skin blisters)       Medication List    STOP taking these medications   ibuprofen 200 MG tablet Commonly known as: ADVIL     TAKE these medications   acetaminophen 650 MG CR tablet Commonly known as: TYLENOL Take 650 mg by mouth every 8 (eight) hours as needed for pain.   acyclovir 400 MG tablet Commonly known as: ZOVIRAX Take 400 mg by mouth every 12 (twelve) hours. Breakfast & bedtime   aspirin EC 81 MG tablet Take 1 tablet (81 mg total) by mouth 2 (two) times daily. TO PREVENT BLOOD CLOTS   aspirin 81 MG chewable tablet Chew 1 tablet (81 mg total) by mouth 2 (two) times daily.   buPROPion 300 MG 24 hr tablet Commonly known as: WELLBUTRIN XL Take 300 mg by mouth at bedtime.   docusate sodium 100 MG capsule Commonly known as: Colace Take 1 capsule (100 mg total) by mouth daily as needed.   escitalopram 5 MG tablet Commonly known as: LEXAPRO Take 5 mg by mouth daily with breakfast.   fluticasone 50 MCG/ACT nasal spray Commonly known as: FLONASE Place 1-2 sprays into both nostrils daily as needed for allergies or rhinitis.   Lantus SoloStar 100 UNIT/ML Solostar Pen Generic drug: Insulin Glargine Inject 40-50 Units into the skin See admin instructions. Inject 50 units subcutaneously with breakfast & 40 units subcutaneously after supper   levothyroxine 112 MCG tablet Commonly known as: SYNTHROID Take 112 mcg by mouth daily before breakfast. On empty stomach   omeprazole 20 MG capsule Commonly known as: PRILOSEC Take 20 mg by mouth every morning.   ondansetron 4 MG tablet Commonly known as: Zofran Take 1 tablet (4 mg total) by mouth every 8 (eight) hours as needed for nausea or vomiting.   oxyCODONE 5 MG immediate release tablet Commonly known as: Oxy IR/ROXICODONE Take one tab po q4-6hrs prn pain, may need 1-2 first couple weeks   oxyCODONE 5 MG immediate release tablet Commonly known as: Oxy IR/ROXICODONE Take 1-2 tablets (5-10 mg total) by mouth every 4 (four) hours as needed  for moderate pain (pain score 4-6).   rosuvastatin 10 MG tablet Commonly known as: CRESTOR Take 10 mg by mouth at bedtime.            Durable Medical Equipment  (From admission, onward)         Start     Ordered   09/11/19 1459  DME Walker rolling  Once  Question:  Patient needs a walker to treat with the following condition  Answer:  Primary localized osteoarthritis of right knee   09/11/19 1458          FOLLOW UP VISIT:   Follow-up Information    Earlie Server, MD. Schedule an appointment as soon as possible for a visit in 2 weeks.   Specialty: Orthopedic Surgery Why: or as previously scheduled Contact information: [place] [address] [phone]        [facility].   Specialty: Home Health Services Why: agency will provide home health physical therapy. agency will call you to schedule first visit. Contact information: [address] [place] [address] [phone]           DISPOSITION:   SNF  CONDITION:  Stable   Chriss Czar, PA-C  09/15/2019 9:13 AM

## 2019-09-14 ENCOUNTER — Encounter (HOSPITAL_COMMUNITY): Payer: Self-pay | Admitting: Orthopedic Surgery

## 2019-09-14 LAB — GLUCOSE, CAPILLARY
Glucose-Capillary: 174 mg/dL — ABNORMAL HIGH (ref 70–99)
Glucose-Capillary: 130 mg/dL — ABNORMAL HIGH (ref 70–99)
Glucose-Capillary: 184 mg/dL — ABNORMAL HIGH (ref 70–99)
Glucose-Capillary: 181 mg/dL — ABNORMAL HIGH (ref 70–99)
Glucose-Capillary: 154 mg/dL — ABNORMAL HIGH (ref 70–99)

## 2019-09-14 LAB — CBC
HCT: 32.3 % — ABNORMAL LOW (ref 36.0–46.0)
Hemoglobin: 10.1 g/dL — ABNORMAL LOW (ref 12.0–15.0)
MCH: 27.6 pg (ref 26.0–34.0)
MCHC: 31.3 g/dL (ref 30.0–36.0)
MCV: 88.3 fL (ref 80.0–100.0)
Platelets: 155 10*3/uL (ref 150–400)
RBC: 3.66 MIL/uL — ABNORMAL LOW (ref 3.87–5.11)
RDW: 13.7 % (ref 11.5–15.5)
WBC: 7.7 10*3/uL (ref 4.0–10.5)
nRBC: 0 % (ref 0.0–0.2)

## 2019-09-14 MED ORDER — ASPIRIN 81 MG PO CHEW: 81.0000 mg | CHEWABLE_TABLET | Freq: Two times a day (BID) | ORAL | 0 refills | Status: DC

## 2019-09-14 MED ORDER — OXYCODONE HCL 5 MG PO TABS: 5.0000 mg | ORAL_TABLET | ORAL | 0 refills | Status: DC | PRN

## 2019-09-14 NOTE — Progress Notes (Signed)
Subjective: 3 Days Post-Op Procedure(s) (LRB): TOTAL KNEE ARTHROPLASTY (Right) Patient reports pain as mild and moderate.  Objective: Vital signs in last 24 hours: Temp:  [98.6 F (37 C)-99.8 F (37.7 C)] 98.6 F (37 C) (09/21 0519) Pulse Rate:  [72-75] 72 (09/21 0519) Resp:  [16-18] 16 (09/21 0519) BP: (141-165)/(47-72) 165/61 (09/21 0519) SpO2:  [97 %-99 %] 98 % (09/21 0519)  Intake/Output from previous day: 09/20 0701 - 09/21 0700 In: 946.7 [P.O.:920; I.V.:26.7] Out: 2300 [Urine:2300] Intake/Output this shift: No intake/output data recorded.  Recent Labs    09/12/19 0239 09/13/19 0314 09/14/19 0254  HGB 10.6* 10.1* 10.1*   Recent Labs    09/13/19 0314 09/14/19 0254  WBC 8.0 7.7  RBC 3.69* 3.66*  HCT 32.5* 32.3*  PLT 152 155   Recent Labs    09/12/19 0239  NA 135  K 4.2  CL 101  CO2 27  BUN 12  CREATININE 0.59  GLUCOSE 196*  CALCIUM 8.8*   No results for input(s): LABPT, INR in the last 72 hours.  Neurovascular intact Sensation intact distally Intact pulses distally Dorsiflexion/Plantar flexion intact Incision: dressing C/D/I   Assessment/Plan: 3 Days Post-Op Procedure(s) (LRB): TOTAL KNEE ARTHROPLASTY (Right) Up with therapy   Pt still desirous to go home.  Slow progression with PT to the point recommend 2 person assist at home or SNF.  SW has not been able to speak with pts daughter yet but sounds like there will not be enough help available at home from my understanding and should likely push forward with SNF.  Rx have been printed once placement obtained pt may d/c from ortho standpoint.     Anticipated LOS equal to or greater than 2 midnights due to - Age 70 and older with one or more of the following:  - Obesity  - Expected need for hospital services (PT, OT, Nursing) required for safe  discharge  - Anticipated need for postoperative skilled nursing care or inpatient rehab  - Active co-morbidities: Diabetes OR   - Unanticipated  findings during/Post Surgery: Slow post-op progression: GI, pain control, mobility  - Patient is a high risk of re-admission due to: Barriers to post-acute care (logistical, no family support in home)    Chriss Czar 09/14/2019, 9:43 AM

## 2019-09-14 NOTE — Care Management Important Message (Signed)
Important Message  Patient Details IM Letter given to Velva Harman RN to present to the Patient Name: [name] MRN: [id number] Date of Birth: [birth year]   Medicare Important Message Given:  Yes     Kerin Salen 09/14/2019, 12:11 PM

## 2019-09-14 NOTE — Plan of Care (Signed)

## 2019-09-14 NOTE — Progress Notes (Signed)
Physical Therapy Treatment Patient Details Name: [name] MRN: [id number] DOB: [birth year] Today's Date: 09/14/2019    History of Present Illness Pt is 68 y.o. female s/p Rt TKA on 09/11/19. She has PMH significant for DM, CPOD, cancer, multiple fractures, and anxety.    PT Comments    POD # 3 pm session Assisted with amb a second time.  General Gait Details: decreased amb distance due to increased c/o pain and fatigue.  50% VC's on safety with turns and backward gait completion prior to sit.  Unsteady. Assisted back to bed then performed TKR TE's to tolerance.  Limited by pain and ROM limited 10 - 40 degrees.    Follow Up Recommendations        Equipment Recommendations       Recommendations for Other Services       Precautions / Restrictions Precautions Precautions: Fall Restrictions Weight Bearing Restrictions: No RLE Weight Bearing: Weight bearing as tolerated    Mobility  Bed Mobility Overal bed mobility: Needs Assistance Bed Mobility: Sit to Supine     Supine to sit: Min assist Sit to supine: Min assist;Mod assist   General bed mobility comments: assisted back to bed  Transfers Overall transfer level: Needs assistance Equipment used: Rolling walker (2 wheeled) Transfers: Sit to/from Stand Sit to Stand: Mod assist;Max assist Stand pivot transfers: Max assist       General transfer comment: 50% VC's on proper hand placement and safety with turns. [name].  Uncontrolled decend onto bed.  Ambulation/Gait Ambulation/Gait assistance: Min assist;Mod assist Gait Distance (Feet): 8 Feet Assistive device: Rolling walker (2 wheeled) Gait Pattern/deviations: Step-to pattern;Decreased stride length;Antalgic;Decreased weight shift to right;Decreased step length - left;Decreased step length - right Gait velocity: decreased   General Gait Details: decreased amb distance due to increased c/o pain and fatigue.  50% VC's on safety with turns and backward gait  completion prior to sit.  Unsteady.   Stairs             Wheelchair Mobility    Modified Rankin (Stroke Patients Only)       Balance                                            Cognition Arousal/Alertness: Awake/alert                                     General Comments: distracted, emotional, trying to call [name] to pay a late phone bill.  Difficulty focusing, processing multi steps.      Exercises   Total Knee Replacement TE's 10 reps B LE ankle pumps 10 reps towel squeezes 10 reps knee presses 10 reps heel slides  10 reps SAQ's 10 reps SLR's 10 reps ABD Followed by [facility]    General Comments        Pertinent Vitals/Pain Pain Assessment: Faces Faces Pain Scale: Hurts little more Pain Location: Rt knee Pain Descriptors / Indicators: Aching;Burning;Grimacing;Operative site guarding Pain Intervention(s): Monitored during session;Premedicated before session;Repositioned;Ice applied    Home Living                      Prior Function            PT Goals (current goals can now be found in the  care plan section) Acute Rehab PT Goals Patient Stated Goal: home Progress towards PT goals: Progressing toward goals    Frequency           PT Plan      Co-evaluation              AM-PAC PT "6 Clicks" Mobility   Outcome Measure                   End of Session Equipment Utilized During Treatment: Gait belt Activity Tolerance: Patient limited by fatigue;Other (comment)(impaired focus) Patient left: in bed;with bed alarm set Nurse Communication: Mobility status       Time: 1355-1420 PT Time Calculation (min) (ACUTE ONLY): 25 min  Charges:  $Gait Training: 8-22 mins $Therapeutic Exercise: 8-22 mins                      [name]  PTA Acute  Rehabilitation Services Pager      [phone] Office      [phone]

## 2019-09-14 NOTE — TOC Progression Note (Signed)
Transition of Care [facility]) - Progression Note    Patient Details  Name: AFRAH RYKER MRN: [id number] Date of Birth: 24-Aug-1951  [facility]) CM/SW Contact  Leeroy Cha, RN Phone Number: 09/14/2019, 3:40 PM  Clinical Narrative:    Patient has accepted [facility] for snf.  Note sent to md of need for rapid covid test. Awaiting insurance auth and bed availability.   Expected Discharge Plan: Skilled Nursing Facility Barriers to Discharge: Insurance Authorization  Expected Discharge Plan and Services Expected Discharge Plan: [place]   Discharge Planning Services: CM Consult Post Acute Care Choice: [place] arrangements for the past 2 months: Single Family Home Expected Discharge Date: 09/14/19               DME Arranged: N/A DME Agency: NA       HH Arranged: PT HH [facility] [employer] Date [place]: 09/12/19 Time [place]: 1233 Representative spoke with at [place]: pre arranged in MD office   Social Determinants of Health ([place]) Interventions    Readmission Risk Interventions No flowsheet data found.

## 2019-09-14 NOTE — TOC Progression Note (Signed)
Transition of Care [facility]) - Progression Note    Patient Details  Name: [name] MRN: [id number] Date of Birth: Dec 25, 1950  [facility]) CM/SW Contact  Leeroy Cha, RN Phone Number: 09/14/2019, 11:41 AM  Clinical Narrative:    [facility]/will have bed in the am/need updated covid will let md  Know.   Expected Discharge Plan: [place] Barriers to Discharge: Continued Medical Work up  Expected Discharge Plan and Services Expected Discharge Plan: [place]   Discharge Planning Services: CM Consult Post Acute Care Choice: [place] arrangements for the past 2 months: Single Family Home Expected Discharge Date: 09/14/19               DME Arranged: N/A DME Agency: NA       HH Arranged: PT HH [facility] [employer] Date [place]: 09/12/19 Time [place]: 1233 Representative spoke with at [place]: pre arranged in MD office   Social Determinants of Health ([place]) Interventions    Readmission Risk Interventions No flowsheet data found.

## 2019-09-14 NOTE — NC FL2 (Signed)
[place] LEVEL OF CARE SCREENING TOOL     IDENTIFICATION  Patient Name: [name] Birthdate: Oct 25, 1951 Sex: female Admission Date (Current Location): 09/11/2019  [facility] and [place] Number:  Herbalist and Address:         Provider Number: [id number]  Attending Physician Name and Address:  Earlie Server, MD  Relative Name and Phone Number:       Current Level of Care: Hospital Recommended Level of Care: [place] Prior Approval Number:    Date Approved/Denied: 06/05/18 PASRR Number: [id number]  Discharge Plan: SNF    Current Diagnoses: Patient Active Problem List   Diagnosis Date Noted  . Primary localized osteoarthritis of right knee 09/11/2019    Orientation RESPIRATION BLADDER Height & Weight     Self, Time, Situation, Place  Normal Continent Weight: 96.6 kg Height:  5\' 7"  (170.2 cm)  BEHAVIORAL SYMPTOMS/MOOD NEUROLOGICAL BOWEL NUTRITION STATUS      Continent Diet(regular)  AMBULATORY STATUS COMMUNICATION OF NEEDS Skin   Extensive Assist Verbally Normal                       Personal Care Assistance Level of Assistance  Feeding, Bathing, Dressing Bathing Assistance: Limited assistance Feeding assistance: Limited assistance Dressing Assistance: Limited assistance     Functional Limitations Info  Sight Sight Info: Adequate        SPECIAL CARE FACTORS FREQUENCY  PT (By licensed PT)     PT Frequency: 5xweekly              Contractures Contractures Info: Not present    Additional Factors Info  Code Status Code Status Info: full             Current Medications (09/14/2019):  This is the current hospital active medication list Current Facility-Administered Medications  Medication Dose Route Frequency Provider Last Rate Last Dose  . 0.9 %  sodium chloride infusion   Intravenous Continuous Chadwell, Joshua, PA-C   Stopped at 09/13/19 0720  . acetaminophen (TYLENOL) tablet 325-650  mg  325-650 mg Oral Q6H PRN Chriss Czar, PA-C   650 mg at 09/13/19 1134  . acyclovir (ZOVIRAX) tablet 400 mg  400 mg Oral Q12H Chadwell, Joshua, PA-C   400 mg at 09/14/19 0758  . aspirin chewable tablet 81 mg  81 mg Oral BID Chadwell, Joshua, PA-C   81 mg at 09/14/19 0759  . bisacodyl (DULCOLAX) suppository 10 mg  10 mg Rectal Daily PRN Chadwell, Joshua, PA-C      . buPROPion (WELLBUTRIN XL) 24 hr tablet 300 mg  300 mg Oral QHS Chadwell, Joshua, PA-C   300 mg at 09/13/19 2202  . diphenhydrAMINE (BENADRYL) 12.5 MG/5ML elixir 12.5-25 mg  12.5-25 mg Oral Q4H PRN Chadwell, Joshua, PA-C   25 mg at 09/12/19 0132  . docusate sodium (COLACE) capsule 100 mg  100 mg Oral BID Chadwell, Joshua, PA-C   100 mg at 09/14/19 0759  . escitalopram (LEXAPRO) tablet 5 mg  5 mg Oral Q breakfast Chadwell, Joshua, PA-C   5 mg at 09/14/19 [id number]  . fluticasone (FLONASE) 50 MCG/ACT nasal spray 1-2 spray  1-2 spray Each Nare Daily PRN Chadwell, Joshua, PA-C      . HYDROmorphone (DILAUDID) injection 0.5 mg  0.5 mg Intravenous Q2H PRN Chadwell, Joshua, PA-C   0.5 mg at 09/12/19 1038  . insulin aspart (novoLOG) injection 0-15 Units  0-15 Units Subcutaneous TID WC Chadwell, Vonna Kotyk, PA-C   2  Units at 09/14/19 0803  . insulin aspart (novoLOG) injection 0-5 Units  0-5 Units Subcutaneous QHS Chadwell, Joshua, PA-C   2 Units at 09/11/19 2238  . insulin aspart (novoLOG) injection 4 Units  4 Units Subcutaneous TID WC Chadwell, Joshua, PA-C   4 Units at 09/14/19 0804  . lactated ringers infusion   Intravenous Continuous Roberts Gaudy, MD   Stopped at 09/11/19 1509  . levothyroxine (SYNTHROID) tablet 112 mcg  112 mcg Oral Q0600 Chriss Czar, PA-C   112 mcg at 09/14/19 [id number]  . menthol-cetylpyridinium (CEPACOL) lozenge 3 mg  1 lozenge Oral PRN Chadwell, Joshua, PA-C       Or  . phenol (CHLORASEPTIC) mouth spray 1 spray  1 spray Mouth/Throat PRN Chadwell, Joshua, PA-C      . metoCLOPramide (REGLAN) tablet 5-10 mg  5-10 mg Oral Q8H  PRN Chadwell, Joshua, PA-C       Or  . metoCLOPramide (REGLAN) injection 5-10 mg  5-10 mg Intravenous Q8H PRN Chadwell, Joshua, PA-C   10 mg at 09/13/19 1134  . ondansetron (ZOFRAN) tablet 4 mg  4 mg Oral Q6H PRN Chadwell, Joshua, PA-C       Or  . ondansetron (ZOFRAN) injection 4 mg  4 mg Intravenous Q6H PRN Chadwell, Joshua, PA-C   4 mg at 09/13/19 0741  . oxyCODONE (Oxy IR/ROXICODONE) immediate release tablet 5-10 mg  5-10 mg Oral Q4H PRN Chadwell, Joshua, PA-C   10 mg at 09/14/19 0804  . pantoprazole (PROTONIX) EC tablet 40 mg  40 mg Oral Daily Chadwell, Joshua, PA-C   40 mg at 09/14/19 0800  . polyethylene glycol (MIRALAX / GLYCOLAX) packet 17 g  17 g Oral Daily PRN Chadwell, Joshua, PA-C      . rosuvastatin (CRESTOR) tablet 10 mg  10 mg Oral QHS Chadwell, Joshua, PA-C   10 mg at 09/13/19 2203  . sodium phosphate (FLEET) 7-19 GM/118ML enema 1 enema  1 enema Rectal Once PRN Chriss Czar, PA-C         Discharge Medications: Please see discharge summary for a list of discharge medications.  Relevant Imaging Results:  Relevant Lab Results:   Additional Information [id number]  Leeroy Cha, RN

## 2019-09-14 NOTE — TOC Initial Note (Signed)
Transition of Care [facility]) - Initial/Assessment Note    Patient Details  Name: [name] MRN: [id number] Date of Birth: 1951/12/07  [facility]) CM/SW Contact:    Leeroy Cha, RN Phone Number: 09/14/2019, 10:53 AM  Clinical Narrative:                 spoke with patient is agreeable to going to snf.  States that she was in [employer] before. Fl2 faxed to area snf's  Expected Discharge Plan: [place] Barriers to Discharge: Continued Medical Work up   Patient Goals and CMS Choice Patient states their goals for this hospitalization and ongoing recovery are:: to go home CMS Medicare.gov Compare Post Acute Care list provided to:: Patient Choice offered to / list presented to : Patient  Expected Discharge Plan and Services Expected Discharge Plan: [place]   Discharge Planning Services: CM Consult Post Acute Care Choice: [place] arrangements for the past 2 months: Single Family Home Expected Discharge Date: 09/14/19               DME Arranged: N/A DME Agency: NA       HH Arranged: PT HH Agency: [facility] ([facility] [employer]) Date [place]: 09/12/19 Time [place]: 29 Representative spoke with at [place]: pre arranged in MD office  Prior Living Arrangements/Services Living arrangements for the past 2 months: [place] with:: Spouse Patient language and need for interpreter reviewed:: Yes Do you feel safe going back to the place where you live?: Yes      Need for Family Participation in Patient Care: Yes (Comment) Care giver support system in place?: Yes (comment)   Criminal Activity/Legal Involvement Pertinent to Current Situation/Hospitalization: No - Comment as needed  Activities of Daily Living Home Assistive Devices/Equipment: Dentures (specify type), CBG Meter, Eyeglasses ADL Screening (condition at time of admission) Patient's cognitive  ability adequate to safely complete daily activities?: Yes Is the patient deaf or have difficulty hearing?: No Does the patient have difficulty seeing, even when wearing glasses/contacts?: No Does the patient have difficulty concentrating, remembering, or making decisions?: No Patient able to express need for assistance with ADLs?: Yes Does the patient have difficulty dressing or bathing?: No Independently performs ADLs?: Yes (appropriate for developmental age) Does the patient have difficulty walking or climbing stairs?: Yes Weakness of Legs: Both Weakness of Arms/Hands: Right  Permission Sought/Granted                  Emotional Assessment Appearance:: Appears stated age Attitude/Demeanor/Rapport: Engaged Affect (typically observed): Accepting Orientation: : Oriented to  Time, Oriented to Situation, Oriented to Place, Oriented to Self   Psych Involvement: No (comment)  Admission diagnosis:  OA RIGHT KNEE Patient Active Problem List   Diagnosis Date Noted  . Primary localized osteoarthritis of right knee 09/11/2019   PCP:  Manon Hilding, MD Pharmacy:   [place], [place] [id number] [place] [id number] Phone: [phone] Fax: [phone]     Social Determinants of Health (SDOH) Interventions    Readmission Risk Interventions No flowsheet data found.

## 2019-09-14 NOTE — Progress Notes (Signed)
Physical Therapy Treatment Patient Details Name: [name] MRN: [id number] DOB: 04/12/1951 Today's Date: 09/14/2019    History of Present Illness Pt is 68 y.o. female s/p Rt TKA on 09/11/19. She has PMH significant for DM, CPOD, cancer, multiple fractures, and anxety.    PT Comments    POD # 3 am session Assisted OOB to amb to bathroom required increased time.  Very unsteady gait with tendency yo push walker too far front.  Unsteady with turns and backward steps to toilet.  HIGH FALL RISK.  Pt also has difficulty self rising present with posterior LOB.  Pt required assist with peri care as she was unable to self perform and achieve a safe standing posture.  Assisted with amb back to recliner.   Pt will need ST Rehab at SNF prior to returning home.   Follow Up Recommendations        Equipment Recommendations       Recommendations for Other Services       Precautions / Restrictions Precautions Precautions: Fall Restrictions Weight Bearing Restrictions: No RLE Weight Bearing: Weight bearing as tolerated    Mobility  Bed Mobility Overal bed mobility: Needs Assistance Bed Mobility: Supine to Sit     Supine to sit: Min assist     General bed mobility comments: assist with LEs, multi-modal cues for sequencing of task, incr time and effort  Transfers Overall transfer level: Needs assistance Equipment used: Rolling walker (2 wheeled) Transfers: Sit to/from Stand Sit to Stand: Mod assist;Max assist Stand pivot transfers: Max assist       General transfer comment: 75% VC's on proper hand placement and safety with turns.  Also assisted with toilet transfer.  Unsteady.  Ambulation/Gait Ambulation/Gait assistance: Min assist;Mod assist Gait Distance (Feet): 18 Feet(to and from bathroom) Assistive device: Rolling walker (2 wheeled) Gait Pattern/deviations: Step-to pattern;Decreased stride length;Antalgic;Decreased weight shift to right;Decreased step length -  left;Decreased step length - right Gait velocity: decreased   General Gait Details: very unsteady gait with assist to safely navigate walker around bed and 75% VC's on proper walker to self distance and safety with turns.  Increased assist in bathroom.   Stairs             Wheelchair Mobility    Modified Rankin (Stroke Patients Only)       Balance                                            Cognition Arousal/Alertness: Awake/alert                                     General Comments: distracted, emotional, trying to call [name] to pay a late phone bill.  Difficulty focusing, processing multi steps.      Exercises      General Comments        Pertinent Vitals/Pain Pain Assessment: Faces Faces Pain Scale: Hurts little more Pain Location: Rt knee Pain Descriptors / Indicators: Aching;Burning;Grimacing;Operative site guarding Pain Intervention(s): Monitored during session;Premedicated before session;Repositioned;Ice applied    Home Living                      Prior Function            PT Goals (current goals can now be  found in the care plan section) Acute Rehab PT Goals Patient Stated Goal: home    Frequency           PT Plan      Co-evaluation              AM-PAC PT "6 Clicks" Mobility   Outcome Measure                   End of Session Equipment Utilized During Treatment: Gait belt Activity Tolerance: Patient limited by fatigue;Other (comment)(impaired focus) Patient left: with call bell/phone within reach;in bed;with bed alarm set Nurse Communication: Mobility status       Time: 1105-1130 PT Time Calculation (min) (ACUTE ONLY): 25 min  Charges:  $Gait Training: 8-22 mins $Therapeutic Activity: 8-22 mins                     [name]  PTA Acute  Rehabilitation Services Pager      [phone] Office      [phone]

## 2019-09-15 LAB — SARS CORONAVIRUS 2 BY RT PCR (HOSPITAL ORDER, PERFORMED IN ~~LOC~~ HOSPITAL LAB): SARS Coronavirus 2: NEGATIVE

## 2019-09-15 LAB — GLUCOSE, CAPILLARY
Glucose-Capillary: 234 mg/dL — ABNORMAL HIGH (ref 70–99)
Glucose-Capillary: 189 mg/dL — ABNORMAL HIGH (ref 70–99)
Glucose-Capillary: 144 mg/dL — ABNORMAL HIGH (ref 70–99)

## 2019-09-15 NOTE — Progress Notes (Signed)
Physical Therapy Treatment Patient Details Name: [name] MRN: [id number] DOB: 1951-05-05 Today's Date: 09/15/2019    History of Present Illness Pt is 68 y.o. female s/p Rt TKA on 09/11/19. She has PMH significant for DM, CPOD, cancer, multiple fractures, and anxety.    PT Comments    Pt tolerated transfer to and from [facility] with min assist and TKR exercises in supine today, limited by anxiety, pain, dizziness, and nausea. Pt with BP and HR of 132/63 and 82 bpm, respectively. Pt declines ambulation due to feeling poorly. Pt to d/c to SNF today.     Follow Up Recommendations  Follow surgeon's recommendation for DC plan and follow-up therapies;SNF;Supervision/Assistance - 24 hour     Equipment Recommendations  None recommended by PT    Recommendations for Other Services       Precautions / Restrictions Precautions Precautions: Fall Restrictions Weight Bearing Restrictions: No RLE Weight Bearing: Weight bearing as tolerated    Mobility  Bed Mobility Overal bed mobility: Needs Assistance Bed Mobility: Supine to Sit;Sit to Supine     Supine to sit: HOB elevated;Min assist Sit to supine: Mod assist;HOB elevated   General bed mobility comments: min assist for supine to sit for trunk elevation and scooting to EOB, increased time and effort. Mod assist for return to supine for LE lifting into bed, scooting up in bed with use of bed pads.  Transfers Overall transfer level: Needs assistance Equipment used: Rolling walker (2 wheeled) Transfers: Sit to/from [facility] Sit to Stand: Min assist;+2 safety/equipment Stand pivot transfers: Min assist;+2 safety/equipment       General transfer comment: Min assist for power up, steadying, VC x1 for hand placement when rising. Pt with stand pivot to [facility] with min assist for steadying, guiding to destination surface, and slow eccentric lowering as pt with very little eccentric control. Pt unable to ambulate this  session due to pt-reported dizziness and severe nausea.  Ambulation/Gait Ambulation/Gait assistance: (NT)               Stairs             Wheelchair Mobility    Modified Rankin (Stroke Patients Only)       Balance Overall balance assessment: Needs assistance Sitting-balance support: Feet supported;Single extremity supported Sitting balance-Leahy Scale: Fair     Standing balance support: Bilateral upper extremity supported;During functional activity Standing balance-Leahy Scale: Poor Standing balance comment: reliant on external support                            Cognition Arousal/Alertness: Awake/alert Behavior During Therapy: WFL for tasks assessed/performed;Anxious Overall Cognitive Status: No family/caregiver present to determine baseline cognitive functioning Area of Impairment: Attention;Following commands;Safety/judgement;Problem solving                   Current Attention Level: Sustained   Following Commands: Follows one step commands with increased time;Follows one step commands consistently Safety/Judgement: Decreased awareness of safety;Decreased awareness of deficits   Problem Solving: Slow processing;Requires verbal cues;Requires tactile cues        Exercises Total Joint Exercises Ankle Circles/Pumps: AROM;Both;Supine;15 reps Quad Sets: AROM;Right;10 reps;Supine Heel Slides: AAROM;Right;10 reps;Supine Hip ABduction/ADduction: AAROM;Right;10 reps;Supine    General Comments        Pertinent Vitals/Pain Pain Assessment: 0-10 Pain Score: 7  Pain Location: Rt knee Pain Descriptors / Indicators: Grimacing;Sore Pain Intervention(s): Limited activity within patient's tolerance;Monitored during session;Premedicated before session;Repositioned;Ice applied  Home Living                      Prior Function            PT Goals (current goals can now be found in the care plan section) Acute Rehab PT  Goals Patient Stated Goal: home PT Goal Formulation: With patient Time For Goal Achievement: 09/18/19 Potential to Achieve Goals: Fair Progress towards PT goals: Progressing toward goals    Frequency    7X/week      PT Plan Current plan remains appropriate    Co-evaluation              AM-PAC PT "6 Clicks" Mobility   Outcome Measure  Help needed turning from your back to your side while in a flat bed without using bedrails?: A Little Help needed moving from lying on your back to sitting on the side of a flat bed without using bedrails?: A Lot Help needed moving to and from a bed to a chair (including a wheelchair)?: A Little Help needed standing up from a chair using your arms (e.g., wheelchair or bedside chair)?: A Little Help needed to walk in hospital room?: A Lot Help needed climbing 3-5 steps with a railing? : Total 6 Click Score: 14    End of Session Equipment Utilized During Treatment: Gait belt Activity Tolerance: Patient limited by fatigue;Other (comment) Patient left: with call bell/phone within reach;in bed;with bed alarm set Nurse Communication: Mobility status PT Visit Diagnosis: Unsteadiness on feet (R26.81);Other abnormalities of gait and mobility (R26.89)     Time: [id number] PT Time Calculation (min) (ACUTE ONLY): 31 min  Charges:  $Therapeutic Exercise: 8-22 mins $Therapeutic Activity: 8-22 mins                     Dyane Broberg Conception Chancy, PT Acute Rehabilitation Services Pager [phone]  Office [phone]    Sameka Bagent D Elonda Husky 09/15/2019, 1:41 PM

## 2019-09-15 NOTE — Plan of Care (Signed)
Plan of care reviewed and discussed with the patient. 

## 2019-09-15 NOTE — TOC Transition Note (Addendum)
Transition of Care [facility]) - CM/SW Discharge Note   Patient Details  Name: [name] MRN: [id number] Date of Birth: 1951/03/13  [facility]) CM/SW Contact:  Leeroy Cha, RN Phone Number: 09/15/2019, 3:19 PM   Clinical Narrative:  Josem Kaufmann for snf admission obtained.  Audelia Hives at [facility] room is 121A ptar called for transport at 1330 pick up time is 1800.  Report number and packet given to floor rn.   Final next level of care: Skilled Nursing Facility Barriers to Discharge: Barriers Resolved, No Barriers Identified   Patient Goals and CMS Choice Patient states their goals for this hospitalization and ongoing recovery are:: to go home CMS Medicare.gov Compare Post Acute Care list provided to:: Patient Choice offered to / list presented to : Patient  Discharge Placement                       Discharge Plan and Services   Discharge Planning Services: CM Consult Post Acute Care Choice: Home Health          DME Arranged: N/A DME Agency: NA       HH Arranged: PT [place] Agency: [facility] ([facility] [employer]) Date [place]: 09/12/19 Time [place]: 72 Representative spoke with at [place]: pre arranged in MD office  Social Determinants of Health (SDOH) Interventions     Readmission Risk Interventions No flowsheet data found.

## 2020-05-17 DIAGNOSIS — E538 Deficiency of other specified B group vitamins: Secondary | ICD-10-CM | POA: Diagnosis not present

## 2020-06-14 DIAGNOSIS — E538 Deficiency of other specified B group vitamins: Secondary | ICD-10-CM | POA: Diagnosis not present

## 2020-06-21 DIAGNOSIS — E538 Deficiency of other specified B group vitamins: Secondary | ICD-10-CM | POA: Diagnosis not present

## 2020-06-21 DIAGNOSIS — S0990XA Unspecified injury of head, initial encounter: Secondary | ICD-10-CM | POA: Diagnosis not present

## 2020-06-21 DIAGNOSIS — R519 Headache, unspecified: Secondary | ICD-10-CM | POA: Diagnosis not present

## 2020-06-21 DIAGNOSIS — Z87891 Personal history of nicotine dependence: Secondary | ICD-10-CM | POA: Diagnosis not present

## 2020-06-21 DIAGNOSIS — M25551 Pain in right hip: Secondary | ICD-10-CM | POA: Diagnosis not present

## 2020-06-21 DIAGNOSIS — S3210XA Unspecified fracture of sacrum, initial encounter for closed fracture: Secondary | ICD-10-CM | POA: Diagnosis not present

## 2020-06-21 DIAGNOSIS — W19XXXA Unspecified fall, initial encounter: Secondary | ICD-10-CM | POA: Diagnosis not present

## 2020-06-21 DIAGNOSIS — F419 Anxiety disorder, unspecified: Secondary | ICD-10-CM | POA: Diagnosis not present

## 2020-06-21 DIAGNOSIS — K2100 Gastro-esophageal reflux disease with esophagitis, without bleeding: Secondary | ICD-10-CM | POA: Diagnosis not present

## 2020-06-21 DIAGNOSIS — Z743 Need for continuous supervision: Secondary | ICD-10-CM | POA: Diagnosis not present

## 2020-06-21 DIAGNOSIS — S299XXA Unspecified injury of thorax, initial encounter: Secondary | ICD-10-CM | POA: Diagnosis not present

## 2020-06-21 DIAGNOSIS — E782 Mixed hyperlipidemia: Secondary | ICD-10-CM | POA: Diagnosis not present

## 2020-06-21 DIAGNOSIS — K219 Gastro-esophageal reflux disease without esophagitis: Secondary | ICD-10-CM | POA: Diagnosis not present

## 2020-06-21 DIAGNOSIS — S139XXA Sprain of joints and ligaments of unspecified parts of neck, initial encounter: Secondary | ICD-10-CM | POA: Diagnosis not present

## 2020-06-21 DIAGNOSIS — S322XXA Fracture of coccyx, initial encounter for closed fracture: Secondary | ICD-10-CM | POA: Diagnosis not present

## 2020-06-21 DIAGNOSIS — R5383 Other fatigue: Secondary | ICD-10-CM | POA: Diagnosis not present

## 2020-06-21 DIAGNOSIS — M533 Sacrococcygeal disorders, not elsewhere classified: Secondary | ICD-10-CM | POA: Diagnosis not present

## 2020-06-21 DIAGNOSIS — E039 Hypothyroidism, unspecified: Secondary | ICD-10-CM | POA: Diagnosis not present

## 2020-06-21 DIAGNOSIS — Z90710 Acquired absence of both cervix and uterus: Secondary | ICD-10-CM | POA: Diagnosis not present

## 2020-06-21 DIAGNOSIS — E1165 Type 2 diabetes mellitus with hyperglycemia: Secondary | ICD-10-CM | POA: Diagnosis not present

## 2020-06-21 DIAGNOSIS — M1611 Unilateral primary osteoarthritis, right hip: Secondary | ICD-10-CM | POA: Diagnosis not present

## 2020-06-21 DIAGNOSIS — M47817 Spondylosis without myelopathy or radiculopathy, lumbosacral region: Secondary | ICD-10-CM | POA: Diagnosis not present

## 2020-06-21 DIAGNOSIS — M47816 Spondylosis without myelopathy or radiculopathy, lumbar region: Secondary | ICD-10-CM | POA: Diagnosis not present

## 2020-06-21 DIAGNOSIS — S79911A Unspecified injury of right hip, initial encounter: Secondary | ICD-10-CM | POA: Diagnosis not present

## 2020-06-21 DIAGNOSIS — E119 Type 2 diabetes mellitus without complications: Secondary | ICD-10-CM | POA: Diagnosis not present

## 2020-06-21 DIAGNOSIS — S039XXA Sprain of joints and ligaments of unspecified parts of head, initial encounter: Secondary | ICD-10-CM | POA: Diagnosis not present

## 2020-06-21 DIAGNOSIS — E785 Hyperlipidemia, unspecified: Secondary | ICD-10-CM | POA: Diagnosis not present

## 2020-06-21 DIAGNOSIS — M25572 Pain in left ankle and joints of left foot: Secondary | ICD-10-CM | POA: Diagnosis not present

## 2020-06-23 DIAGNOSIS — E1165 Type 2 diabetes mellitus with hyperglycemia: Secondary | ICD-10-CM | POA: Diagnosis not present

## 2020-06-23 DIAGNOSIS — E559 Vitamin D deficiency, unspecified: Secondary | ICD-10-CM | POA: Diagnosis not present

## 2020-06-23 DIAGNOSIS — F411 Generalized anxiety disorder: Secondary | ICD-10-CM | POA: Diagnosis not present

## 2020-06-23 DIAGNOSIS — S3210XA Unspecified fracture of sacrum, initial encounter for closed fracture: Secondary | ICD-10-CM | POA: Diagnosis not present

## 2020-06-23 DIAGNOSIS — R32 Unspecified urinary incontinence: Secondary | ICD-10-CM | POA: Diagnosis not present

## 2020-06-23 DIAGNOSIS — N3946 Mixed incontinence: Secondary | ICD-10-CM | POA: Diagnosis not present

## 2020-06-23 DIAGNOSIS — E782 Mixed hyperlipidemia: Secondary | ICD-10-CM | POA: Diagnosis not present

## 2020-06-23 DIAGNOSIS — Z6834 Body mass index (BMI) 34.0-34.9, adult: Secondary | ICD-10-CM | POA: Diagnosis not present

## 2020-07-14 DIAGNOSIS — E538 Deficiency of other specified B group vitamins: Secondary | ICD-10-CM | POA: Diagnosis not present

## 2020-07-25 DIAGNOSIS — Z20828 Contact with and (suspected) exposure to other viral communicable diseases: Secondary | ICD-10-CM | POA: Diagnosis not present

## 2020-07-25 DIAGNOSIS — Z111 Encounter for screening for respiratory tuberculosis: Secondary | ICD-10-CM | POA: Diagnosis not present

## 2020-08-02 DIAGNOSIS — E1142 Type 2 diabetes mellitus with diabetic polyneuropathy: Secondary | ICD-10-CM | POA: Diagnosis not present

## 2020-08-02 DIAGNOSIS — L84 Corns and callosities: Secondary | ICD-10-CM | POA: Diagnosis not present

## 2020-08-02 DIAGNOSIS — M79676 Pain in unspecified toe(s): Secondary | ICD-10-CM | POA: Diagnosis not present

## 2020-08-02 DIAGNOSIS — B351 Tinea unguium: Secondary | ICD-10-CM | POA: Diagnosis not present

## 2020-08-08 DIAGNOSIS — R0689 Other abnormalities of breathing: Secondary | ICD-10-CM | POA: Diagnosis not present

## 2020-08-08 DIAGNOSIS — R404 Transient alteration of awareness: Secondary | ICD-10-CM | POA: Diagnosis not present

## 2020-08-08 DIAGNOSIS — R079 Chest pain, unspecified: Secondary | ICD-10-CM | POA: Diagnosis not present

## 2020-08-08 DIAGNOSIS — E119 Type 2 diabetes mellitus without complications: Secondary | ICD-10-CM | POA: Diagnosis not present

## 2020-08-08 DIAGNOSIS — Z743 Need for continuous supervision: Secondary | ICD-10-CM | POA: Diagnosis not present

## 2020-08-08 DIAGNOSIS — Z885 Allergy status to narcotic agent status: Secondary | ICD-10-CM | POA: Diagnosis not present

## 2020-08-08 DIAGNOSIS — K2900 Acute gastritis without bleeding: Secondary | ICD-10-CM | POA: Diagnosis not present

## 2020-08-08 DIAGNOSIS — Z882 Allergy status to sulfonamides status: Secondary | ICD-10-CM | POA: Diagnosis not present

## 2020-08-08 DIAGNOSIS — I700 Atherosclerosis of aorta: Secondary | ICD-10-CM | POA: Diagnosis not present

## 2020-08-08 DIAGNOSIS — R0789 Other chest pain: Secondary | ICD-10-CM | POA: Diagnosis not present

## 2020-08-08 DIAGNOSIS — R10811 Right upper quadrant abdominal tenderness: Secondary | ICD-10-CM | POA: Diagnosis not present

## 2020-08-08 DIAGNOSIS — N390 Urinary tract infection, site not specified: Secondary | ICD-10-CM | POA: Diagnosis not present

## 2020-08-08 DIAGNOSIS — R1013 Epigastric pain: Secondary | ICD-10-CM | POA: Diagnosis not present

## 2020-08-08 DIAGNOSIS — Z794 Long term (current) use of insulin: Secondary | ICD-10-CM | POA: Diagnosis not present

## 2020-08-10 DIAGNOSIS — R269 Unspecified abnormalities of gait and mobility: Secondary | ICD-10-CM | POA: Diagnosis not present

## 2020-08-10 DIAGNOSIS — R5383 Other fatigue: Secondary | ICD-10-CM | POA: Diagnosis not present

## 2020-08-10 DIAGNOSIS — Z6834 Body mass index (BMI) 34.0-34.9, adult: Secondary | ICD-10-CM | POA: Diagnosis not present

## 2020-08-10 DIAGNOSIS — R531 Weakness: Secondary | ICD-10-CM | POA: Diagnosis not present

## 2020-08-10 DIAGNOSIS — R2689 Other abnormalities of gait and mobility: Secondary | ICD-10-CM | POA: Diagnosis not present

## 2020-08-10 DIAGNOSIS — F411 Generalized anxiety disorder: Secondary | ICD-10-CM | POA: Diagnosis not present

## 2020-08-10 DIAGNOSIS — E1165 Type 2 diabetes mellitus with hyperglycemia: Secondary | ICD-10-CM | POA: Diagnosis not present

## 2020-08-10 DIAGNOSIS — R413 Other amnesia: Secondary | ICD-10-CM | POA: Diagnosis not present

## 2020-08-18 DIAGNOSIS — E039 Hypothyroidism, unspecified: Secondary | ICD-10-CM | POA: Diagnosis not present

## 2020-08-18 DIAGNOSIS — M546 Pain in thoracic spine: Secondary | ICD-10-CM | POA: Diagnosis not present

## 2020-08-18 DIAGNOSIS — R531 Weakness: Secondary | ICD-10-CM | POA: Diagnosis not present

## 2020-08-18 DIAGNOSIS — F329 Major depressive disorder, single episode, unspecified: Secondary | ICD-10-CM | POA: Diagnosis not present

## 2020-08-18 DIAGNOSIS — F411 Generalized anxiety disorder: Secondary | ICD-10-CM | POA: Diagnosis not present

## 2020-08-18 DIAGNOSIS — E1165 Type 2 diabetes mellitus with hyperglycemia: Secondary | ICD-10-CM | POA: Diagnosis not present

## 2020-08-18 DIAGNOSIS — Z794 Long term (current) use of insulin: Secondary | ICD-10-CM | POA: Diagnosis not present

## 2020-08-18 DIAGNOSIS — E1142 Type 2 diabetes mellitus with diabetic polyneuropathy: Secondary | ICD-10-CM | POA: Diagnosis not present

## 2020-08-18 DIAGNOSIS — N390 Urinary tract infection, site not specified: Secondary | ICD-10-CM | POA: Diagnosis not present

## 2020-08-22 DIAGNOSIS — F419 Anxiety disorder, unspecified: Secondary | ICD-10-CM | POA: Diagnosis not present

## 2020-08-22 DIAGNOSIS — F329 Major depressive disorder, single episode, unspecified: Secondary | ICD-10-CM | POA: Diagnosis not present

## 2020-08-22 DIAGNOSIS — F5101 Primary insomnia: Secondary | ICD-10-CM | POA: Diagnosis not present

## 2020-08-23 DIAGNOSIS — Z794 Long term (current) use of insulin: Secondary | ICD-10-CM | POA: Diagnosis not present

## 2020-08-23 DIAGNOSIS — R531 Weakness: Secondary | ICD-10-CM | POA: Diagnosis not present

## 2020-08-23 DIAGNOSIS — E1165 Type 2 diabetes mellitus with hyperglycemia: Secondary | ICD-10-CM | POA: Diagnosis not present

## 2020-08-23 DIAGNOSIS — F329 Major depressive disorder, single episode, unspecified: Secondary | ICD-10-CM | POA: Diagnosis not present

## 2020-08-23 DIAGNOSIS — E039 Hypothyroidism, unspecified: Secondary | ICD-10-CM | POA: Diagnosis not present

## 2020-08-23 DIAGNOSIS — E1142 Type 2 diabetes mellitus with diabetic polyneuropathy: Secondary | ICD-10-CM | POA: Diagnosis not present

## 2020-08-23 DIAGNOSIS — F411 Generalized anxiety disorder: Secondary | ICD-10-CM | POA: Diagnosis not present

## 2020-08-23 DIAGNOSIS — M546 Pain in thoracic spine: Secondary | ICD-10-CM | POA: Diagnosis not present

## 2020-08-23 DIAGNOSIS — N390 Urinary tract infection, site not specified: Secondary | ICD-10-CM | POA: Diagnosis not present

## 2020-08-25 DIAGNOSIS — N390 Urinary tract infection, site not specified: Secondary | ICD-10-CM | POA: Diagnosis not present

## 2020-08-25 DIAGNOSIS — M546 Pain in thoracic spine: Secondary | ICD-10-CM | POA: Diagnosis not present

## 2020-08-25 DIAGNOSIS — E1142 Type 2 diabetes mellitus with diabetic polyneuropathy: Secondary | ICD-10-CM | POA: Diagnosis not present

## 2020-08-25 DIAGNOSIS — E1165 Type 2 diabetes mellitus with hyperglycemia: Secondary | ICD-10-CM | POA: Diagnosis not present

## 2020-08-25 DIAGNOSIS — Z794 Long term (current) use of insulin: Secondary | ICD-10-CM | POA: Diagnosis not present

## 2020-08-25 DIAGNOSIS — R531 Weakness: Secondary | ICD-10-CM | POA: Diagnosis not present

## 2020-08-25 DIAGNOSIS — E039 Hypothyroidism, unspecified: Secondary | ICD-10-CM | POA: Diagnosis not present

## 2020-08-25 DIAGNOSIS — F419 Anxiety disorder, unspecified: Secondary | ICD-10-CM | POA: Diagnosis not present

## 2020-08-25 DIAGNOSIS — F329 Major depressive disorder, single episode, unspecified: Secondary | ICD-10-CM | POA: Diagnosis not present

## 2020-08-25 DIAGNOSIS — F411 Generalized anxiety disorder: Secondary | ICD-10-CM | POA: Diagnosis not present

## 2020-08-30 DIAGNOSIS — R531 Weakness: Secondary | ICD-10-CM | POA: Diagnosis not present

## 2020-08-30 DIAGNOSIS — F411 Generalized anxiety disorder: Secondary | ICD-10-CM | POA: Diagnosis not present

## 2020-08-30 DIAGNOSIS — Z794 Long term (current) use of insulin: Secondary | ICD-10-CM | POA: Diagnosis not present

## 2020-08-30 DIAGNOSIS — E1142 Type 2 diabetes mellitus with diabetic polyneuropathy: Secondary | ICD-10-CM | POA: Diagnosis not present

## 2020-08-30 DIAGNOSIS — F329 Major depressive disorder, single episode, unspecified: Secondary | ICD-10-CM | POA: Diagnosis not present

## 2020-08-30 DIAGNOSIS — M546 Pain in thoracic spine: Secondary | ICD-10-CM | POA: Diagnosis not present

## 2020-08-30 DIAGNOSIS — E1165 Type 2 diabetes mellitus with hyperglycemia: Secondary | ICD-10-CM | POA: Diagnosis not present

## 2020-08-30 DIAGNOSIS — N390 Urinary tract infection, site not specified: Secondary | ICD-10-CM | POA: Diagnosis not present

## 2020-08-30 DIAGNOSIS — E039 Hypothyroidism, unspecified: Secondary | ICD-10-CM | POA: Diagnosis not present

## 2020-09-02 DIAGNOSIS — M546 Pain in thoracic spine: Secondary | ICD-10-CM | POA: Diagnosis not present

## 2020-09-02 DIAGNOSIS — N390 Urinary tract infection, site not specified: Secondary | ICD-10-CM | POA: Diagnosis not present

## 2020-09-02 DIAGNOSIS — Z794 Long term (current) use of insulin: Secondary | ICD-10-CM | POA: Diagnosis not present

## 2020-09-02 DIAGNOSIS — F411 Generalized anxiety disorder: Secondary | ICD-10-CM | POA: Diagnosis not present

## 2020-09-02 DIAGNOSIS — R531 Weakness: Secondary | ICD-10-CM | POA: Diagnosis not present

## 2020-09-02 DIAGNOSIS — E039 Hypothyroidism, unspecified: Secondary | ICD-10-CM | POA: Diagnosis not present

## 2020-09-02 DIAGNOSIS — E1142 Type 2 diabetes mellitus with diabetic polyneuropathy: Secondary | ICD-10-CM | POA: Diagnosis not present

## 2020-09-02 DIAGNOSIS — E1165 Type 2 diabetes mellitus with hyperglycemia: Secondary | ICD-10-CM | POA: Diagnosis not present

## 2020-09-02 DIAGNOSIS — F329 Major depressive disorder, single episode, unspecified: Secondary | ICD-10-CM | POA: Diagnosis not present

## 2020-09-06 DIAGNOSIS — E1142 Type 2 diabetes mellitus with diabetic polyneuropathy: Secondary | ICD-10-CM | POA: Diagnosis not present

## 2020-09-06 DIAGNOSIS — R531 Weakness: Secondary | ICD-10-CM | POA: Diagnosis not present

## 2020-09-06 DIAGNOSIS — F329 Major depressive disorder, single episode, unspecified: Secondary | ICD-10-CM | POA: Diagnosis not present

## 2020-09-06 DIAGNOSIS — N390 Urinary tract infection, site not specified: Secondary | ICD-10-CM | POA: Diagnosis not present

## 2020-09-06 DIAGNOSIS — E039 Hypothyroidism, unspecified: Secondary | ICD-10-CM | POA: Diagnosis not present

## 2020-09-06 DIAGNOSIS — E1165 Type 2 diabetes mellitus with hyperglycemia: Secondary | ICD-10-CM | POA: Diagnosis not present

## 2020-09-06 DIAGNOSIS — M546 Pain in thoracic spine: Secondary | ICD-10-CM | POA: Diagnosis not present

## 2020-09-06 DIAGNOSIS — F411 Generalized anxiety disorder: Secondary | ICD-10-CM | POA: Diagnosis not present

## 2020-09-06 DIAGNOSIS — Z794 Long term (current) use of insulin: Secondary | ICD-10-CM | POA: Diagnosis not present

## 2020-09-08 DIAGNOSIS — F411 Generalized anxiety disorder: Secondary | ICD-10-CM | POA: Diagnosis not present

## 2020-09-08 DIAGNOSIS — E1142 Type 2 diabetes mellitus with diabetic polyneuropathy: Secondary | ICD-10-CM | POA: Diagnosis not present

## 2020-09-08 DIAGNOSIS — M546 Pain in thoracic spine: Secondary | ICD-10-CM | POA: Diagnosis not present

## 2020-09-08 DIAGNOSIS — E039 Hypothyroidism, unspecified: Secondary | ICD-10-CM | POA: Diagnosis not present

## 2020-09-08 DIAGNOSIS — E1165 Type 2 diabetes mellitus with hyperglycemia: Secondary | ICD-10-CM | POA: Diagnosis not present

## 2020-09-08 DIAGNOSIS — R531 Weakness: Secondary | ICD-10-CM | POA: Diagnosis not present

## 2020-09-08 DIAGNOSIS — F329 Major depressive disorder, single episode, unspecified: Secondary | ICD-10-CM | POA: Diagnosis not present

## 2020-09-08 DIAGNOSIS — N390 Urinary tract infection, site not specified: Secondary | ICD-10-CM | POA: Diagnosis not present

## 2020-09-08 DIAGNOSIS — Z794 Long term (current) use of insulin: Secondary | ICD-10-CM | POA: Diagnosis not present

## 2020-09-09 DIAGNOSIS — R531 Weakness: Secondary | ICD-10-CM | POA: Diagnosis not present

## 2020-09-09 DIAGNOSIS — N390 Urinary tract infection, site not specified: Secondary | ICD-10-CM | POA: Diagnosis not present

## 2020-09-09 DIAGNOSIS — F329 Major depressive disorder, single episode, unspecified: Secondary | ICD-10-CM | POA: Diagnosis not present

## 2020-09-09 DIAGNOSIS — F411 Generalized anxiety disorder: Secondary | ICD-10-CM | POA: Diagnosis not present

## 2020-09-09 DIAGNOSIS — E1142 Type 2 diabetes mellitus with diabetic polyneuropathy: Secondary | ICD-10-CM | POA: Diagnosis not present

## 2020-09-09 DIAGNOSIS — E1165 Type 2 diabetes mellitus with hyperglycemia: Secondary | ICD-10-CM | POA: Diagnosis not present

## 2020-09-09 DIAGNOSIS — M546 Pain in thoracic spine: Secondary | ICD-10-CM | POA: Diagnosis not present

## 2020-09-09 DIAGNOSIS — E039 Hypothyroidism, unspecified: Secondary | ICD-10-CM | POA: Diagnosis not present

## 2020-09-12 DIAGNOSIS — E538 Deficiency of other specified B group vitamins: Secondary | ICD-10-CM | POA: Diagnosis not present

## 2020-09-13 DIAGNOSIS — F411 Generalized anxiety disorder: Secondary | ICD-10-CM | POA: Diagnosis not present

## 2020-09-13 DIAGNOSIS — R531 Weakness: Secondary | ICD-10-CM | POA: Diagnosis not present

## 2020-09-13 DIAGNOSIS — F329 Major depressive disorder, single episode, unspecified: Secondary | ICD-10-CM | POA: Diagnosis not present

## 2020-09-13 DIAGNOSIS — E1142 Type 2 diabetes mellitus with diabetic polyneuropathy: Secondary | ICD-10-CM | POA: Diagnosis not present

## 2020-09-13 DIAGNOSIS — E1165 Type 2 diabetes mellitus with hyperglycemia: Secondary | ICD-10-CM | POA: Diagnosis not present

## 2020-09-13 DIAGNOSIS — Z794 Long term (current) use of insulin: Secondary | ICD-10-CM | POA: Diagnosis not present

## 2020-09-13 DIAGNOSIS — M546 Pain in thoracic spine: Secondary | ICD-10-CM | POA: Diagnosis not present

## 2020-09-13 DIAGNOSIS — E039 Hypothyroidism, unspecified: Secondary | ICD-10-CM | POA: Diagnosis not present

## 2020-09-13 DIAGNOSIS — N390 Urinary tract infection, site not specified: Secondary | ICD-10-CM | POA: Diagnosis not present

## 2020-09-13 DIAGNOSIS — F419 Anxiety disorder, unspecified: Secondary | ICD-10-CM | POA: Diagnosis not present

## 2020-09-15 DIAGNOSIS — Z794 Long term (current) use of insulin: Secondary | ICD-10-CM | POA: Diagnosis not present

## 2020-09-15 DIAGNOSIS — F411 Generalized anxiety disorder: Secondary | ICD-10-CM | POA: Diagnosis not present

## 2020-09-15 DIAGNOSIS — R531 Weakness: Secondary | ICD-10-CM | POA: Diagnosis not present

## 2020-09-15 DIAGNOSIS — M546 Pain in thoracic spine: Secondary | ICD-10-CM | POA: Diagnosis not present

## 2020-09-15 DIAGNOSIS — E1142 Type 2 diabetes mellitus with diabetic polyneuropathy: Secondary | ICD-10-CM | POA: Diagnosis not present

## 2020-09-15 DIAGNOSIS — N390 Urinary tract infection, site not specified: Secondary | ICD-10-CM | POA: Diagnosis not present

## 2020-09-15 DIAGNOSIS — F329 Major depressive disorder, single episode, unspecified: Secondary | ICD-10-CM | POA: Diagnosis not present

## 2020-09-15 DIAGNOSIS — E1165 Type 2 diabetes mellitus with hyperglycemia: Secondary | ICD-10-CM | POA: Diagnosis not present

## 2020-09-15 DIAGNOSIS — E039 Hypothyroidism, unspecified: Secondary | ICD-10-CM | POA: Diagnosis not present

## 2020-09-19 DIAGNOSIS — N390 Urinary tract infection, site not specified: Secondary | ICD-10-CM | POA: Diagnosis not present

## 2020-09-19 DIAGNOSIS — E039 Hypothyroidism, unspecified: Secondary | ICD-10-CM | POA: Diagnosis not present

## 2020-09-19 DIAGNOSIS — E1165 Type 2 diabetes mellitus with hyperglycemia: Secondary | ICD-10-CM | POA: Diagnosis not present

## 2020-09-19 DIAGNOSIS — M546 Pain in thoracic spine: Secondary | ICD-10-CM | POA: Diagnosis not present

## 2020-09-19 DIAGNOSIS — F411 Generalized anxiety disorder: Secondary | ICD-10-CM | POA: Diagnosis not present

## 2020-09-19 DIAGNOSIS — Z794 Long term (current) use of insulin: Secondary | ICD-10-CM | POA: Diagnosis not present

## 2020-09-19 DIAGNOSIS — E1142 Type 2 diabetes mellitus with diabetic polyneuropathy: Secondary | ICD-10-CM | POA: Diagnosis not present

## 2020-09-19 DIAGNOSIS — F329 Major depressive disorder, single episode, unspecified: Secondary | ICD-10-CM | POA: Diagnosis not present

## 2020-09-19 DIAGNOSIS — R531 Weakness: Secondary | ICD-10-CM | POA: Diagnosis not present

## 2020-09-26 DIAGNOSIS — M546 Pain in thoracic spine: Secondary | ICD-10-CM | POA: Diagnosis not present

## 2020-09-26 DIAGNOSIS — N390 Urinary tract infection, site not specified: Secondary | ICD-10-CM | POA: Diagnosis not present

## 2020-09-26 DIAGNOSIS — F411 Generalized anxiety disorder: Secondary | ICD-10-CM | POA: Diagnosis not present

## 2020-09-26 DIAGNOSIS — E1142 Type 2 diabetes mellitus with diabetic polyneuropathy: Secondary | ICD-10-CM | POA: Diagnosis not present

## 2020-09-26 DIAGNOSIS — E1165 Type 2 diabetes mellitus with hyperglycemia: Secondary | ICD-10-CM | POA: Diagnosis not present

## 2020-09-26 DIAGNOSIS — Z794 Long term (current) use of insulin: Secondary | ICD-10-CM | POA: Diagnosis not present

## 2020-09-26 DIAGNOSIS — R531 Weakness: Secondary | ICD-10-CM | POA: Diagnosis not present

## 2020-09-26 DIAGNOSIS — E039 Hypothyroidism, unspecified: Secondary | ICD-10-CM | POA: Diagnosis not present

## 2020-09-26 DIAGNOSIS — F329 Major depressive disorder, single episode, unspecified: Secondary | ICD-10-CM | POA: Diagnosis not present

## 2020-09-27 DIAGNOSIS — F329 Major depressive disorder, single episode, unspecified: Secondary | ICD-10-CM | POA: Diagnosis not present

## 2020-09-27 DIAGNOSIS — F419 Anxiety disorder, unspecified: Secondary | ICD-10-CM | POA: Diagnosis not present

## 2020-09-29 DIAGNOSIS — F329 Major depressive disorder, single episode, unspecified: Secondary | ICD-10-CM | POA: Diagnosis not present

## 2020-09-29 DIAGNOSIS — F419 Anxiety disorder, unspecified: Secondary | ICD-10-CM | POA: Diagnosis not present

## 2020-09-29 DIAGNOSIS — F5101 Primary insomnia: Secondary | ICD-10-CM | POA: Diagnosis not present

## 2020-10-03 DIAGNOSIS — N390 Urinary tract infection, site not specified: Secondary | ICD-10-CM | POA: Diagnosis not present

## 2020-10-03 DIAGNOSIS — F329 Major depressive disorder, single episode, unspecified: Secondary | ICD-10-CM | POA: Diagnosis not present

## 2020-10-03 DIAGNOSIS — M546 Pain in thoracic spine: Secondary | ICD-10-CM | POA: Diagnosis not present

## 2020-10-03 DIAGNOSIS — E1165 Type 2 diabetes mellitus with hyperglycemia: Secondary | ICD-10-CM | POA: Diagnosis not present

## 2020-10-03 DIAGNOSIS — E039 Hypothyroidism, unspecified: Secondary | ICD-10-CM | POA: Diagnosis not present

## 2020-10-03 DIAGNOSIS — F411 Generalized anxiety disorder: Secondary | ICD-10-CM | POA: Diagnosis not present

## 2020-10-03 DIAGNOSIS — Z794 Long term (current) use of insulin: Secondary | ICD-10-CM | POA: Diagnosis not present

## 2020-10-03 DIAGNOSIS — E1142 Type 2 diabetes mellitus with diabetic polyneuropathy: Secondary | ICD-10-CM | POA: Diagnosis not present

## 2020-10-03 DIAGNOSIS — R531 Weakness: Secondary | ICD-10-CM | POA: Diagnosis not present

## 2020-10-05 DIAGNOSIS — E782 Mixed hyperlipidemia: Secondary | ICD-10-CM | POA: Diagnosis not present

## 2020-10-05 DIAGNOSIS — E1165 Type 2 diabetes mellitus with hyperglycemia: Secondary | ICD-10-CM | POA: Diagnosis not present

## 2020-10-05 DIAGNOSIS — R5383 Other fatigue: Secondary | ICD-10-CM | POA: Diagnosis not present

## 2020-10-05 DIAGNOSIS — E1140 Type 2 diabetes mellitus with diabetic neuropathy, unspecified: Secondary | ICD-10-CM | POA: Diagnosis not present

## 2020-10-05 DIAGNOSIS — K2100 Gastro-esophageal reflux disease with esophagitis, without bleeding: Secondary | ICD-10-CM | POA: Diagnosis not present

## 2020-10-14 DIAGNOSIS — R32 Unspecified urinary incontinence: Secondary | ICD-10-CM | POA: Diagnosis not present

## 2020-10-14 DIAGNOSIS — S3210XA Unspecified fracture of sacrum, initial encounter for closed fracture: Secondary | ICD-10-CM | POA: Diagnosis not present

## 2020-10-14 DIAGNOSIS — E1140 Type 2 diabetes mellitus with diabetic neuropathy, unspecified: Secondary | ICD-10-CM | POA: Diagnosis not present

## 2020-10-14 DIAGNOSIS — Z6834 Body mass index (BMI) 34.0-34.9, adult: Secondary | ICD-10-CM | POA: Diagnosis not present

## 2020-10-14 DIAGNOSIS — E538 Deficiency of other specified B group vitamins: Secondary | ICD-10-CM | POA: Diagnosis not present

## 2020-10-14 DIAGNOSIS — E782 Mixed hyperlipidemia: Secondary | ICD-10-CM | POA: Diagnosis not present

## 2020-10-14 DIAGNOSIS — R269 Unspecified abnormalities of gait and mobility: Secondary | ICD-10-CM | POA: Diagnosis not present

## 2020-10-14 DIAGNOSIS — E1165 Type 2 diabetes mellitus with hyperglycemia: Secondary | ICD-10-CM | POA: Diagnosis not present

## 2020-11-01 DIAGNOSIS — E1142 Type 2 diabetes mellitus with diabetic polyneuropathy: Secondary | ICD-10-CM | POA: Diagnosis not present

## 2020-11-01 DIAGNOSIS — M79676 Pain in unspecified toe(s): Secondary | ICD-10-CM | POA: Diagnosis not present

## 2020-11-01 DIAGNOSIS — L84 Corns and callosities: Secondary | ICD-10-CM | POA: Diagnosis not present

## 2020-11-01 DIAGNOSIS — B351 Tinea unguium: Secondary | ICD-10-CM | POA: Diagnosis not present

## 2020-11-02 DIAGNOSIS — F419 Anxiety disorder, unspecified: Secondary | ICD-10-CM | POA: Diagnosis not present

## 2020-11-02 DIAGNOSIS — F5101 Primary insomnia: Secondary | ICD-10-CM | POA: Diagnosis not present

## 2020-11-02 DIAGNOSIS — F329 Major depressive disorder, single episode, unspecified: Secondary | ICD-10-CM | POA: Diagnosis not present

## 2020-11-22 DIAGNOSIS — F419 Anxiety disorder, unspecified: Secondary | ICD-10-CM | POA: Diagnosis not present

## 2020-11-22 DIAGNOSIS — F329 Major depressive disorder, single episode, unspecified: Secondary | ICD-10-CM | POA: Diagnosis not present

## 2020-12-01 DIAGNOSIS — F419 Anxiety disorder, unspecified: Secondary | ICD-10-CM | POA: Diagnosis not present

## 2020-12-01 DIAGNOSIS — F329 Major depressive disorder, single episode, unspecified: Secondary | ICD-10-CM | POA: Diagnosis not present

## 2020-12-01 DIAGNOSIS — F5101 Primary insomnia: Secondary | ICD-10-CM | POA: Diagnosis not present

## 2020-12-06 DIAGNOSIS — F419 Anxiety disorder, unspecified: Secondary | ICD-10-CM | POA: Diagnosis not present

## 2020-12-06 DIAGNOSIS — F329 Major depressive disorder, single episode, unspecified: Secondary | ICD-10-CM | POA: Diagnosis not present

## 2020-12-27 DIAGNOSIS — F32A Depression, unspecified: Secondary | ICD-10-CM | POA: Diagnosis not present

## 2020-12-27 DIAGNOSIS — R32 Unspecified urinary incontinence: Secondary | ICD-10-CM | POA: Diagnosis not present

## 2020-12-27 DIAGNOSIS — R269 Unspecified abnormalities of gait and mobility: Secondary | ICD-10-CM | POA: Diagnosis not present

## 2020-12-27 DIAGNOSIS — R413 Other amnesia: Secondary | ICD-10-CM | POA: Diagnosis not present

## 2020-12-27 DIAGNOSIS — F419 Anxiety disorder, unspecified: Secondary | ICD-10-CM | POA: Diagnosis not present

## 2020-12-29 DIAGNOSIS — D518 Other vitamin B12 deficiency anemias: Secondary | ICD-10-CM | POA: Diagnosis not present

## 2020-12-29 DIAGNOSIS — Z79899 Other long term (current) drug therapy: Secondary | ICD-10-CM | POA: Diagnosis not present

## 2020-12-29 DIAGNOSIS — E038 Other specified hypothyroidism: Secondary | ICD-10-CM | POA: Diagnosis not present

## 2020-12-29 DIAGNOSIS — E119 Type 2 diabetes mellitus without complications: Secondary | ICD-10-CM | POA: Diagnosis not present

## 2020-12-29 DIAGNOSIS — E559 Vitamin D deficiency, unspecified: Secondary | ICD-10-CM | POA: Diagnosis not present

## 2020-12-29 DIAGNOSIS — E7849 Other hyperlipidemia: Secondary | ICD-10-CM | POA: Diagnosis not present

## 2021-01-05 DIAGNOSIS — E038 Other specified hypothyroidism: Secondary | ICD-10-CM | POA: Diagnosis not present

## 2021-01-05 DIAGNOSIS — D518 Other vitamin B12 deficiency anemias: Secondary | ICD-10-CM | POA: Diagnosis not present

## 2021-01-05 DIAGNOSIS — E119 Type 2 diabetes mellitus without complications: Secondary | ICD-10-CM | POA: Diagnosis not present

## 2021-01-05 DIAGNOSIS — E559 Vitamin D deficiency, unspecified: Secondary | ICD-10-CM | POA: Diagnosis not present

## 2021-01-11 DIAGNOSIS — F329 Major depressive disorder, single episode, unspecified: Secondary | ICD-10-CM | POA: Diagnosis not present

## 2021-01-11 DIAGNOSIS — F419 Anxiety disorder, unspecified: Secondary | ICD-10-CM | POA: Diagnosis not present

## 2021-01-23 DIAGNOSIS — F5101 Primary insomnia: Secondary | ICD-10-CM | POA: Diagnosis not present

## 2021-01-23 DIAGNOSIS — F419 Anxiety disorder, unspecified: Secondary | ICD-10-CM | POA: Diagnosis not present

## 2021-01-23 DIAGNOSIS — F329 Major depressive disorder, single episode, unspecified: Secondary | ICD-10-CM | POA: Diagnosis not present

## 2021-01-24 DIAGNOSIS — R350 Frequency of micturition: Secondary | ICD-10-CM | POA: Diagnosis not present

## 2021-01-24 DIAGNOSIS — M79676 Pain in unspecified toe(s): Secondary | ICD-10-CM | POA: Diagnosis not present

## 2021-01-24 DIAGNOSIS — E1142 Type 2 diabetes mellitus with diabetic polyneuropathy: Secondary | ICD-10-CM | POA: Diagnosis not present

## 2021-01-24 DIAGNOSIS — L84 Corns and callosities: Secondary | ICD-10-CM | POA: Diagnosis not present

## 2021-01-24 DIAGNOSIS — B351 Tinea unguium: Secondary | ICD-10-CM | POA: Diagnosis not present

## 2021-01-24 DIAGNOSIS — R269 Unspecified abnormalities of gait and mobility: Secondary | ICD-10-CM | POA: Diagnosis not present

## 2021-01-24 DIAGNOSIS — F419 Anxiety disorder, unspecified: Secondary | ICD-10-CM | POA: Diagnosis not present

## 2021-01-24 DIAGNOSIS — F32A Depression, unspecified: Secondary | ICD-10-CM | POA: Diagnosis not present

## 2021-01-24 DIAGNOSIS — R413 Other amnesia: Secondary | ICD-10-CM | POA: Diagnosis not present

## 2021-01-25 DIAGNOSIS — E559 Vitamin D deficiency, unspecified: Secondary | ICD-10-CM | POA: Diagnosis not present

## 2021-01-25 DIAGNOSIS — R350 Frequency of micturition: Secondary | ICD-10-CM | POA: Diagnosis not present

## 2021-01-25 DIAGNOSIS — E7849 Other hyperlipidemia: Secondary | ICD-10-CM | POA: Diagnosis not present

## 2021-01-25 DIAGNOSIS — Z79899 Other long term (current) drug therapy: Secondary | ICD-10-CM | POA: Diagnosis not present

## 2021-02-01 DIAGNOSIS — F329 Major depressive disorder, single episode, unspecified: Secondary | ICD-10-CM | POA: Diagnosis not present

## 2021-02-01 DIAGNOSIS — F419 Anxiety disorder, unspecified: Secondary | ICD-10-CM | POA: Diagnosis not present

## 2021-02-07 DIAGNOSIS — U071 COVID-19: Secondary | ICD-10-CM | POA: Diagnosis not present

## 2021-02-07 DIAGNOSIS — Z20828 Contact with and (suspected) exposure to other viral communicable diseases: Secondary | ICD-10-CM | POA: Diagnosis not present

## 2021-02-09 DIAGNOSIS — E038 Other specified hypothyroidism: Secondary | ICD-10-CM | POA: Diagnosis not present

## 2021-02-09 DIAGNOSIS — D518 Other vitamin B12 deficiency anemias: Secondary | ICD-10-CM | POA: Diagnosis not present

## 2021-02-09 DIAGNOSIS — E559 Vitamin D deficiency, unspecified: Secondary | ICD-10-CM | POA: Diagnosis not present

## 2021-02-09 DIAGNOSIS — E119 Type 2 diabetes mellitus without complications: Secondary | ICD-10-CM | POA: Diagnosis not present

## 2021-02-22 DIAGNOSIS — F419 Anxiety disorder, unspecified: Secondary | ICD-10-CM | POA: Diagnosis not present

## 2021-02-22 DIAGNOSIS — F329 Major depressive disorder, single episode, unspecified: Secondary | ICD-10-CM | POA: Diagnosis not present

## 2021-02-23 DIAGNOSIS — M79675 Pain in left toe(s): Secondary | ICD-10-CM | POA: Diagnosis not present

## 2021-02-23 DIAGNOSIS — L03032 Cellulitis of left toe: Secondary | ICD-10-CM | POA: Diagnosis not present

## 2021-02-28 DIAGNOSIS — Z79899 Other long term (current) drug therapy: Secondary | ICD-10-CM | POA: Diagnosis not present

## 2021-02-28 DIAGNOSIS — E559 Vitamin D deficiency, unspecified: Secondary | ICD-10-CM | POA: Diagnosis not present

## 2021-03-15 DIAGNOSIS — E559 Vitamin D deficiency, unspecified: Secondary | ICD-10-CM | POA: Diagnosis not present

## 2021-03-15 DIAGNOSIS — E119 Type 2 diabetes mellitus without complications: Secondary | ICD-10-CM | POA: Diagnosis not present

## 2021-03-15 DIAGNOSIS — D518 Other vitamin B12 deficiency anemias: Secondary | ICD-10-CM | POA: Diagnosis not present

## 2021-03-15 DIAGNOSIS — F419 Anxiety disorder, unspecified: Secondary | ICD-10-CM | POA: Diagnosis not present

## 2021-03-15 DIAGNOSIS — F329 Major depressive disorder, single episode, unspecified: Secondary | ICD-10-CM | POA: Diagnosis not present

## 2021-03-15 DIAGNOSIS — E038 Other specified hypothyroidism: Secondary | ICD-10-CM | POA: Diagnosis not present

## 2021-03-16 DIAGNOSIS — F4310 Post-traumatic stress disorder, unspecified: Secondary | ICD-10-CM | POA: Diagnosis not present

## 2021-03-16 DIAGNOSIS — F5101 Primary insomnia: Secondary | ICD-10-CM | POA: Diagnosis not present

## 2021-03-16 DIAGNOSIS — F419 Anxiety disorder, unspecified: Secondary | ICD-10-CM | POA: Diagnosis not present

## 2021-03-16 DIAGNOSIS — F329 Major depressive disorder, single episode, unspecified: Secondary | ICD-10-CM | POA: Diagnosis not present

## 2021-03-22 DIAGNOSIS — R413 Other amnesia: Secondary | ICD-10-CM | POA: Diagnosis not present

## 2021-03-22 DIAGNOSIS — K219 Gastro-esophageal reflux disease without esophagitis: Secondary | ICD-10-CM | POA: Diagnosis not present

## 2021-03-22 DIAGNOSIS — F419 Anxiety disorder, unspecified: Secondary | ICD-10-CM | POA: Diagnosis not present

## 2021-03-22 DIAGNOSIS — E039 Hypothyroidism, unspecified: Secondary | ICD-10-CM | POA: Diagnosis not present

## 2021-03-22 DIAGNOSIS — E119 Type 2 diabetes mellitus without complications: Secondary | ICD-10-CM | POA: Diagnosis not present

## 2021-03-22 DIAGNOSIS — F32A Depression, unspecified: Secondary | ICD-10-CM | POA: Diagnosis not present

## 2021-03-22 DIAGNOSIS — R4689 Other symptoms and signs involving appearance and behavior: Secondary | ICD-10-CM | POA: Diagnosis not present

## 2021-03-25 DIAGNOSIS — R4689 Other symptoms and signs involving appearance and behavior: Secondary | ICD-10-CM | POA: Diagnosis not present

## 2021-03-30 DIAGNOSIS — M79675 Pain in left toe(s): Secondary | ICD-10-CM | POA: Diagnosis not present

## 2021-03-30 DIAGNOSIS — L03032 Cellulitis of left toe: Secondary | ICD-10-CM | POA: Diagnosis not present

## 2021-04-12 DIAGNOSIS — F329 Major depressive disorder, single episode, unspecified: Secondary | ICD-10-CM | POA: Diagnosis not present

## 2021-04-12 DIAGNOSIS — F419 Anxiety disorder, unspecified: Secondary | ICD-10-CM | POA: Diagnosis not present

## 2021-04-17 DIAGNOSIS — E039 Hypothyroidism, unspecified: Secondary | ICD-10-CM | POA: Diagnosis not present

## 2021-04-17 DIAGNOSIS — E119 Type 2 diabetes mellitus without complications: Secondary | ICD-10-CM | POA: Diagnosis not present

## 2021-04-17 DIAGNOSIS — E559 Vitamin D deficiency, unspecified: Secondary | ICD-10-CM | POA: Diagnosis not present

## 2021-04-17 DIAGNOSIS — D518 Other vitamin B12 deficiency anemias: Secondary | ICD-10-CM | POA: Diagnosis not present

## 2021-04-17 DIAGNOSIS — E038 Other specified hypothyroidism: Secondary | ICD-10-CM | POA: Diagnosis not present

## 2021-04-19 DIAGNOSIS — B351 Tinea unguium: Secondary | ICD-10-CM | POA: Diagnosis not present

## 2021-04-19 DIAGNOSIS — F32A Depression, unspecified: Secondary | ICD-10-CM | POA: Diagnosis not present

## 2021-04-19 DIAGNOSIS — R413 Other amnesia: Secondary | ICD-10-CM | POA: Diagnosis not present

## 2021-04-19 DIAGNOSIS — E119 Type 2 diabetes mellitus without complications: Secondary | ICD-10-CM | POA: Diagnosis not present

## 2021-04-19 DIAGNOSIS — K219 Gastro-esophageal reflux disease without esophagitis: Secondary | ICD-10-CM | POA: Diagnosis not present

## 2021-04-19 DIAGNOSIS — B3789 Other sites of candidiasis: Secondary | ICD-10-CM | POA: Diagnosis not present

## 2021-04-20 DIAGNOSIS — F419 Anxiety disorder, unspecified: Secondary | ICD-10-CM | POA: Diagnosis not present

## 2021-04-20 DIAGNOSIS — F5101 Primary insomnia: Secondary | ICD-10-CM | POA: Diagnosis not present

## 2021-04-20 DIAGNOSIS — F329 Major depressive disorder, single episode, unspecified: Secondary | ICD-10-CM | POA: Diagnosis not present

## 2021-04-20 DIAGNOSIS — F4310 Post-traumatic stress disorder, unspecified: Secondary | ICD-10-CM | POA: Diagnosis not present

## 2021-04-25 ENCOUNTER — Emergency Department (HOSPITAL_COMMUNITY)
Admission: EM | Admit: 2021-04-25 | Discharge: 2021-04-25 | Disposition: A | Discharge status: Home / Self Care | Payer: Medicare PPO | Attending: Emergency Medicine | Admitting: Emergency Medicine

## 2021-04-25 ENCOUNTER — Emergency Department (HOSPITAL_COMMUNITY): Payer: Medicare PPO

## 2021-04-25 ENCOUNTER — Encounter (HOSPITAL_COMMUNITY): Payer: Self-pay | Admitting: Emergency Medicine

## 2021-04-25 ENCOUNTER — Other Ambulatory Visit: Payer: Self-pay

## 2021-04-25 DIAGNOSIS — Z8542 Personal history of malignant neoplasm of other parts of uterus: Secondary | ICD-10-CM | POA: Insufficient documentation

## 2021-04-25 DIAGNOSIS — Z8543 Personal history of malignant neoplasm of ovary: Secondary | ICD-10-CM | POA: Diagnosis not present

## 2021-04-25 DIAGNOSIS — Z8544 Personal history of malignant neoplasm of other female genital organs: Secondary | ICD-10-CM | POA: Diagnosis not present

## 2021-04-25 DIAGNOSIS — Z87891 Personal history of nicotine dependence: Secondary | ICD-10-CM | POA: Diagnosis not present

## 2021-04-25 DIAGNOSIS — E039 Hypothyroidism, unspecified: Secondary | ICD-10-CM | POA: Insufficient documentation

## 2021-04-25 DIAGNOSIS — R079 Chest pain, unspecified: Secondary | ICD-10-CM

## 2021-04-25 DIAGNOSIS — Z79899 Other long term (current) drug therapy: Secondary | ICD-10-CM | POA: Diagnosis not present

## 2021-04-25 DIAGNOSIS — Z794 Long term (current) use of insulin: Secondary | ICD-10-CM | POA: Diagnosis not present

## 2021-04-25 DIAGNOSIS — R0789 Other chest pain: Secondary | ICD-10-CM | POA: Diagnosis not present

## 2021-04-25 DIAGNOSIS — J449 Chronic obstructive pulmonary disease, unspecified: Secondary | ICD-10-CM | POA: Insufficient documentation

## 2021-04-25 DIAGNOSIS — Z96651 Presence of right artificial knee joint: Secondary | ICD-10-CM | POA: Insufficient documentation

## 2021-04-25 DIAGNOSIS — E119 Type 2 diabetes mellitus without complications: Secondary | ICD-10-CM | POA: Insufficient documentation

## 2021-04-25 DIAGNOSIS — Z743 Need for continuous supervision: Secondary | ICD-10-CM | POA: Diagnosis not present

## 2021-04-25 DIAGNOSIS — Z7982 Long term (current) use of aspirin: Secondary | ICD-10-CM | POA: Insufficient documentation

## 2021-04-25 DIAGNOSIS — I499 Cardiac arrhythmia, unspecified: Secondary | ICD-10-CM | POA: Diagnosis not present

## 2021-04-25 DIAGNOSIS — R6889 Other general symptoms and signs: Secondary | ICD-10-CM | POA: Diagnosis not present

## 2021-04-25 LAB — CBG MONITORING, ED: Glucose-Capillary: 107 mg/dL — ABNORMAL HIGH (ref 70–99)

## 2021-04-25 LAB — BASIC METABOLIC PANEL
Anion gap: 8 (ref 5–15)
BUN: 19 mg/dL (ref 8–23)
CO2: 25 mmol/L (ref 22–32)
Calcium: 8.7 mg/dL — ABNORMAL LOW (ref 8.9–10.3)
Chloride: 106 mmol/L (ref 98–111)
Creatinine, Ser: 0.7 mg/dL (ref 0.44–1.00)
GFR, Estimated: >60 mL/min (ref >60)
Glucose, Bld: 102 mg/dL — ABNORMAL HIGH (ref 70–99)
Potassium: 3.7 mmol/L (ref 3.5–5.1)
Sodium: 139 mmol/L (ref 135–145)

## 2021-04-25 LAB — CBC
HCT: 33.5 % — ABNORMAL LOW (ref 36.0–46.0)
Hemoglobin: 10.1 g/dL — ABNORMAL LOW (ref 12.0–15.0)
MCH: 26.2 pg (ref 26.0–34.0)
MCHC: 30.1 g/dL (ref 30.0–36.0)
MCV: 87 fL (ref 80.0–100.0)
Platelets: 186 10*3/uL (ref 150–400)
RBC: 3.85 MIL/uL — ABNORMAL LOW (ref 3.87–5.11)
RDW: 14.7 % (ref 11.5–15.5)
WBC: 3.9 10*3/uL — ABNORMAL LOW (ref 4.0–10.5)
nRBC: 0 % (ref 0.0–0.2)

## 2021-04-25 LAB — TROPONIN I (HIGH SENSITIVITY)
Troponin I (High Sensitivity): 4 ng/L (ref <18)
Troponin I (High Sensitivity): 4 ng/L (ref <18)

## 2021-04-25 MED ORDER — ASPIRIN 81 MG PO CHEW
324.0000 mg | CHEWABLE_TABLET | Freq: Once | ORAL | Status: DC
  Filled 2021-04-25: qty 4

## 2021-04-25 NOTE — Discharge Instructions (Addendum)
Follow up with your Physician for recheck.  Return if any problems.  

## 2021-04-25 NOTE — ED Provider Notes (Addendum)
[facility] EMERGENCY DEPARTMENT Provider Note   CSN: [id number] Arrival date & time: 04/25/21  1203     History Chief Complaint  Patient presents with  . Chest Pain    [name] is a 70 y.o. female.  Pt reports her glucose dropped low last pm.  Pt reports she woke from sleep with discomfort in the left side of her chest.  Pt reports pain resolved with 2 nitro tablets.  Pt reports she has not felt well today.  Pt lives in an assited living facility with her husband  The history is provided by the patient. No language interpreter was used.  Chest Pain Pain location:  L chest Pain quality: aching   Pain radiates to:  Does not radiate Pain severity:  Moderate Onset quality:  Gradual Timing:  Constant Progression:  Worsening Chronicity:  New Relieved by:  Nothing Worsened by:  Nothing Ineffective treatments:  None tried Risk factors: diabetes mellitus        Past Medical History:  Diagnosis Date  . Anemia    as a teenager  . Anxiety   . Arthritis    knees, rt hand,hips  . Cancer ([place])    vaginal,uterine,ovariam  . COPD (chronic obstructive pulmonary disease) ([place])   . Depression   . Diabetes mellitus without complication ([place])   . Hypothyroidism   . Neuromuscular disorder ([place])    hands and feet    Patient Active Problem List   Diagnosis Date Noted  . Primary localized osteoarthritis of right knee 09/11/2019    Past Surgical History:  Procedure Laterality Date  . ABDOMINAL HYSTERECTOMY    . BREAST SURGERY Right 1988   lumpectomy  . EYE SURGERY Bilateral   . FRACTURE SURGERY     Rt ankle,lt knee cap,lt shoulder,rt wrist,rt shoulder,  . TONSILLECTOMY     at age 25  . 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   Procedure: TOTAL KNEE ARTHROPLASTY;  Surgeon: Earlie Server, MD;  Location: [facility];  Service: Orthopedics;  Laterality: Right;     OB History   No obstetric history on file.     History reviewed. No pertinent family  history.  Social History   Tobacco Use  . Smoking status: Former Smoker    Packs/day: 0.50    Years: 15.00    Pack years: 7.50    Types: Cigarettes    Quit date: 2013    Years since quitting: 9.3  . Smokeless tobacco: Never Used  Vaping Use  . Vaping Use: Never used  Substance Use Topics  . Alcohol use: Never  . Drug use: Never    Home Medications Prior to Admission medications   Medication Sig Start Date End Date Taking? Authorizing Provider  acetaminophen (TYLENOL) 650 MG CR tablet Take 650 mg by mouth every 8 (eight) hours as needed for pain.   Yes [provider]  acyclovir (ZOVIRAX) 400 MG tablet Take 400 mg by mouth every 12 (twelve) hours. Breakfast & bedtime   Yes [provider]  cholecalciferol (VITAMIN D3) 25 MCG (1000 UNIT) tablet Take 1,000 Units by mouth daily.   Yes [provider]  donepezil (ARICEPT) 10 MG tablet Take 10 mg by mouth daily. 04/25/21  Yes [provider]  escitalopram (LEXAPRO) 20 MG tablet Take 1 tablet by mouth daily. 04/25/21  Yes [provider]  esomeprazole (NEXIUM) 40 MG capsule Take 40 mg by mouth daily. 04/25/21  Yes [provider]  Insulin Glargine (LANTUS SOLOSTAR) 100 UNIT/ML  Solostar Pen Inject 40-50 Units into the skin See admin instructions. Inject 50 units subcutaneously with breakfast & 40 units subcutaneously after supper   Yes [provider]  levothyroxine (SYNTHROID) 125 MCG tablet Take 125 mcg by mouth daily. 04/25/21  Yes [provider]  prazosin (MINIPRESS) 2 MG capsule Take 2 mg by mouth at bedtime. 04/25/21  Yes [provider]  terbinafine (LAMISIL) 250 MG tablet Take 250 mg by mouth daily. 04/19/21  Yes [provider]  traZODone (DESYREL) 50 MG tablet Take 100 mg by mouth at bedtime as needed. 04/25/21  Yes [provider]  Vitamin D, Ergocalciferol, (DRISDOL) 1.25 MG (50000 UNIT) CAPS capsule Take 50,000 Units by mouth every 7 (seven)  days.   Yes [provider]  aspirin 81 MG chewable tablet Chew 1 tablet (81 mg total) by mouth 2 (two) times daily. Patient not taking: No sig reported 09/14/19   Chadwell, Vonna Kotyk, PA-C  oxyCODONE (OXY IR/ROXICODONE) 5 MG immediate release tablet Take one tab po q4-6hrs prn pain, may need 1-2 first couple weeks Patient not taking: No sig reported 09/11/19   Chadwell, Vonna Kotyk, PA-C  oxyCODONE (OXY IR/ROXICODONE) 5 MG immediate release tablet Take 1-2 tablets (5-10 mg total) by mouth every 4 (four) hours as needed for moderate pain (pain score 4-6). Patient not taking: No sig reported 09/14/19   Chadwell, Vonna Kotyk, PA-C    Allergies    Ambien [zolpidem tartrate], Coconut flavor, Codeine, Onion, Other, and Sulfa antibiotics  Review of Systems   Review of Systems  Cardiovascular: Positive for chest pain.  All other systems reviewed and are negative.   Physical Exam Updated Vital Signs BP (!) 140/50   Pulse (!) 48   Temp 98.2 F (36.8 C) (Oral)   Resp 16   Ht 5\' 7"  (1.702 m)   Wt 100.8 kg   SpO2 97%   BMI 34.80 kg/m   Physical Exam Vitals and nursing note reviewed.  Constitutional:      Appearance: She is well-developed.  HENT:     Head: Normocephalic.  Cardiovascular:     Rate and Rhythm: Normal rate and regular rhythm.     Heart sounds: Normal heart sounds.  Pulmonary:     Effort: Pulmonary effort is normal.     Breath sounds: Normal breath sounds.  Abdominal:     General: There is no distension.     Palpations: Abdomen is soft.  Musculoskeletal:        General: Normal range of motion.     Cervical back: Normal range of motion.  Skin:    General: Skin is warm.  Neurological:     General: No focal deficit present.     Mental Status: She is alert and oriented to person, place, and time.  Psychiatric:        Mood and Affect: Mood normal.     ED Results / Procedures / Treatments   Labs (all labs ordered are listed, but only abnormal results are  displayed) Labs Reviewed  BASIC METABOLIC PANEL - Abnormal; Notable for the following components:      Result Value   Glucose, Bld 102 (*)    Calcium 8.7 (*)    All other components within normal limits  CBC - Abnormal; Notable for the following components:   WBC 3.9 (*)    RBC 3.85 (*)    Hemoglobin 10.1 (*)    HCT 33.5 (*)    All other components within normal limits  CBG MONITORING,  ED - Abnormal; Notable for the following components:   Glucose-Capillary 107 (*)    All other components within normal limits  TROPONIN I (HIGH SENSITIVITY)  TROPONIN I (HIGH SENSITIVITY)    EKG None  Radiology DG Chest Portable 1 View  Result Date: 04/25/2021 CLINICAL DATA:  Chest pain EXAM: PORTABLE CHEST 1 VIEW COMPARISON:  June 21, 2020 FINDINGS: No edema or airspace opacity. Heart size and pulmonary vascularity are normal. No adenopathy. There is aortic atherosclerosis. There is degenerative change in the right shoulder. IMPRESSION: No edema or airspace opacity. Heart size within normal limits. Aortic Atherosclerosis (ICD10-I70.0). Electronically Signed   By: Lowella Grip III M.D.   On: 04/25/2021 13:44    Procedures Procedures   Medications Ordered in ED Medications - No data to display  ED Course  I have reviewed the triage vital signs and the nursing notes.  Pertinent labs & imaging results that were available during my care of the patient were reviewed by me and considered in my medical decision making (see chart for details).    MDM Rules/Calculators/A&P                          MDM:  EKG no acute abnormality  Chest xray no acute.  Troponin negative x 2.  Pt has not had any further episodes.  Food and liquids ordered.  Pt advised to follow up with her MD for recheck. Final Clinical Impression(s) / ED Diagnoses Final diagnoses:  Nonspecific chest pain    Rx / DC Orders ED Discharge Orders    None       Sidney Ace 04/25/21 1815 An After Visit Summary was  printed and given to the patient.    Fransico Meadow, Hershal Coria 04/25/21 Ysidro Evert, MD 04/26/21 1016

## 2021-04-25 NOTE — ED Notes (Signed)
Called [phone] to [facility], husband is to come pick up pt.

## 2021-04-25 NOTE — ED Triage Notes (Signed)
Pt c/o cp that started this morning. enroute ems gave aspirin and 2 nitro.

## 2021-04-25 NOTE — ED Notes (Signed)
Pt given crackers, water and diet ginger-ale.

## 2021-05-09 DIAGNOSIS — M79675 Pain in left toe(s): Secondary | ICD-10-CM | POA: Diagnosis not present

## 2021-05-09 DIAGNOSIS — E119 Type 2 diabetes mellitus without complications: Secondary | ICD-10-CM | POA: Diagnosis not present

## 2021-05-09 DIAGNOSIS — E039 Hypothyroidism, unspecified: Secondary | ICD-10-CM | POA: Diagnosis not present

## 2021-05-09 DIAGNOSIS — D518 Other vitamin B12 deficiency anemias: Secondary | ICD-10-CM | POA: Diagnosis not present

## 2021-05-09 DIAGNOSIS — E559 Vitamin D deficiency, unspecified: Secondary | ICD-10-CM | POA: Diagnosis not present

## 2021-05-09 DIAGNOSIS — E038 Other specified hypothyroidism: Secondary | ICD-10-CM | POA: Diagnosis not present

## 2021-05-09 DIAGNOSIS — L03032 Cellulitis of left toe: Secondary | ICD-10-CM | POA: Diagnosis not present

## 2021-05-16 DIAGNOSIS — F419 Anxiety disorder, unspecified: Secondary | ICD-10-CM | POA: Diagnosis not present

## 2021-05-16 DIAGNOSIS — F329 Major depressive disorder, single episode, unspecified: Secondary | ICD-10-CM | POA: Diagnosis not present

## 2021-05-17 DIAGNOSIS — F419 Anxiety disorder, unspecified: Secondary | ICD-10-CM | POA: Diagnosis not present

## 2021-05-17 DIAGNOSIS — K219 Gastro-esophageal reflux disease without esophagitis: Secondary | ICD-10-CM | POA: Diagnosis not present

## 2021-05-17 DIAGNOSIS — N3281 Overactive bladder: Secondary | ICD-10-CM | POA: Diagnosis not present

## 2021-05-17 DIAGNOSIS — E119 Type 2 diabetes mellitus without complications: Secondary | ICD-10-CM | POA: Diagnosis not present

## 2021-05-17 DIAGNOSIS — E039 Hypothyroidism, unspecified: Secondary | ICD-10-CM | POA: Diagnosis not present

## 2021-05-17 DIAGNOSIS — R413 Other amnesia: Secondary | ICD-10-CM | POA: Diagnosis not present

## 2021-05-17 DIAGNOSIS — F32A Depression, unspecified: Secondary | ICD-10-CM | POA: Diagnosis not present

## 2021-06-01 DIAGNOSIS — E038 Other specified hypothyroidism: Secondary | ICD-10-CM | POA: Diagnosis not present

## 2021-06-01 DIAGNOSIS — E119 Type 2 diabetes mellitus without complications: Secondary | ICD-10-CM | POA: Diagnosis not present

## 2021-06-01 DIAGNOSIS — E559 Vitamin D deficiency, unspecified: Secondary | ICD-10-CM | POA: Diagnosis not present

## 2021-06-01 DIAGNOSIS — E039 Hypothyroidism, unspecified: Secondary | ICD-10-CM | POA: Diagnosis not present

## 2021-06-01 DIAGNOSIS — D518 Other vitamin B12 deficiency anemias: Secondary | ICD-10-CM | POA: Diagnosis not present

## 2021-06-12 DIAGNOSIS — F419 Anxiety disorder, unspecified: Secondary | ICD-10-CM | POA: Diagnosis not present

## 2021-06-12 DIAGNOSIS — F329 Major depressive disorder, single episode, unspecified: Secondary | ICD-10-CM | POA: Diagnosis not present

## 2021-06-14 DIAGNOSIS — G4700 Insomnia, unspecified: Secondary | ICD-10-CM | POA: Diagnosis not present

## 2021-06-14 DIAGNOSIS — E039 Hypothyroidism, unspecified: Secondary | ICD-10-CM | POA: Diagnosis not present

## 2021-06-14 DIAGNOSIS — K219 Gastro-esophageal reflux disease without esophagitis: Secondary | ICD-10-CM | POA: Diagnosis not present

## 2021-06-14 DIAGNOSIS — B351 Tinea unguium: Secondary | ICD-10-CM | POA: Diagnosis not present

## 2021-06-14 DIAGNOSIS — R413 Other amnesia: Secondary | ICD-10-CM | POA: Diagnosis not present

## 2021-06-14 DIAGNOSIS — E119 Type 2 diabetes mellitus without complications: Secondary | ICD-10-CM | POA: Diagnosis not present

## 2021-06-14 DIAGNOSIS — R32 Unspecified urinary incontinence: Secondary | ICD-10-CM | POA: Diagnosis not present

## 2021-06-22 DIAGNOSIS — F4310 Post-traumatic stress disorder, unspecified: Secondary | ICD-10-CM | POA: Diagnosis not present

## 2021-06-22 DIAGNOSIS — F329 Major depressive disorder, single episode, unspecified: Secondary | ICD-10-CM | POA: Diagnosis not present

## 2021-06-22 DIAGNOSIS — E1165 Type 2 diabetes mellitus with hyperglycemia: Secondary | ICD-10-CM | POA: Diagnosis not present

## 2021-06-22 DIAGNOSIS — F5101 Primary insomnia: Secondary | ICD-10-CM | POA: Diagnosis not present

## 2021-06-22 DIAGNOSIS — F419 Anxiety disorder, unspecified: Secondary | ICD-10-CM | POA: Diagnosis not present

## 2021-06-25 DIAGNOSIS — M25562 Pain in left knee: Secondary | ICD-10-CM | POA: Diagnosis not present

## 2021-06-26 DIAGNOSIS — M25562 Pain in left knee: Secondary | ICD-10-CM | POA: Diagnosis not present

## 2021-06-28 DIAGNOSIS — F419 Anxiety disorder, unspecified: Secondary | ICD-10-CM | POA: Diagnosis not present

## 2021-06-28 DIAGNOSIS — F329 Major depressive disorder, single episode, unspecified: Secondary | ICD-10-CM | POA: Diagnosis not present

## 2021-07-06 DIAGNOSIS — F5101 Primary insomnia: Secondary | ICD-10-CM | POA: Diagnosis not present

## 2021-07-06 DIAGNOSIS — F329 Major depressive disorder, single episode, unspecified: Secondary | ICD-10-CM | POA: Diagnosis not present

## 2021-07-06 DIAGNOSIS — F419 Anxiety disorder, unspecified: Secondary | ICD-10-CM | POA: Diagnosis not present

## 2021-07-06 DIAGNOSIS — F4310 Post-traumatic stress disorder, unspecified: Secondary | ICD-10-CM | POA: Diagnosis not present

## 2021-07-13 DIAGNOSIS — R4689 Other symptoms and signs involving appearance and behavior: Secondary | ICD-10-CM | POA: Diagnosis not present

## 2021-07-13 DIAGNOSIS — E039 Hypothyroidism, unspecified: Secondary | ICD-10-CM | POA: Diagnosis not present

## 2021-07-13 DIAGNOSIS — F419 Anxiety disorder, unspecified: Secondary | ICD-10-CM | POA: Diagnosis not present

## 2021-07-13 DIAGNOSIS — F32A Depression, unspecified: Secondary | ICD-10-CM | POA: Diagnosis not present

## 2021-07-13 DIAGNOSIS — R413 Other amnesia: Secondary | ICD-10-CM | POA: Diagnosis not present

## 2021-07-13 DIAGNOSIS — K219 Gastro-esophageal reflux disease without esophagitis: Secondary | ICD-10-CM | POA: Diagnosis not present

## 2021-07-13 DIAGNOSIS — G4700 Insomnia, unspecified: Secondary | ICD-10-CM | POA: Diagnosis not present

## 2021-07-13 DIAGNOSIS — N3281 Overactive bladder: Secondary | ICD-10-CM | POA: Diagnosis not present

## 2021-07-13 DIAGNOSIS — E119 Type 2 diabetes mellitus without complications: Secondary | ICD-10-CM | POA: Diagnosis not present

## 2021-07-17 DIAGNOSIS — Z79899 Other long term (current) drug therapy: Secondary | ICD-10-CM | POA: Diagnosis not present

## 2021-07-17 DIAGNOSIS — E038 Other specified hypothyroidism: Secondary | ICD-10-CM | POA: Diagnosis not present

## 2021-07-17 DIAGNOSIS — E7849 Other hyperlipidemia: Secondary | ICD-10-CM | POA: Diagnosis not present

## 2021-07-17 DIAGNOSIS — D518 Other vitamin B12 deficiency anemias: Secondary | ICD-10-CM | POA: Diagnosis not present

## 2021-07-17 DIAGNOSIS — E559 Vitamin D deficiency, unspecified: Secondary | ICD-10-CM | POA: Diagnosis not present

## 2021-07-17 DIAGNOSIS — E119 Type 2 diabetes mellitus without complications: Secondary | ICD-10-CM | POA: Diagnosis not present

## 2021-07-18 DIAGNOSIS — M79676 Pain in unspecified toe(s): Secondary | ICD-10-CM | POA: Diagnosis not present

## 2021-07-18 DIAGNOSIS — E1142 Type 2 diabetes mellitus with diabetic polyneuropathy: Secondary | ICD-10-CM | POA: Diagnosis not present

## 2021-07-18 DIAGNOSIS — L84 Corns and callosities: Secondary | ICD-10-CM | POA: Diagnosis not present

## 2021-07-18 DIAGNOSIS — B351 Tinea unguium: Secondary | ICD-10-CM | POA: Diagnosis not present

## 2021-07-20 DIAGNOSIS — F4310 Post-traumatic stress disorder, unspecified: Secondary | ICD-10-CM | POA: Diagnosis not present

## 2021-07-20 DIAGNOSIS — F419 Anxiety disorder, unspecified: Secondary | ICD-10-CM | POA: Diagnosis not present

## 2021-07-20 DIAGNOSIS — F329 Major depressive disorder, single episode, unspecified: Secondary | ICD-10-CM | POA: Diagnosis not present

## 2021-07-20 DIAGNOSIS — F5101 Primary insomnia: Secondary | ICD-10-CM | POA: Diagnosis not present

## 2021-07-21 DIAGNOSIS — F419 Anxiety disorder, unspecified: Secondary | ICD-10-CM | POA: Diagnosis not present

## 2021-07-21 DIAGNOSIS — F329 Major depressive disorder, single episode, unspecified: Secondary | ICD-10-CM | POA: Diagnosis not present

## 2021-07-25 DIAGNOSIS — E538 Deficiency of other specified B group vitamins: Secondary | ICD-10-CM | POA: Diagnosis not present

## 2021-07-25 DIAGNOSIS — E119 Type 2 diabetes mellitus without complications: Secondary | ICD-10-CM | POA: Diagnosis not present

## 2021-07-25 DIAGNOSIS — R011 Cardiac murmur, unspecified: Secondary | ICD-10-CM | POA: Diagnosis not present

## 2021-07-25 DIAGNOSIS — E785 Hyperlipidemia, unspecified: Secondary | ICD-10-CM | POA: Diagnosis not present

## 2021-07-25 DIAGNOSIS — Z79899 Other long term (current) drug therapy: Secondary | ICD-10-CM | POA: Diagnosis not present

## 2021-08-01 ENCOUNTER — Other Ambulatory Visit (HOSPITAL_COMMUNITY): Payer: Self-pay | Admitting: Nurse Practitioner

## 2021-08-01 DIAGNOSIS — N63 Unspecified lump in unspecified breast: Secondary | ICD-10-CM

## 2021-08-03 DIAGNOSIS — E559 Vitamin D deficiency, unspecified: Secondary | ICD-10-CM | POA: Diagnosis not present

## 2021-08-03 DIAGNOSIS — E039 Hypothyroidism, unspecified: Secondary | ICD-10-CM | POA: Diagnosis not present

## 2021-08-03 DIAGNOSIS — E785 Hyperlipidemia, unspecified: Secondary | ICD-10-CM | POA: Diagnosis not present

## 2021-08-03 DIAGNOSIS — E038 Other specified hypothyroidism: Secondary | ICD-10-CM | POA: Diagnosis not present

## 2021-08-03 DIAGNOSIS — D518 Other vitamin B12 deficiency anemias: Secondary | ICD-10-CM | POA: Diagnosis not present

## 2021-08-03 DIAGNOSIS — E119 Type 2 diabetes mellitus without complications: Secondary | ICD-10-CM | POA: Diagnosis not present

## 2021-08-08 DIAGNOSIS — R630 Anorexia: Secondary | ICD-10-CM | POA: Diagnosis not present

## 2021-08-08 DIAGNOSIS — R112 Nausea with vomiting, unspecified: Secondary | ICD-10-CM | POA: Diagnosis not present

## 2021-08-08 DIAGNOSIS — K529 Noninfective gastroenteritis and colitis, unspecified: Secondary | ICD-10-CM | POA: Diagnosis not present

## 2021-08-08 DIAGNOSIS — R197 Diarrhea, unspecified: Secondary | ICD-10-CM | POA: Diagnosis not present

## 2021-08-10 DIAGNOSIS — E538 Deficiency of other specified B group vitamins: Secondary | ICD-10-CM | POA: Diagnosis not present

## 2021-08-10 DIAGNOSIS — E039 Hypothyroidism, unspecified: Secondary | ICD-10-CM | POA: Diagnosis not present

## 2021-08-10 DIAGNOSIS — E785 Hyperlipidemia, unspecified: Secondary | ICD-10-CM | POA: Diagnosis not present

## 2021-08-10 DIAGNOSIS — K219 Gastro-esophageal reflux disease without esophagitis: Secondary | ICD-10-CM | POA: Diagnosis not present

## 2021-08-10 DIAGNOSIS — R197 Diarrhea, unspecified: Secondary | ICD-10-CM | POA: Diagnosis not present

## 2021-08-10 DIAGNOSIS — K529 Noninfective gastroenteritis and colitis, unspecified: Secondary | ICD-10-CM | POA: Diagnosis not present

## 2021-08-16 DIAGNOSIS — F419 Anxiety disorder, unspecified: Secondary | ICD-10-CM | POA: Diagnosis not present

## 2021-08-16 DIAGNOSIS — F329 Major depressive disorder, single episode, unspecified: Secondary | ICD-10-CM | POA: Diagnosis not present

## 2021-08-17 DIAGNOSIS — F329 Major depressive disorder, single episode, unspecified: Secondary | ICD-10-CM | POA: Diagnosis not present

## 2021-08-17 DIAGNOSIS — F419 Anxiety disorder, unspecified: Secondary | ICD-10-CM | POA: Diagnosis not present

## 2021-08-17 DIAGNOSIS — F4310 Post-traumatic stress disorder, unspecified: Secondary | ICD-10-CM | POA: Diagnosis not present

## 2021-08-17 DIAGNOSIS — F5101 Primary insomnia: Secondary | ICD-10-CM | POA: Diagnosis not present

## 2021-08-29 ENCOUNTER — Ambulatory Visit (HOSPITAL_COMMUNITY)
Admission: RE | Admit: 2021-08-29 | Discharge: 2021-08-29 | Disposition: A | Discharge status: Home / Self Care | Payer: Medicare Other | Source: Ambulatory Visit | Attending: Nurse Practitioner | Admitting: Nurse Practitioner

## 2021-08-29 ENCOUNTER — Other Ambulatory Visit: Payer: Self-pay

## 2021-08-29 DIAGNOSIS — N6313 Unspecified lump in the right breast, lower outer quadrant: Secondary | ICD-10-CM | POA: Insufficient documentation

## 2021-08-29 DIAGNOSIS — N63 Unspecified lump in unspecified breast: Secondary | ICD-10-CM

## 2021-08-29 DIAGNOSIS — N6314 Unspecified lump in the right breast, lower inner quadrant: Secondary | ICD-10-CM | POA: Insufficient documentation

## 2021-10-20 ENCOUNTER — Ambulatory Visit: Payer: Medicare Other | Admitting: Urology

## 2021-10-27 ENCOUNTER — Ambulatory Visit: Payer: Medicare Other | Admitting: Urology

## 2021-11-06 ENCOUNTER — Other Ambulatory Visit: Payer: Self-pay

## 2021-11-06 ENCOUNTER — Ambulatory Visit (INDEPENDENT_AMBULATORY_CARE_PROVIDER_SITE_OTHER): Payer: Medicare Other | Admitting: Urology

## 2021-11-06 ENCOUNTER — Encounter: Payer: Self-pay | Admitting: Urology

## 2021-11-06 VITALS — BP 147/68 | HR 60 | Temp 98.1°F

## 2021-11-06 DIAGNOSIS — N3941 Urge incontinence: Secondary | ICD-10-CM | POA: Diagnosis not present

## 2021-11-06 DIAGNOSIS — R350 Frequency of micturition: Secondary | ICD-10-CM | POA: Diagnosis not present

## 2021-11-06 LAB — BLADDER SCAN AMB NON-IMAGING: Scan Result: 49

## 2021-11-06 NOTE — Progress Notes (Signed)
Urological Symptom Review  Patient is experiencing the following symptoms: Frequent urination Hard to postpone urination Get up at night to urinate Leakage of urine   Review of Systems  Gastrointestinal (upper)  : Negative for upper GI symptoms  Gastrointestinal (lower) : Diarrhea  Constitutional : Night Sweats  Skin: Negative for skin symptoms  Eyes: Negative for eye symptoms  Ear/Nose/Throat : Negative for Ear/Nose/Throat symptoms  Hematologic/Lymphatic: Easy bruising  Cardiovascular : Negative for cardiovascular symptoms  Respiratory : Negative for respiratory symptoms  Endocrine: Excessive thirst  Musculoskeletal: Joint pain  Neurological: Negative for neurological symptoms  Psychologic: Anxiety

## 2021-11-06 NOTE — Progress Notes (Signed)
Assessment: 1. Urge incontinence   2. Urine frequency      Plan: Diagnosis and management of overactive bladder and urge incontinence discussed with the patient in detail today.  Options for management including behavioral education, avoidance of dietary irritants, medical therapy, neuromodulation, and chemodenervation discussed. Discontinue oxybutynin Trial of Solifenacin 10 mg daily.  Return to office in 1 month. Return to office in 1 month.  She is a resident at [facility]  Chief Complaint:  Chief Complaint  Patient presents with   Urinary Incontinence     History of Present Illness:  [name] is a 70 y.o. year old female who is seen in consultation from [place], Rosalyn Charters, MD for evaluation of urinary incontinence.  She reports an 31-month history of frequency, urgency, and nocturia.  She voids at least every 2 hours.  She has urge incontinence as well as nighttime incontinence.  She is using multiple pads and pull-ups throughout the day.  She reports voiding with a good stream and feels like she empties her bladder completely.  No dysuria or gross hematuria.  No history of UTIs.  No fecal incontinence.  She is currently on oxybutynin 5 mg daily without improvement.  She has not tried any other medical therapy.   Past Medical History:  Past Medical History:  Diagnosis Date   Anemia    as a teenager   Anxiety    Arthritis    knees, rt hand,hips   Cancer ([facility])    vaginal,uterine,ovariam   COPD (chronic obstructive pulmonary disease) ([facility])    Depression    Diabetes mellitus without complication ([facility])    Hypothyroidism    Neuromuscular disorder ([place])    hands and feet    Past Surgical History:  Past Surgical History:  Procedure Laterality Date   ABDOMINAL HYSTERECTOMY     BREAST SURGERY Right 1988   lumpectomy   EYE SURGERY Bilateral    FRACTURE SURGERY     Rt ankle,lt knee cap,lt shoulder,rt wrist,rt shoulder,   TONSILLECTOMY     at age  85   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   Procedure: TOTAL KNEE ARTHROPLASTY;  Surgeon: Earlie Server, MD;  Location: [facility];  Service: Orthopedics;  Laterality: Right;    Allergies:  Allergies  Allergen Reactions   Ambien [Zolpidem Tartrate] Other (See Comments)    Hallucinations/nightmares (tried to jump out of a window)   Coconut Flavor Swelling    Mouth swells   Codeine Nausea And Vomiting   Onion Nausea Only and Other (See Comments)    Raw onions   Other     Ground Beef (patient avoid due to side effect of diarrhea)   Sulfa Antibiotics Rash and Other (See Comments)    Including topical sulfa  (mouth dries/peels/skin blisters)    Family History:  No family history on file.  Social History:  Social History   Tobacco Use   Smoking status: Former    Packs/day: 0.50    Years: 15.00    Pack years: 7.50    Types: Cigarettes    Quit date: 2013    Years since quitting: 9.8   Smokeless tobacco: Never  Vaping Use   Vaping Use: Never used  Substance Use Topics   Alcohol use: Never   Drug use: Never    Review of symptoms:  Constitutional:  Negative for unexplained weight loss, night sweats, fever, chills ENT:  Negative for nose bleeds, sinus pain, painful swallowing CV:  Negative for  chest pain, shortness of breath, exercise intolerance, palpitations, loss of consciousness Resp:  Negative for cough, wheezing, shortness of breath GI:  Negative for nausea, vomiting, diarrhea, bloody stools GU:  Positives noted in HPI; otherwise negative for gross hematuria, dysuria Neuro:  Negative for seizures, poor balance, limb weakness, slurred speech Psych:  Negative for lack of energy, depression, anxiety Endocrine:  Negative for polydipsia, polyuria, symptoms of hypoglycemia (dizziness, hunger, sweating) Hematologic:  Negative for anemia, purpura, petechia, prolonged or excessive bleeding, use of anticoagulants  Allergic:  Negative for difficulty breathing or choking as a  result of exposure to anything; no shellfish allergy; no allergic response (rash/itch) to materials, foods  Physical exam: BP (!) 147/68   Pulse 60   Temp 98.1 F (36.7 C)  GENERAL APPEARANCE:  Well appearing, well developed, well nourished, NAD HEENT: Atraumatic, Normocephalic, oropharynx clear. NECK: Supple without lymphadenopathy or thyromegaly. LUNGS: Clear to auscultation bilaterally. HEART: Regular Rate and Rhythm without murmurs, gallops, or rubs. ABDOMEN: Soft, non-tender, No Masses. EXTREMITIES: Moves all extremities well.  Without clubbing, cyanosis, or edema. NEUROLOGIC:  Alert and oriented x 3, normal gait, CN II-XII grossly intact.  MENTAL STATUS:  Appropriate. BACK:  Non-tender to palpation.  No CVAT SKIN:  Warm, dry and intact.    Results: U/A dipstick:  trace blood, trace LE   PVR:  49 ml

## 2021-11-06 NOTE — Progress Notes (Signed)
post void residual=49 

## 2021-12-02 ENCOUNTER — Emergency Department (HOSPITAL_COMMUNITY): Payer: Medicare Other

## 2021-12-02 ENCOUNTER — Emergency Department (HOSPITAL_COMMUNITY)
Admission: EM | Admit: 2021-12-02 | Discharge: 2021-12-02 | Disposition: A | Discharge status: Home / Self Care | Payer: Medicare Other | Attending: Emergency Medicine | Admitting: Emergency Medicine

## 2021-12-02 ENCOUNTER — Other Ambulatory Visit: Payer: Self-pay

## 2021-12-02 ENCOUNTER — Encounter (HOSPITAL_COMMUNITY): Payer: Self-pay | Admitting: Emergency Medicine

## 2021-12-02 DIAGNOSIS — Z8544 Personal history of malignant neoplasm of other female genital organs: Secondary | ICD-10-CM | POA: Diagnosis not present

## 2021-12-02 DIAGNOSIS — E039 Hypothyroidism, unspecified: Secondary | ICD-10-CM | POA: Insufficient documentation

## 2021-12-02 DIAGNOSIS — S2002XA Contusion of left breast, initial encounter: Secondary | ICD-10-CM | POA: Diagnosis not present

## 2021-12-02 DIAGNOSIS — Z96651 Presence of right artificial knee joint: Secondary | ICD-10-CM | POA: Diagnosis not present

## 2021-12-02 DIAGNOSIS — S80211A Abrasion, right knee, initial encounter: Secondary | ICD-10-CM | POA: Diagnosis not present

## 2021-12-02 DIAGNOSIS — Z87891 Personal history of nicotine dependence: Secondary | ICD-10-CM | POA: Insufficient documentation

## 2021-12-02 DIAGNOSIS — W01190A Fall on same level from slipping, tripping and stumbling with subsequent striking against furniture, initial encounter: Secondary | ICD-10-CM | POA: Insufficient documentation

## 2021-12-02 DIAGNOSIS — W19XXXA Unspecified fall, initial encounter: Secondary | ICD-10-CM

## 2021-12-02 DIAGNOSIS — Z794 Long term (current) use of insulin: Secondary | ICD-10-CM | POA: Diagnosis not present

## 2021-12-02 DIAGNOSIS — E119 Type 2 diabetes mellitus without complications: Secondary | ICD-10-CM | POA: Insufficient documentation

## 2021-12-02 DIAGNOSIS — S20102A Unspecified superficial injuries of breast, left breast, initial encounter: Secondary | ICD-10-CM | POA: Diagnosis present

## 2021-12-02 DIAGNOSIS — J449 Chronic obstructive pulmonary disease, unspecified: Secondary | ICD-10-CM | POA: Diagnosis not present

## 2021-12-02 DIAGNOSIS — Z7982 Long term (current) use of aspirin: Secondary | ICD-10-CM | POA: Insufficient documentation

## 2021-12-02 DIAGNOSIS — S0990XA Unspecified injury of head, initial encounter: Secondary | ICD-10-CM | POA: Diagnosis present

## 2021-12-02 MED ORDER — ACETAMINOPHEN 500 MG PO TABS
1000.0000 mg | ORAL_TABLET | Freq: Once | ORAL | Status: AC
  Administered 2021-12-02: 1000 mg via ORAL
  Filled 2021-12-02: qty 2

## 2021-12-02 NOTE — ED Triage Notes (Signed)
Pt to the ED [facility] from [place] with complaints of a fall.  Pt is complaining of pain in her right knee and on her right side.  Pt states she did hit her head and she is on aspirin for anticoagulation.

## 2021-12-02 NOTE — ED Provider Notes (Signed)
[facility] EMERGENCY DEPARTMENT Provider Note   CSN: [id number] Arrival date & time: 12/02/21  1557     History Chief Complaint  Patient presents with   [name]    ARNETTE DRIGGS is a 70 y.o. female.  HPI  Patient with medical history including COPD, diabetes, arthritis presents after having a mechanical fall.  Patient states today she was walking and tripped over her husband's walker, causing her to fall onto her right side,  she states that as she was falling her left breast clipped the side of a dresser.  She states that she hit her head on the floor but denies losing conscious, is not on anticoagulant, was unable to get up after the fall.  She states that she is having pain in her left breast, right hip, knee and foot.  She denies headaches, change in vision, paresthesia/weakness in the upper/lower extremities, she denies difficulty breathing, chest pain, shortness of breath, stomach pain, denies  neck or back pain.  She has not had anything for pain at this time.  Denies alleviating or aggravating factors.  Past Medical History:  Diagnosis Date   Anemia    as a teenager   Anxiety    Arthritis    knees, rt hand,hips   Cancer (HCC)    vaginal,uterine,ovariam   COPD (chronic obstructive pulmonary disease) (HCC)    Depression    Diabetes mellitus without complication ([place])    Hypothyroidism    Neuromuscular disorder ([place])    hands and feet    Patient Active Problem List   Diagnosis Date Noted   Urge incontinence 11/06/2021   Primary localized osteoarthritis of right knee 09/11/2019    Past Surgical History:  Procedure Laterality Date   ABDOMINAL HYSTERECTOMY     BREAST SURGERY Right 1988   lumpectomy   EYE SURGERY Bilateral    FRACTURE SURGERY     Rt ankle,lt knee cap,lt shoulder,rt wrist,rt shoulder,   TONSILLECTOMY     at age 13   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   Procedure: TOTAL KNEE ARTHROPLASTY;  Surgeon: Earlie Server, MD;  Location: [facility];   Service: Orthopedics;  Laterality: Right;     OB History   No obstetric history on file.     History reviewed. No pertinent family history.  Social History   Tobacco Use   Smoking status: Former    Packs/day: 0.50    Years: 15.00    Pack years: 7.50    Types: Cigarettes    Quit date: 2013    Years since quitting: 9.9   Smokeless tobacco: Never  Vaping Use   Vaping Use: Never used  Substance Use Topics   Alcohol use: Never   Drug use: Never    Home Medications Prior to Admission medications   Medication Sig Start Date End Date Taking? Authorizing Provider  acyclovir (ZOVIRAX) 400 MG tablet Take 400 mg by mouth every 12 (twelve) hours. Breakfast & bedtime   Yes [provider]  aspirin 81 MG chewable tablet Chew 1 tablet (81 mg total) by mouth 2 (two) times daily. Patient taking differently: Chew 81 mg by mouth daily. 09/14/19  Yes Chadwell, Vonna Kotyk, PA-C  atorvastatin (LIPITOR) 20 MG tablet Take 20 mg by mouth at bedtime. 11/07/21  Yes [provider]  cholecalciferol (VITAMIN D3) 25 MCG (1000 UNIT) tablet Take 1,000 Units by mouth daily.   Yes [provider]  clonazePAM (KLONOPIN) 0.5 MG tablet Take 0.5 mg by mouth at bedtime. 11/07/21  Yes [provider]  donepezil (ARICEPT) 10 MG tablet Take 10 mg by mouth daily. 04/25/21  Yes [provider]  escitalopram (LEXAPRO) 20 MG tablet Take 1 tablet by mouth daily. 04/25/21  Yes [provider]  Insulin Glargine (LANTUS SOLOSTAR) 100 UNIT/ML Solostar Pen Inject 50 Units into the skin 2 (two) times daily. Inject 50 units subcutaneously with breakfast & 40 units subcutaneously after supper   Yes [provider]  levothyroxine (SYNTHROID) 125 MCG tablet Take 125 mcg by mouth daily. 04/25/21  Yes [provider]  loperamide (IMODIUM) 2 MG capsule Take 2 mg by mouth See admin instructions. Take 2 mg PO after each loose stool as needed for diarrhea (max of 3 doses in 24  hours) 09/29/21  Yes [provider]  nystatin cream (MYCOSTATIN) Apply 1 application topically See admin instructions. Apply to skin folds, under breasts, abdominal folds, and inguinal folds twice a day for candidasis 11/30/21  Yes [provider]  ondansetron (ZOFRAN) 4 MG tablet Take 4 mg by mouth every 6 (six) hours as needed. 08/08/21  Yes [provider]  prazosin (MINIPRESS) 2 MG capsule Take 2 mg by mouth at bedtime. 04/25/21  Yes [provider]  solifenacin (VESICARE) 10 MG tablet Take 10 mg by mouth daily. 11/12/21  Yes [provider]  traZODone (DESYREL) 50 MG tablet Take 100 mg by mouth at bedtime. 04/25/21  Yes [provider]  vitamin B-12 (CYANOCOBALAMIN) 1000 MCG tablet Take 1,000 mcg by mouth daily.   Yes [provider]  Vitamin D, Ergocalciferol, (DRISDOL) 1.25 MG (50000 UNIT) CAPS capsule Take 50,000 Units by mouth every 7 (seven) days.   Yes [provider]  acetaminophen (TYLENOL) 650 MG CR tablet Take 650 mg by mouth every 8 (eight) hours as needed for pain. Patient not taking: Reported on 12/02/2021    [provider]  oxyCODONE (OXY IR/ROXICODONE) 5 MG immediate release tablet Take one tab po q4-6hrs prn pain, may need 1-2 first couple weeks Patient not taking: Reported on 12/02/2021 09/11/19   Chadwell, Vonna Kotyk, PA-C  oxyCODONE (OXY IR/ROXICODONE) 5 MG immediate release tablet Take 1-2 tablets (5-10 mg total) by mouth every 4 (four) hours as needed for moderate pain (pain score 4-6). Patient not taking: Reported on 12/02/2021 09/14/19   Chadwell, Vonna Kotyk, PA-C  terbinafine (LAMISIL) 250 MG tablet Take 250 mg by mouth daily. Patient not taking: Reported on 12/02/2021 04/19/21   [provider]    Allergies    Ambien [zolpidem tartrate], Coconut flavor, Codeine, Onion, Other, and Sulfa antibiotics  Review of Systems   Review of Systems  Constitutional:  Negative for chills and fever.  HENT:   Negative for congestion.   Respiratory:  Negative for shortness of breath.   Cardiovascular:  Negative for chest pain.  Gastrointestinal:  Negative for abdominal pain, diarrhea, nausea and vomiting.  Genitourinary:  Negative for enuresis.  Musculoskeletal:  Negative for back pain.       Pain on the right shoulder, left breast, right hip, right knee, and right foot.  Skin:  Negative for rash.  Neurological:  Negative for dizziness and headaches.  Hematological:  Does not bruise/bleed easily.   Physical Exam Updated Vital Signs BP (!) 157/66   Pulse (!) 53   Temp 98.2 F (36.8 C) (Oral)   Resp 18   Ht 5\' 7"  (1.702 m)   Wt 100.8 kg   SpO2 98%   BMI 34.81 kg/m   Physical Exam Vitals  and nursing note reviewed.  Constitutional:      General: She is not in acute distress.    Appearance: She is not ill-appearing.  HENT:     Head: Normocephalic and atraumatic.     Comments: No deformities the head present, no raccoon eyes or battle sign present.  Head and face nontender to palpation.    Nose: No congestion or rhinorrhea.     Mouth/Throat:     Comments: No oral trauma present, no trismus or torticollis. Eyes:     Conjunctiva/sclera: Conjunctivae normal.  Cardiovascular:     Rate and Rhythm: Normal rate and regular rhythm.     Pulses: Normal pulses.     Heart sounds: No murmur heard.   No friction rub. No gallop.  Pulmonary:     Effort: No respiratory distress.     Breath sounds: No wheezing, rhonchi or rales.     Comments: Patient noted tenderness along the rib midclavicular of the left side, no deformities present, no crepitus. Abdominal:     Palpations: Abdomen is soft.     Tenderness: There is no abdominal tenderness. There is no right CVA tenderness or left CVA tenderness.  Musculoskeletal:     Cervical back: No tenderness.     Comments: Spine was palpated was nontender to palpation, no step-off deformities present.  Patient had no tenderness along her right shoulder  proximal humerus, no deformities noted, she has tenderness along her right trochanter and right ischium there is no pelvis instability, no leg shortening, no internal or external rotation of the lower extremities.  Patient pain on her right lateral tibial plateau as well as her anterior aspect of the right talus again no deformities present, neurovascularly intact.    Skin:    General: Skin is warm and dry.     Comments: Small abrasion noted on the right patella superficial hemodynamically stable.  Small contusion noted on the left breast no other abnormalities present.  Neurological:     Mental Status: She is alert.     Comments: No facial asymmetry, no slurring of the words, able follow two-step commands, no unilateral weakness present.  Psychiatric:        Mood and Affect: Mood normal.    ED Results / Procedures / Treatments   Labs (all labs ordered are listed, but only abnormal results are displayed) Labs Reviewed - No data to display  EKG None  Radiology DG Ribs Unilateral W/Chest Left  Result Date: 12/02/2021 CLINICAL DATA:  [place], left second rib pain EXAM: LEFT RIBS AND CHEST - 3+ VIEW COMPARISON:  04/25/2021 FINDINGS: Frontal view of the chest as well as frontal and oblique views of the left thoracic cage are obtained. Cardiac silhouette is unremarkable. No airspace disease, effusion, or pneumothorax. There are no acute displaced fractures. IMPRESSION: 1. No acute intrathoracic process. Electronically Signed   By: Randa Ngo M.D.   On: 12/02/2021 17:35   DG Shoulder Right  Result Date: 12/02/2021 CLINICAL DATA:  Right shoulder pain after fall EXAM: RIGHT SHOULDER - 2+ VIEW COMPARISON:  None. FINDINGS: Frontal and transscapular views of the right shoulder are obtained. No fracture, subluxation, or dislocation. Mild acromioclavicular and glenohumeral joint osteoarthritis. Narrowing of the acromial humeral interval may reflect chronic longstanding rotator cuff tear. Right  chest is clear. IMPRESSION: 1. Degenerative changes.  No acute fracture. Electronically Signed   By: Randa Ngo M.D.   On: 12/02/2021 17:40   DG Ankle Complete Right  Result Date: 12/02/2021 CLINICAL DATA:  Right ankle pain after fall EXAM: RIGHT ANKLE - COMPLETE 3+ VIEW COMPARISON:  06/04/2005 FINDINGS: Frontal, oblique, lateral views of the right ankle are obtained. Prior healed distal right fibular fracture noted. No acute fracture, subluxation, or dislocation. There is mild osteoarthritis of the ankle and hindfoot. Prominent inferior calcaneal spur. Postsurgical changes across the first tarsometatarsal joint from previous fusion. IMPRESSION: 1. No acute fracture. 2. Chronic posttraumatic, postsurgical, and degenerative changes as above. Electronically Signed   By: Randa Ngo M.D.   On: 12/02/2021 17:39   CT Head Wo Contrast  Result Date: 12/02/2021 CLINICAL DATA:  Fall with head trauma.  Patient on aspirin. EXAM: CT HEAD WITHOUT CONTRAST TECHNIQUE: Contiguous axial images were obtained from the base of the skull through the vertex without intravenous contrast. COMPARISON:  06/04/2005 FINDINGS: Brain: Ventricles, cisterns and other CSF spaces are normal. There is no mass, mass effect, shift midline structures or acute hemorrhage. No evidence of acute infarction. Vascular: No hyperdense vessel or unexpected calcification. Skull: Normal. Negative for fracture or focal lesion. Sinuses/Orbits: Orbits are normal.  Paranasal sinuses are clear. Other: None. IMPRESSION: No acute findings. Electronically Signed   By: Marin Olp M.D.   On: 12/02/2021 16:57   DG Knee Complete 4 Views Right  Result Date: 12/02/2021 CLINICAL DATA:  Pain after fall EXAM: RIGHT KNEE - COMPLETE 4+ VIEW COMPARISON:  None. FINDINGS: Frontal, bilateral oblique, lateral views of the right knee are obtained. 3 component right knee arthroplasty is identified without evidence of acute complication. There are no acute displaced  fractures. No joint effusion. Soft tissues are unremarkable. IMPRESSION: 1. Unremarkable right knee arthroplasty.  No acute fracture. Electronically Signed   By: Randa Ngo M.D.   On: 12/02/2021 17:36   DG Hip Unilat With Pelvis 2-3 Views Right  Result Date: 12/02/2021 CLINICAL DATA:  Right hip pain after fall EXAM: DG HIP (WITH OR WITHOUT PELVIS) 2-3V RIGHT COMPARISON:  None. FINDINGS: Frontal view of the pelvis as well as frontal and frogleg lateral views of the right hip are obtained. No acute fracture, subluxation, or dislocation. Mild symmetrical bilateral hip osteoarthritis. Sacroiliac joints are normal. IMPRESSION: 1. Bilateral hip osteoarthritis.  No acute fracture. Electronically Signed   By: Randa Ngo M.D.   On: 12/02/2021 17:37    Procedures Procedures   Medications Ordered in ED Medications  acetaminophen (TYLENOL) tablet 1,000 mg (1,000 mg Oral Given 12/02/21 1729)    ED Course  I have reviewed the triage vital signs and the nursing notes.  Pertinent labs & imaging results that were available during my care of the patient were reviewed by me and considered in my medical decision making (see chart for details).  Clinical Course as of 12/02/21 1822  Sat Dec 02, 2021  1806 DG Hip Unilat With Pelvis 2-3 Views Right [WF]    Clinical Course User Index [WF] Marcello Fennel, PA-C   MDM Rules/Calculators/A&P                          Initial impression-presents after a fall, alert, no acute stress, no signs reassuring.  Concern for multiple orthopedic injuries, will obtain imaging, provide her with pain medication and reassess.  Work-up-CT head negative for acute findings, DG of right shoulder, right hip, right knee, and ankle all negative for acute findings.  Reassessment-reassessed patient is found asleep in bed no acute distress, she has no complaints this time, able to ambulate without difficulty.  Patient agreeable  for discharge.  Rule out-low suspicion for  intracranial head bleed as patient denies loss of conscious, is not on anticoagulant, she does not endorse headaches, paresthesia/weakness in the upper and lower extremities, no focal deficits present on my exam, CT head negative for acute findings.  Low suspicion for spinal cord abnormality or spinal fracture spine was palpated was nontender to palpation, patient has full range of motion in the upper and lower extremities.  Low suspicion for pneumothorax as lung sounds are clear bilaterally, chest x-ray unremarkable.  Low suspicion for intrathoracic trauma as she has no chest pain, no pleuritic chest pain, no shortness of breath, vital signs reassuring, nontachypneic, nonhypoxic, low back of injury, fall from standing level.  Low suspicion for intra-abdominal trauma as abdomen soft nontender to palpation.  Low suspicion for orthopedic injury as imaging is negative for acute findings.   Plan-  Fall-likely pain is muscular strains, will recommend over-the-counter pain medications, follow-up PCP for further evaluation.  Given strict return precautions.  Vital signs have remained stable, no indication for hospital admission.    Patient given at home care as well strict return precautions.  Patient verbalized that they understood agreed to said plan.  Final Clinical Impression(s) / ED Diagnoses Final diagnoses:  Fall, initial encounter    Rx / DC Orders ED Discharge Orders     None        Aron Baba 12/02/21 Shawnee Knapp, MD 12/03/21 0005

## 2021-12-02 NOTE — Discharge Instructions (Signed)
Imaging all reassuring, recommend over-the-counter pain medication as needed.  You will be sore over the next couple of days I recommend applying warm compress to these area and stretching them out as this will help with pain.  Follow-up with PCP as needed.  Come back to the emergency department if you develop chest pain, shortness of breath, severe abdominal pain, uncontrolled nausea, vomiting, diarrhea.

## 2021-12-02 NOTE — ED Notes (Signed)
Attempted to call facility multiple times without answer to inform them pt is being d/c.   Left a voicemail with call back number

## 2021-12-02 NOTE — ED Notes (Signed)
Called [facility] and and gave report to patients RN Loma Sousa). Explain that pt is being transported back to facility via EMS.

## 2021-12-05 ENCOUNTER — Encounter: Payer: Self-pay | Admitting: Urology

## 2021-12-05 ENCOUNTER — Other Ambulatory Visit: Payer: Self-pay

## 2021-12-05 ENCOUNTER — Ambulatory Visit (INDEPENDENT_AMBULATORY_CARE_PROVIDER_SITE_OTHER): Payer: Medicare Other | Admitting: Urology

## 2021-12-05 VITALS — BP 153/73 | HR 76

## 2021-12-05 DIAGNOSIS — R350 Frequency of micturition: Secondary | ICD-10-CM | POA: Diagnosis not present

## 2021-12-05 DIAGNOSIS — N3941 Urge incontinence: Secondary | ICD-10-CM

## 2021-12-05 NOTE — Progress Notes (Signed)
Urological Symptom Review  Patient is experiencing the following symptoms: Frequent urination Hard to postpone urination Get up at night to urinate Leakage of urine   Review of Systems  Gastrointestinal (upper)  : Negative for upper GI symptoms  Gastrointestinal (lower) : Diarrhea  Constitutional : Fatigue  Skin: Itching  Eyes: Negative for eye symptoms  Ear/Nose/Throat : Negative for Ear/Nose/Throat symptoms  Hematologic/Lymphatic: Easy bruising  Cardiovascular : Negative for cardiovascular symptoms  Respiratory : Negative for respiratory symptoms  Endocrine: Excessive thirst  Musculoskeletal: Joint pain  Neurological: Negative for neurological symptoms  Psychologic: Anxiety

## 2021-12-05 NOTE — Progress Notes (Signed)
Assessment: 1. Urge incontinence   2. Urine frequency     Plan: Continue solifenacin 10 mg - change to taking in the evening to hopefully improve her nighttime symptoms Return to office in 2 months  She is a resident at [facility] [place]:  Chief Complaint  Patient presents with   Urinary Incontinence   History of Present Illness:  [name] is a 70 y.o. year old female who is seen for further evaluation of urinary incontinence.  At her initial visit in 11/22, she reported an 78-month history of frequency, urgency, and nocturia.  She was voiding at least every 2 hours.  She reported urge incontinence as well as nighttime incontinence.  She was using multiple pads and pull-ups throughout the day.  She reported voiding with a good stream and felt like she emptied her bladder completely.  No dysuria or gross hematuria.  No history of UTIs.  No fecal incontinence.  She was on oxybutynin 5 mg daily without improvement.  She had not tried any other medical therapy. PVR:  49 ml. She was given a trial of Vesicare 10 mg daily at her visit in 11/22.  She returns today for follow-up.  She is taking the Vesicare every morning.  She has noted improvement in her daytime incontinence.  She is generally able to get to the bathroom without leakage.  She continues to have nighttime incontinence.  She has decreased her daytime pull-up use but continues to require several pads through the night.  No dysuria or gross hematuria.  No side effects from the Vesicare.  Portions of the above documentation were copied from a prior visit for review purposes only.  Past Medical History:  Past Medical History:  Diagnosis Date   Anemia    as a teenager   Anxiety    Arthritis    knees, rt hand,hips   Cancer (HCC)    vaginal,uterine,ovariam   COPD (chronic obstructive pulmonary disease) (HCC)    Depression    Diabetes mellitus without complication (HCC)    Hypothyroidism     Neuromuscular disorder ([place])    hands and feet    Past Surgical History:  Past Surgical History:  Procedure Laterality Date   ABDOMINAL HYSTERECTOMY     BREAST SURGERY Right 1988   lumpectomy   EYE SURGERY Bilateral    FRACTURE SURGERY     Rt ankle,lt knee cap,lt shoulder,rt wrist,rt shoulder,   TONSILLECTOMY     at age 73   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   Procedure: TOTAL KNEE ARTHROPLASTY;  Surgeon: Earlie Server, MD;  Location: [facility];  Service: Orthopedics;  Laterality: Right;    Allergies:  Allergies  Allergen Reactions   Ambien [Zolpidem Tartrate] Other (See Comments)    Hallucinations/nightmares (tried to jump out of a window)   Coconut Flavor Swelling    Mouth swells   Codeine Nausea And Vomiting   Onion Nausea Only and Other (See Comments)    Raw onions   Other     Ground Beef (patient avoid due to side effect of diarrhea)   Sulfa Antibiotics Rash and Other (See Comments)    Including topical sulfa  (mouth dries/peels/skin blisters)    Family History:  No family history on file.  Social History:  Social History   Tobacco Use   Smoking status: Former    Packs/day: 0.50    Years: 15.00    Pack years: 7.50    Types: Cigarettes  Quit date: 2013    Years since quitting: 9.9   Smokeless tobacco: Never  Vaping Use   Vaping Use: Never used  Substance Use Topics   Alcohol use: Never   Drug use: Never    ROS: Constitutional:  Negative for fever, chills, weight loss CV: Negative for chest pain, previous MI, hypertension Respiratory:  Negative for shortness of breath, wheezing, sleep apnea, frequent cough GI:  Negative for nausea, vomiting, bloody stool, GERD  Physical exam: BP (!) 153/73   Pulse 76  GENERAL APPEARANCE:  Well appearing, well developed, well nourished, NAD HEENT:  Atraumatic, normocephalic, oropharynx clear NECK:  Supple without lymphadenopathy or thyromegaly ABDOMEN:  Soft, non-tender, no masses EXTREMITIES:  Moves all  extremities well, without clubbing, cyanosis, or edema NEUROLOGIC:  Alert and oriented x 3, normal gait, CN II-XII grossly intact MENTAL STATUS:  appropriate BACK:  Non-tender to palpation, No CVAT SKIN:  Warm, dry, and intact   Results: No specimen provided

## 2022-02-08 ENCOUNTER — Ambulatory Visit (INDEPENDENT_AMBULATORY_CARE_PROVIDER_SITE_OTHER): Payer: 59 | Admitting: Urology

## 2022-02-08 ENCOUNTER — Encounter: Payer: Self-pay | Admitting: Urology

## 2022-02-08 ENCOUNTER — Other Ambulatory Visit: Payer: Self-pay

## 2022-02-08 VITALS — BP 145/60 | HR 58 | Wt 222.0 lb

## 2022-02-08 DIAGNOSIS — N3941 Urge incontinence: Secondary | ICD-10-CM

## 2022-02-08 NOTE — Progress Notes (Signed)
Assessment: 1. Urge incontinence     Plan: Discontinue Solifenacin Begin trospium 20 mg p.o. twice daily. Schedule for urodynamics at [facility] Return to office after urodynamics completed  She is a resident at [facility]  Chief Complaint:  Chief Complaint  Patient presents with   Urinary Incontinence   History of Present Illness:  [name] is a 71 y.o. year old female who is seen for further evaluation of urinary incontinence.  At her initial visit in 11/22, she reported an 35-month history of frequency, urgency, and nocturia.  She was voiding at least every 2 hours.  She reported urge incontinence as well as nighttime incontinence.  She was using multiple pads and pull-ups throughout the day.  She reported voiding with a good stream and felt like she emptied her bladder completely.  No dysuria or gross hematuria.  No history of UTIs.  No fecal incontinence.  She was on oxybutynin 5 mg daily without improvement.  She had not tried any other medical therapy. PVR:  49 ml. She was given a trial of Vesicare 10 mg daily at her visit in 11/22.  At her return visit in 12/22, she was taking the Vesicare every morning.  She had noted improvement in her daytime incontinence.  She reported the ability to get to the bathroom without leakage.  She continued to have nighttime incontinence.  She had decreased her daytime pull-up use but continued to require several pads through the night.  No dysuria or gross hematuria.  No side effects from the Vesicare.  She returns today for follow-up.  She is currently taking Vesicare 10 mg.  She continues to report frequency, urgency, nocturia, and urge incontinence.  She is trying to void every 2 hours during the nighttime to avoid incontinence episodes.  She continues to have some urge incontinence during the daytime.  No dysuria or gross hematuria.  Portions of the above documentation were copied from a prior visit for review  purposes only.  Past Medical History:  Past Medical History:  Diagnosis Date   Anemia    as a teenager   Anxiety    Arthritis    knees, rt hand,hips   Cancer (HCC)    vaginal,uterine,ovariam   COPD (chronic obstructive pulmonary disease) (HCC)    Depression    Diabetes mellitus without complication (HCC)    Hypothyroidism    Neuromuscular disorder ([place])    hands and feet    Past Surgical History:  Past Surgical History:  Procedure Laterality Date   ABDOMINAL HYSTERECTOMY     BREAST SURGERY Right 1988   lumpectomy   EYE SURGERY Bilateral    FRACTURE SURGERY     Rt ankle,lt knee cap,lt shoulder,rt wrist,rt shoulder,   TONSILLECTOMY     at age 65   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   Procedure: TOTAL KNEE ARTHROPLASTY;  Surgeon: Earlie Server, MD;  Location: [facility];  Service: Orthopedics;  Laterality: Right;    Allergies:  Allergies  Allergen Reactions   Ambien [Zolpidem Tartrate] Other (See Comments)    Hallucinations/nightmares (tried to jump out of a window)   Coconut Flavor Swelling    Mouth swells   Codeine Nausea And Vomiting   Onion Nausea Only and Other (See Comments)    Raw onions   Other     Ground Beef (patient avoid due to side effect of diarrhea)   Sulfa Antibiotics Rash and Other (See Comments)    Including topical sulfa  (  mouth dries/peels/skin blisters)    Family History:  No family history on file.  Social History:  Social History   Tobacco Use   Smoking status: Former    Packs/day: 0.50    Years: 15.00    Pack years: 7.50    Types: Cigarettes    Quit date: 2013    Years since quitting: 10.1   Smokeless tobacco: Never  Vaping Use   Vaping Use: Never used  Substance Use Topics   Alcohol use: Never   Drug use: Never    ROS: Constitutional:  Negative for fever, chills, weight loss CV: Negative for chest pain, previous MI, hypertension Respiratory:  Negative for shortness of breath, wheezing, sleep apnea, frequent  cough GI:  Negative for nausea, vomiting, bloody stool, GERD  Physical exam: BP (!) 145/60 (BP Location: Right Arm)    Pulse (!) 58    Wt 222 lb (100.7 kg)    BMI 34.77 kg/m  GENERAL APPEARANCE:  Well appearing, well developed, well nourished, NAD HEENT:  Atraumatic, normocephalic, oropharynx clear NECK:  Supple without lymphadenopathy or thyromegaly ABDOMEN:  Soft, non-tender, no masses EXTREMITIES:  Moves all extremities well, without clubbing, cyanosis, or edema NEUROLOGIC:  Alert and oriented x 3, normal gait, CN II-XII grossly intact MENTAL STATUS:  appropriate BACK:  Non-tender to palpation, No CVAT SKIN:  Warm, dry, and intact   Results: Patient unable to provide specimen  PVR:  4 ml

## 2022-03-14 ENCOUNTER — Other Ambulatory Visit: Payer: Self-pay

## 2022-03-14 ENCOUNTER — Encounter: Payer: Self-pay | Admitting: Urology

## 2022-03-14 ENCOUNTER — Ambulatory Visit (INDEPENDENT_AMBULATORY_CARE_PROVIDER_SITE_OTHER): Payer: 59 | Admitting: Urology

## 2022-03-14 VITALS — BP 159/78 | HR 67

## 2022-03-14 DIAGNOSIS — R350 Frequency of micturition: Secondary | ICD-10-CM

## 2022-03-14 DIAGNOSIS — N3941 Urge incontinence: Secondary | ICD-10-CM

## 2022-03-14 MED ORDER — MIRABEGRON ER 50 MG PO TB24: 50.0000 mg | ORAL_TABLET | Freq: Every evening | ORAL | 0 refills | Status: DC

## 2022-03-14 NOTE — Progress Notes (Signed)
? ?Assessment: ?1. Urge incontinence   ?2. Urine frequency   ? ? ? ?Plan: ?I reviewed the urodynamic study with the patient in detail today. ?I again discussed the diagnosis and management of overactive bladder and urge incontinence.  I discussed options including continued medical therapy, neuromodulation (PTNS, InterStim), and chemodenervation with Botox.  Risk of each treatment reviewed. ?She would like to continue to try medical therapy at this time. ?Discontinue trospium ?Trial of Myrbetriq 50 mg daily.  Samples provided. ?I advised her to call with results of medication trial in approximately 4 weeks. ?Information on OAB, PTNS, InterStim, and Botox provided. ?Return to office in 2 months ? ?She is a resident at [facility] ? ?Chief Complaint:  ?Chief Complaint  ?Patient presents with  ? Urinary Incontinence  ? ?History of Present Illness: ? ?[name] is a 71 y.o. year old female who is seen for further evaluation of urinary incontinence.  At her initial visit in 11/22, she reported an 94-monthhistory of frequency, urgency, and nocturia.  She was voiding at least every 2 hours.  She reported urge incontinence as well as nighttime incontinence.  She was using multiple pads and pull-ups throughout the day.  She reported voiding with a good stream and felt like she emptied her bladder completely.  No dysuria or gross hematuria.  No history of UTIs.  No fecal incontinence.  She was on oxybutynin 5 mg daily without improvement.  She had not tried any other medical therapy. ?PVR:  49 ml. ?She was given a trial of Vesicare 10 mg daily at her visit in 11/22. ? ?At her return visit in 12/22, she was taking the Vesicare every morning.  She had noted improvement in her daytime incontinence.  She reported the ability to get to the bathroom without leakage.  She continued to have nighttime incontinence.  She had decreased her daytime pull-up use but continued to require several pads through the  night.  No dysuria or gross hematuria.  No side effects from the Vesicare. ? ?Despite taking Vesicare 10 mg, she continued to report frequency, urgency, nocturia, and urge incontinence.  She was trying to void every 2 hours during the nighttime to avoid incontinence episodes.  She continued to have some urge incontinence during the daytime.  No dysuria or gross hematuria. ? ?She underwent evaluation with urodynamics on 02/26/2022.  The study showed a slightly decreased capacity of 394 mL.  She did have unstable bladder contractions with associated leakage.  No stress incontinence.  She was able to void with some straining.  PVR = 148 mL. ? ?She was given a trial of trospium 20 mg twice daily at her visit on 02/08/2022. ?She has not seen any improvement in her incontinence with this medication.  She continues to have frequency, urgency, urge incontinence, and nocturia.  She continues to require incontinence pads.  No dysuria or gross hematuria. ? ?Portions of the above documentation were copied from a prior visit for review purposes only. ? ?Past Medical History:  ?Past Medical History:  ?Diagnosis Date  ? Anemia   ? as a teenager  ? Anxiety   ? Arthritis   ? knees, rt hand,hips  ? Cancer ([facility]   ? vaginal,uterine,ovariam  ? COPD (chronic obstructive pulmonary disease) ([place]   ? Depression   ? [place]   ? Hypothyroidism   ? Neuromuscular disorder ([place]   ? hands and feet  ? ? ?Past Surgical History:  ?  Past Surgical History:  ?Procedure Laterality Date  ? ABDOMINAL HYSTERECTOMY    ? BREAST SURGERY Right 1988  ? lumpectomy  ? EYE SURGERY Bilateral   ? FRACTURE SURGERY    ? Rt ankle,lt knee cap,lt shoulder,rt wrist,rt shoulder,  ? TONSILLECTOMY    ? at age 65  ? 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 Procedure: TOTAL KNEE ARTHROPLASTY;  Surgeon: Earlie Server, MD;  Location: [facility];  Service: Orthopedics;  Laterality: Right;  ? ? ?Allergies:  ?Allergies  ?Allergen Reactions  ?  Ambien [Zolpidem Tartrate] Other (See Comments)  ?  Hallucinations/nightmares (tried to jump out of a window)  ? Coconut Flavor Swelling  ?  Mouth swells  ? Codeine Nausea And Vomiting  ? Onion Nausea Only and Other (See Comments)  ?  Raw onions  ? Other   ?  Ground Beef (patient avoid due to side effect of diarrhea)  ? Sulfa Antibiotics Rash and Other (See Comments)  ?  Including topical sulfa  ?(mouth dries/peels/skin blisters)  ? ? ?Family History:  ?No family history on file. ? ?Social History:  ?Social History  ? ?Tobacco Use  ? Smoking status: Former  ?  Packs/day: 0.50  ?  Years: 15.00  ?  Pack years: 7.50  ?  Types: Cigarettes  ?  Quit date: 2013  ?  Years since quitting: 10.2  ? Smokeless tobacco: Never  ?Vaping Use  ? Vaping Use: Never used  ?Substance Use Topics  ? Alcohol use: Never  ? Drug use: Never  ? ? ?ROS: ?Constitutional:  Negative for fever, chills, weight loss ?CV: Negative for chest pain, previous MI, hypertension ?Respiratory:  Negative for shortness of breath, wheezing, sleep apnea, frequent cough ?GI:  Negative for nausea, vomiting, bloody stool, GERD ? ?Physical exam: ?BP (!) 159/78   Pulse 67  ?GENERAL APPEARANCE:  Well appearing, well developed, well nourished, NAD ?HEENT:  Atraumatic, normocephalic, oropharynx clear ?NECK:  Supple without lymphadenopathy or thyromegaly ?ABDOMEN:  Soft, non-tender, no masses ?EXTREMITIES:  Moves all extremities well, without clubbing, cyanosis, or edema ?NEUROLOGIC:  Alert and oriented x 3, normal gait, CN II-XII grossly intact ?MENTAL STATUS:  appropriate ?BACK:  Non-tender to palpation, No CVAT ?SKIN:  Warm, dry, and intact ? ? ?Results: ?No specimen provided ?

## 2022-05-14 ENCOUNTER — Encounter: Payer: Self-pay | Admitting: Urology

## 2022-05-14 ENCOUNTER — Ambulatory Visit (INDEPENDENT_AMBULATORY_CARE_PROVIDER_SITE_OTHER): Payer: 59 | Admitting: Urology

## 2022-05-14 VITALS — BP 147/65 | HR 61

## 2022-05-14 DIAGNOSIS — R350 Frequency of micturition: Secondary | ICD-10-CM

## 2022-05-14 DIAGNOSIS — R351 Nocturia: Secondary | ICD-10-CM

## 2022-05-14 DIAGNOSIS — N3941 Urge incontinence: Secondary | ICD-10-CM

## 2022-05-14 MED ORDER — GEMTESA 75 MG PO TABS
75.0000 mg | ORAL_TABLET | Freq: Every day | ORAL | 0 refills | Status: DC
Start: 2022-05-14 — End: 2024-01-08

## 2022-05-14 NOTE — Progress Notes (Signed)
Assessment: 1. Urge incontinence   2. Urine frequency     Plan: Discontinue Myrbetriq due to lack of effectiveness.   Trial of Gemtesa 75 mg daily.  Samples given.   I again discussed additional options for management of her urge incontinence including PTNS, InterStim, and Botox. She would like to try PTNS. Schedule for weekly PTNS x12 weeks.     Chief Complaint:  Chief Complaint  Patient presents with   Urinary Incontinence   History of Present Illness:  [name] is a 71 y.o. year old female who is seen for further evaluation of urinary incontinence.  At her initial visit in 11/22, she reported an 88-monthhistory of frequency, urgency, and nocturia.  She was voiding at least every 2 hours.  She reported urge incontinence as well as nighttime incontinence.  She was using multiple pads and pull-ups throughout the day.  She reported voiding with a good stream and felt like she emptied her bladder completely.  No dysuria or gross hematuria.  No history of UTIs.  No fecal incontinence.  She was on oxybutynin 5 mg daily without improvement.  She had not tried any other medical therapy. PVR:  49 ml. She was given a trial of Vesicare 10 mg daily at her visit in 11/22.  At her return visit in 12/22, she was taking the Vesicare every morning.  She had noted improvement in her daytime incontinence.  She reported the ability to get to the bathroom without leakage.  She continued to have nighttime incontinence.  She had decreased her daytime pull-up use but continued to require several pads through the night.  No dysuria or gross hematuria.  No side effects from the Vesicare.  Despite taking Vesicare 10 mg, she continued to report frequency, urgency, nocturia, and urge incontinence.  She was trying to void every 2 hours during the nighttime to avoid incontinence episodes.  She continued to have some urge incontinence during the daytime.  No dysuria or gross hematuria.  She underwent  evaluation with urodynamics on 02/26/2022.  The study showed a slightly decreased capacity of 394 mL.  She did have unstable bladder contractions with associated leakage.  No stress incontinence.  She was able to void with some straining.  PVR = 148 mL.  She was given a trial of trospium 20 mg twice daily at her visit on 02/08/2022 but did not notice any improvement in her incontinence with this medication.  She continued to have frequency, urgency, urge incontinence, and nocturia.  She continued to require incontinence pads.  No dysuria or gross hematuria. Treatment options were discussed.  She elected to continue to try medical therapy.  She was given a trial of Myrbetriq 50 mg daily.  She returns today for follow-up.  She noted some initial improvement with Myrbetriq but this only lasted for approximately a week.  Since that time, despite continuing Myrbetriq, she continues to have frequency, urgency, nocturia, and incontinence with urgency and throughout the night.  No dysuria or gross hematuria.  Portions of the above documentation were copied from a prior visit for review purposes only.  Past Medical History:  Past Medical History:  Diagnosis Date   Anemia    as a teenager   Anxiety    Arthritis    knees, rt hand,hips   Cancer ([facility]    vaginal,uterine,ovariam   COPD (chronic obstructive pulmonary disease) (HCC)    Depression    Diabetes mellitus without complication (HCC)    Hypothyroidism  Neuromuscular disorder ([place])    hands and feet    Past Surgical History:  Past Surgical History:  Procedure Laterality Date   ABDOMINAL HYSTERECTOMY     BREAST SURGERY Right 1988   lumpectomy   EYE SURGERY Bilateral    FRACTURE SURGERY     Rt ankle,lt knee cap,lt shoulder,rt wrist,rt shoulder,   TONSILLECTOMY     at age 83   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   Procedure: TOTAL KNEE ARTHROPLASTY;  Surgeon: Earlie Server, MD;  Location: [facility];  Service: Orthopedics;  Laterality:  Right;    Allergies:  Allergies  Allergen Reactions   Ambien [Zolpidem Tartrate] Other (See Comments)    Hallucinations/nightmares (tried to jump out of a window)   Coconut Flavor Swelling    Mouth swells   Codeine Nausea And Vomiting   Onion Nausea Only and Other (See Comments)    Raw onions   Other     Ground Beef (patient avoid due to side effect of diarrhea)   Sulfa Antibiotics Rash and Other (See Comments)    Including topical sulfa  (mouth dries/peels/skin blisters)    Family History:  No family history on file.  Social History:  Social History   Tobacco Use   Smoking status: Former    Packs/day: 0.50    Years: 15.00    Pack years: 7.50    Types: Cigarettes    Quit date: 2013    Years since quitting: 10.3   Smokeless tobacco: Never  Vaping Use   Vaping Use: Never used  Substance Use Topics   Alcohol use: Never   Drug use: Never    ROS: Constitutional:  Negative for fever, chills, weight loss CV: Negative for chest pain, previous MI, hypertension Respiratory:  Negative for shortness of breath, wheezing, sleep apnea, frequent cough GI:  Negative for nausea, vomiting, bloody stool, GERD  Physical exam: BP (!) 147/65   Pulse 61  GENERAL APPEARANCE:  Well appearing, well developed, well nourished, NAD HEENT:  Atraumatic, normocephalic, oropharynx clear NECK:  Supple without lymphadenopathy or thyromegaly ABDOMEN:  Soft, non-tender, no masses EXTREMITIES:  Moves all extremities well, without clubbing, cyanosis, or edema NEUROLOGIC:  Alert and oriented x 3, normal gait, CN II-XII grossly intact MENTAL STATUS:  appropriate BACK:  Non-tender to palpation, No CVAT SKIN:  Warm, dry, and intact   Results: No sample provided

## 2022-05-22 ENCOUNTER — Ambulatory Visit (INDEPENDENT_AMBULATORY_CARE_PROVIDER_SITE_OTHER): Payer: 59 | Admitting: Family Medicine

## 2022-05-22 ENCOUNTER — Encounter: Payer: Self-pay | Admitting: *Deleted

## 2022-05-22 ENCOUNTER — Encounter: Payer: Self-pay | Admitting: Family Medicine

## 2022-05-22 VITALS — BP 118/62 | HR 73 | Ht 66.0 in | Wt 219.6 lb

## 2022-05-22 DIAGNOSIS — F515 Nightmare disorder: Secondary | ICD-10-CM

## 2022-05-22 DIAGNOSIS — G4700 Insomnia, unspecified: Secondary | ICD-10-CM | POA: Insufficient documentation

## 2022-05-22 DIAGNOSIS — B009 Herpesviral infection, unspecified: Secondary | ICD-10-CM

## 2022-05-22 DIAGNOSIS — G309 Alzheimer's disease, unspecified: Secondary | ICD-10-CM

## 2022-05-22 DIAGNOSIS — E119 Type 2 diabetes mellitus without complications: Secondary | ICD-10-CM | POA: Insufficient documentation

## 2022-05-22 DIAGNOSIS — N3941 Urge incontinence: Secondary | ICD-10-CM

## 2022-05-22 DIAGNOSIS — G4709 Other insomnia: Secondary | ICD-10-CM | POA: Diagnosis not present

## 2022-05-22 DIAGNOSIS — E039 Hypothyroidism, unspecified: Secondary | ICD-10-CM | POA: Insufficient documentation

## 2022-05-22 DIAGNOSIS — F419 Anxiety disorder, unspecified: Secondary | ICD-10-CM

## 2022-05-22 DIAGNOSIS — F0390 Unspecified dementia without behavioral disturbance: Secondary | ICD-10-CM | POA: Insufficient documentation

## 2022-05-22 DIAGNOSIS — F02A4 Dementia in other diseases classified elsewhere, mild, with anxiety: Secondary | ICD-10-CM

## 2022-05-22 DIAGNOSIS — Z1211 Encounter for screening for malignant neoplasm of colon: Secondary | ICD-10-CM

## 2022-05-22 DIAGNOSIS — Z23 Encounter for immunization: Secondary | ICD-10-CM

## 2022-05-22 DIAGNOSIS — E038 Other specified hypothyroidism: Secondary | ICD-10-CM

## 2022-05-22 DIAGNOSIS — Z1159 Encounter for screening for other viral diseases: Secondary | ICD-10-CM

## 2022-05-22 DIAGNOSIS — F03A4 Unspecified dementia, mild, with anxiety: Secondary | ICD-10-CM

## 2022-05-22 DIAGNOSIS — R7301 Impaired fasting glucose: Secondary | ICD-10-CM

## 2022-05-22 DIAGNOSIS — Z9189 Other specified personal risk factors, not elsewhere classified: Secondary | ICD-10-CM

## 2022-05-22 DIAGNOSIS — F0280 Dementia in other diseases classified elsewhere without behavioral disturbance: Secondary | ICD-10-CM

## 2022-05-22 DIAGNOSIS — Z794 Long term (current) use of insulin: Secondary | ICD-10-CM

## 2022-05-22 DIAGNOSIS — F32A Depression, unspecified: Secondary | ICD-10-CM

## 2022-05-22 DIAGNOSIS — E1100 Type 2 diabetes mellitus with hyperosmolarity without nonketotic hyperglycemic-hyperosmolar coma (NKHHC): Secondary | ICD-10-CM | POA: Diagnosis not present

## 2022-05-22 DIAGNOSIS — Z91030 Bee allergy status: Secondary | ICD-10-CM

## 2022-05-22 DIAGNOSIS — N39498 Other specified urinary incontinence: Secondary | ICD-10-CM

## 2022-05-22 DIAGNOSIS — E559 Vitamin D deficiency, unspecified: Secondary | ICD-10-CM

## 2022-05-22 DIAGNOSIS — E7849 Other hyperlipidemia: Secondary | ICD-10-CM

## 2022-05-22 MED ORDER — PRAZOSIN HCL 2 MG PO CAPS: 2.0000 mg | ORAL_CAPSULE | Freq: Every day | ORAL | 1 refills | Status: DC

## 2022-05-22 MED ORDER — ESCITALOPRAM OXALATE 20 MG PO TABS: 20.0000 mg | ORAL_TABLET | Freq: Every day | ORAL | 2 refills | Status: DC

## 2022-05-22 MED ORDER — TRAZODONE HCL 50 MG PO TABS: 100.0000 mg | ORAL_TABLET | Freq: Every day | ORAL | 2 refills | Status: DC

## 2022-05-22 MED ORDER — LEVOTHYROXINE SODIUM 112 MCG PO TABS: 112.0000 ug | ORAL_TABLET | Freq: Every day | ORAL | 1 refills | Status: DC

## 2022-05-22 MED ORDER — ATORVASTATIN CALCIUM 20 MG PO TABS: 20.0000 mg | ORAL_TABLET | Freq: Every day | ORAL | 1 refills | Status: DC

## 2022-05-22 MED ORDER — TRUE METRIX BLOOD GLUCOSE TEST VI STRP: 1.0000 | ORAL_STRIP | 2 refills | Status: DC | PRN

## 2022-05-22 MED ORDER — ACYCLOVIR 400 MG PO TABS: 400.0000 mg | ORAL_TABLET | Freq: Two times a day (BID) | ORAL | 1 refills | Status: DC

## 2022-05-22 MED ORDER — DONEPEZIL HCL 10 MG PO TABS: 10.0000 mg | ORAL_TABLET | Freq: Every day | ORAL | 1 refills | Status: DC

## 2022-05-22 MED ORDER — BD AUTOSHIELD DUO 30G X 5 MM MISC: 2 refills | Status: DC

## 2022-05-22 MED ORDER — EPINEPHRINE 0.3 MG/0.3ML IJ SOAJ: 0.3000 mg | INTRAMUSCULAR | 2 refills | Status: DC | PRN

## 2022-05-22 NOTE — Progress Notes (Signed)
New Patient Office Visit  Subjective:  Patient ID: [name], female    DOB: Nov 02, 1951  Age: 71 y.o. MRN: [id number]  CC:  Chief Complaint  Patient presents with   New Patient (Initial Visit)    Pt here to establish care.     HPI [name] is a 71 y.o. female with past medical history of T2DM and Urge incontinence presents for establishing care. Urge incontinence/ overactive bladder: she follows up with her urologists. Her next appt on 06/09/2022. She reports treatment failure with myrbetriq and is currently taking a trial of gemtesa.  T2DM: highest blood glucose is 199, and the lowest is 82. She reports checking her blood sugar four times daily. Her blood glucose this morning was 134. She denies symptoms of polyuria, polydipsia, and polyphagia.  She received her shingles and tdap vaccines today.    Past Medical History:  Diagnosis Date   Anemia    as a teenager   Anxiety    Arthritis    knees, rt hand,hips   Cancer (HCC)    vaginal,uterine,ovariam   COPD (chronic obstructive pulmonary disease) (HCC)    Depression    Diabetes mellitus without complication (HCC)    Hypothyroidism    Neuromuscular disorder ([place])    hands and feet    Past Surgical History:  Procedure Laterality Date   ABDOMINAL HYSTERECTOMY     BREAST SURGERY Right 1988   lumpectomy   EYE SURGERY Bilateral    FRACTURE SURGERY     Rt ankle,lt knee cap,lt shoulder,rt wrist,rt shoulder,   TONSILLECTOMY     at age 84   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   Procedure: TOTAL KNEE ARTHROPLASTY;  Surgeon: Earlie Server, MD;  Location: [facility];  Service: Orthopedics;  Laterality: Right;    History reviewed. No pertinent family history.  Social History   Socioeconomic History   Marital status: Married    Spouse name: Not on file   Number of children: Not on file   Years of education: Not on file   Highest education level: Not on file  Occupational History   Not on file   Tobacco Use   Smoking status: Former    Packs/day: 0.50    Years: 15.00    Pack years: 7.50    Types: Cigarettes    Quit date: 2013    Years since quitting: 10.4   Smokeless tobacco: Never  Vaping Use   Vaping Use: Never used  Substance and Sexual Activity   Alcohol use: Never   Drug use: Never   Sexual activity: Not on file  Other Topics Concern   Not on file  Social History Narrative   Not on file   Social Determinants of Health   Financial Resource Strain: Not on file  Food Insecurity: Not on file  Transportation Needs: Not on file  Physical Activity: Not on file  Stress: Not on file  Social Connections: Not on file  Intimate Partner Violence: Not on file    ROS Review of Systems  Constitutional:  Negative for chills, fatigue and fever.  HENT:  Positive for rhinorrhea. Negative for sinus pressure and sinus pain.   Eyes:  Negative for pain, redness and itching.  Respiratory:  Negative for chest tightness and shortness of breath.   Cardiovascular:  Negative for chest pain and palpitations.  Gastrointestinal:  Negative for nausea and vomiting.  Endocrine: Negative for polydipsia, polyphagia and polyuria.  Genitourinary:  Positive for frequency and urgency.  Musculoskeletal:  Positive for arthralgias and neck pain.  Skin:  Negative for rash and wound.  Neurological:  Negative for weakness and headaches.  Psychiatric/Behavioral:  Negative for confusion, self-injury and suicidal ideas.    Objective:   Today's Vitals: BP 118/62   Pulse 73   Ht $R'5\' 6"'Ze$  (1.676 m)   Wt 219 lb 9.6 oz (99.6 kg)   SpO2 95%   BMI 35.44 kg/m   Physical Exam Constitutional:      Appearance: Normal appearance.  HENT:     Head: Normocephalic.     Right Ear: External ear normal.     Left Ear: External ear normal.     Nose: Nose normal. No congestion.     Mouth/Throat:     Mouth: Mucous membranes are moist.  Eyes:     Extraocular Movements: Extraocular movements intact.     Pupils:  Pupils are equal, round, and reactive to light.  Cardiovascular:     Rate and Rhythm: Normal rate.     Pulses: Normal pulses.     Heart sounds: Normal heart sounds.  Pulmonary:     Effort: Pulmonary effort is normal.     Breath sounds: Normal breath sounds.  Abdominal:     Palpations: Abdomen is soft.  Musculoskeletal:     Cervical back: No rigidity.     Right lower leg: No edema.     Left lower leg: No edema.  Skin:    General: Skin is warm.     Findings: No lesion or rash.  Neurological:     Mental Status: She is alert and oriented to person, place, and time.  Psychiatric:     Comments: Normal affect    Assessment & Plan:   Problem List Items Addressed This Visit       Endocrine   T2DM (type 2 diabetes mellitus) ([place]) - Primary    -refills insulin pen needles and test strips -pending HgA1c - will refer to the endocrinologist for management of the patient's insulin -reports taking insulin for about 20 years       Relevant Medications   atorvastatin (LIPITOR) 20 MG tablet   glucose blood (TRUE METRIX BLOOD GLUCOSE TEST) test strip   Insulin Pen Needle (BD AUTOSHIELD DUO) 30G X 5 MM MISC   Other Relevant Orders   CBC with Differential/Platelet   CMP14+EGFR   TSH + free T4   Lipid panel   Ambulatory referral to Endocrinology   Hypothyroidism    No complaints or concerns today Pending labs Refilled Synthroid       Relevant Medications   levothyroxine (SYNTHROID) 112 MCG tablet     Nervous and Auditory   Dementia (HCC)    Refilled aricept and encouraged to continue therapy       Relevant Medications   donepezil (ARICEPT) 10 MG tablet   escitalopram (LEXAPRO) 20 MG tablet   traZODone (DESYREL) 50 MG tablet     Other   Urge incontinence    Following up with her urologist Next appt is 06/09/2022 Encouraged to continue taking  gemtesa       Anxiety    Refill lexapro Symptom is well controlled       Relevant Medications   escitalopram (LEXAPRO)  20 MG tablet   traZODone (DESYREL) 50 MG tablet   HSV-2 (herpes simplex virus 2) infection    No recent outbreaks Refilled acyclovir       Relevant Medications   acyclovir (ZOVIRAX) 400 MG tablet   Insomnia  Refilled trazodone       Relevant Medications   traZODone (DESYREL) 50 MG tablet   Other Visit Diagnoses     Colon cancer screening       Relevant Orders   Ambulatory referral to Gastroenterology   Need for shingles vaccine       Relevant Orders   Varicella-zoster vaccine IM (Shingrix) (Completed)   Other urinary incontinence       At risk for loss of bone density       Relevant Orders   DG Bone Density   Need for hepatitis C screening test       Relevant Orders   Hepatitis C Antibody   Vitamin D deficiency       Relevant Orders   Vitamin D (25 hydroxy)   IFG (impaired fasting glucose)       Relevant Orders   Hemoglobin A1c   Anxiety and depression       Relevant Medications   escitalopram (LEXAPRO) 20 MG tablet   traZODone (DESYREL) 50 MG tablet   Other Relevant Orders   Ambulatory referral to Endocrinology   Need for Tdap vaccination       Relevant Orders   Tdap vaccine greater than or equal to 7yo IM (Completed)   Alzheimer disease ([place])       Relevant Medications   donepezil (ARICEPT) 10 MG tablet   escitalopram (LEXAPRO) 20 MG tablet   traZODone (DESYREL) 50 MG tablet   HSV-2 infection       Relevant Medications   acyclovir (ZOVIRAX) 400 MG tablet   Other hyperlipidemia       Relevant Medications   atorvastatin (LIPITOR) 20 MG tablet   prazosin (MINIPRESS) 2 MG capsule   EPINEPHrine 0.3 mg/0.3 mL IJ SOAJ injection   Allergy to bee sting       Relevant Medications   EPINEPHrine 0.3 mg/0.3 mL IJ SOAJ injection   Nightmares       Relevant Medications   escitalopram (LEXAPRO) 20 MG tablet   prazosin (MINIPRESS) 2 MG capsule   traZODone (DESYREL) 50 MG tablet       Outpatient Encounter Medications as of 05/22/2022  Medication Sig   Apple  Cider Vinegar 300 MG TABS Take by mouth.   aspirin 81 MG chewable tablet Chew 1 tablet (81 mg total) by mouth 2 (two) times daily. (Patient taking differently: Chew 81 mg by mouth daily.)   cholecalciferol (VITAMIN D3) 25 MCG (1000 UNIT) tablet Take 1,000 Units by mouth daily.   clonazePAM (KLONOPIN) 0.5 MG tablet Take 0.5 mg by mouth at bedtime.   EPINEPHrine 0.3 mg/0.3 mL IJ SOAJ injection Inject 0.3 mg into the muscle as needed for anaphylaxis.   Insulin Glargine (LANTUS SOLOSTAR) 100 UNIT/ML Solostar Pen Inject 50 Units into the skin 2 (two) times daily. Inject 50 units subcutaneously with breakfast & 40 units subcutaneously after supper   insulin lispro (HUMALOG) 100 UNIT/ML KwikPen Inject into the skin.   loperamide (IMODIUM) 2 MG capsule Take 2 mg by mouth See admin instructions. Take 2 mg PO after each loose stool as needed for diarrhea (max of 3 doses in 24 hours)   nystatin cream (MYCOSTATIN) Apply 1 application topically See admin instructions. Apply to skin folds, under breasts, abdominal folds, and inguinal folds twice a day for candidasis   ondansetron (ZOFRAN) 4 MG tablet Take 4 mg by mouth every 6 (six) hours as needed.   Specialty Vitamins Products (COLLAGEN ULTRA PO) Take by mouth.  Vibegron (GEMTESA) 75 MG TABS Take 75 mg by mouth daily.   vitamin B-12 (CYANOCOBALAMIN) 1000 MCG tablet Take 1,000 mcg by mouth daily.   Vitamin D, Ergocalciferol, (DRISDOL) 1.25 MG (50000 UNIT) CAPS capsule Take 50,000 Units by mouth every 7 (seven) days.   [DISCONTINUED] acyclovir (ZOVIRAX) 400 MG tablet Take 400 mg by mouth every 12 (twelve) hours. Breakfast & bedtime   [DISCONTINUED] atorvastatin (LIPITOR) 20 MG tablet Take 20 mg by mouth at bedtime.   [DISCONTINUED] donepezil (ARICEPT) 10 MG tablet Take 10 mg by mouth daily.   [DISCONTINUED] escitalopram (LEXAPRO) 20 MG tablet Take 1 tablet by mouth daily.   [DISCONTINUED] glucose blood (TRUE METRIX BLOOD GLUCOSE TEST) test strip 1 each by Other  route as needed for other. Use as instructed   [DISCONTINUED] Insulin Pen Needle (BD AUTOSHIELD DUO) 30G X 5 MM MISC by Does not apply route.   [DISCONTINUED] levothyroxine (SYNTHROID) 112 MCG tablet Take 112 mcg by mouth daily.   [DISCONTINUED] prazosin (MINIPRESS) 2 MG capsule Take 2 mg by mouth at bedtime.   [DISCONTINUED] traZODone (DESYREL) 50 MG tablet Take 100 mg by mouth at bedtime.   acyclovir (ZOVIRAX) 400 MG tablet Take 1 tablet (400 mg total) by mouth every 12 (twelve) hours. Breakfast & bedtime   atorvastatin (LIPITOR) 20 MG tablet Take 1 tablet (20 mg total) by mouth at bedtime.   donepezil (ARICEPT) 10 MG tablet Take 1 tablet (10 mg total) by mouth daily.   escitalopram (LEXAPRO) 20 MG tablet Take 1 tablet (20 mg total) by mouth daily.   glucose blood (TRUE METRIX BLOOD GLUCOSE TEST) test strip 1 each by Other route as needed for other. Use as instructed   Insulin Pen Needle (BD AUTOSHIELD DUO) 30G X 5 MM MISC Follow instructions provided   levothyroxine (SYNTHROID) 112 MCG tablet Take 1 tablet (112 mcg total) by mouth daily.   prazosin (MINIPRESS) 2 MG capsule Take 1 capsule (2 mg total) by mouth at bedtime.   traZODone (DESYREL) 50 MG tablet Take 2 tablets (100 mg total) by mouth at bedtime.   No facility-administered encounter medications on file as of 05/22/2022.    Follow-up: Return in about 3 months (around 08/22/2022).   Alvira Monday, FNP

## 2022-05-22 NOTE — Assessment & Plan Note (Signed)
No complaints or concerns today Pending labs Refilled Synthroid

## 2022-05-22 NOTE — Assessment & Plan Note (Signed)
Refilled aricept and encouraged to continue therapy

## 2022-05-22 NOTE — Assessment & Plan Note (Signed)
Refill lexapro Symptom is well controlled

## 2022-05-22 NOTE — Assessment & Plan Note (Signed)
Refilled trazodone.

## 2022-05-22 NOTE — Assessment & Plan Note (Signed)
No recent outbreaks Refilled acyclovir

## 2022-05-22 NOTE — Assessment & Plan Note (Addendum)
-  refills insulin pen needles and test strips -pending HgA1c - will refer to the endocrinologist for management of the patient's insulin -reports taking insulin for about 20 years

## 2022-05-22 NOTE — Patient Instructions (Addendum)
I appreciate the opportunity to provide care to you today!    Follow up:  3 months  Labs: please stop by the lab today to get your blood drawn (CBC, CMP, TSH, Lipid profile, HgA1c, Vit D)  Screening: Hep C  Please pick up your medications at the pharmacy  Thank you for getting your Tdap and Shingles vaccine  Referrals today- endocrinologist      Please continue to a heart-healthy diet and increase your physical activities. Try to exercise for 34mns at least three times a week.      It was a pleasure to see you and I look forward to continuing to work together on your health and well-being. Please do not hesitate to call the office if you need care or have questions about your care.   Have a wonderful day and week. With Gratitude, GAlvira MondayMSN, FNP-BC

## 2022-05-22 NOTE — Assessment & Plan Note (Addendum)
Following up with her urologist Next appt is 06/09/2022 Encouraged to continue taking  gemtesa

## 2022-05-23 ENCOUNTER — Telehealth: Payer: Self-pay | Admitting: Family Medicine

## 2022-05-23 ENCOUNTER — Other Ambulatory Visit: Payer: Self-pay | Admitting: Family Medicine

## 2022-05-23 LAB — HEMOGLOBIN A1C
Est. average glucose Bld gHb Est-mCnc: 174 mg/dL
Hgb A1c MFr Bld: 7.7 % — ABNORMAL HIGH (ref 4.8–5.6)

## 2022-05-23 LAB — CMP14+EGFR
ALT: 37 IU/L — ABNORMAL HIGH (ref 0–32)
AST: 34 IU/L (ref 0–40)
Albumin/Globulin Ratio: 1.6 (ref 1.2–2.2)
Albumin: 4.1 g/dL (ref 3.7–4.7)
Alkaline Phosphatase: 49 IU/L (ref 44–121)
BUN/Creatinine Ratio: 17 (ref 12–28)
BUN: 12 mg/dL (ref 8–27)
Bilirubin Total: 0.5 mg/dL (ref 0.0–1.2)
CO2: 25 mmol/L (ref 20–29)
Calcium: 9.4 mg/dL (ref 8.7–10.3)
Chloride: 102 mmol/L (ref 96–106)
Creatinine, Ser: 0.7 mg/dL (ref 0.57–1.00)
Globulin, Total: 2.6 g/dL (ref 1.5–4.5)
Glucose: 103 mg/dL — ABNORMAL HIGH (ref 70–99)
Potassium: 4.3 mmol/L (ref 3.5–5.2)
Sodium: 143 mmol/L (ref 134–144)
Total Protein: 6.7 g/dL (ref 6.0–8.5)
eGFR: 92 mL/min/{1.73_m2} (ref >59)

## 2022-05-23 LAB — TSH+FREE T4
Free T4: 1.52 ng/dL (ref 0.82–1.77)
TSH: 0.403 u[IU]/mL — ABNORMAL LOW (ref 0.450–4.500)

## 2022-05-23 LAB — CBC WITH DIFFERENTIAL/PLATELET
Basophils Absolute: 0 10*3/uL (ref 0.0–0.2)
Basos: 1 %
EOS (ABSOLUTE): 0.1 10*3/uL (ref 0.0–0.4)
Eos: 1 %
Hematocrit: 35.8 % (ref 34.0–46.6)
Hemoglobin: 12 g/dL (ref 11.1–15.9)
Immature Grans (Abs): 0 10*3/uL (ref 0.0–0.1)
Immature Granulocytes: 0 %
Lymphocytes Absolute: 1.5 10*3/uL (ref 0.7–3.1)
Lymphs: 28 %
MCH: 27.8 pg (ref 26.6–33.0)
MCHC: 33.5 g/dL (ref 31.5–35.7)
MCV: 83 fL (ref 79–97)
Monocytes Absolute: 0.7 10*3/uL (ref 0.1–0.9)
Monocytes: 12 %
Neutrophils Absolute: 3.1 10*3/uL (ref 1.4–7.0)
Neutrophils: 58 %
Platelets: 232 10*3/uL (ref 150–450)
RBC: 4.32 x10E6/uL (ref 3.77–5.28)
RDW: 13.6 % (ref 11.7–15.4)
WBC: 5.3 10*3/uL (ref 3.4–10.8)

## 2022-05-23 LAB — HEPATITIS C ANTIBODY: Hep C Virus Ab: NONREACTIVE

## 2022-05-23 LAB — LIPID PANEL
Chol/HDL Ratio: 2.9 ratio (ref 0.0–4.4)
Cholesterol, Total: 130 mg/dL (ref 100–199)
HDL: 45 mg/dL (ref >39)
LDL Chol Calc (NIH): 68 mg/dL (ref 0–99)
Triglycerides: 89 mg/dL (ref 0–149)
VLDL Cholesterol Cal: 17 mg/dL (ref 5–40)

## 2022-05-23 LAB — VITAMIN D 25 HYDROXY (VIT D DEFICIENCY, FRACTURES): Vit D, 25-Hydroxy: 48.4 ng/mL (ref 30.0–100.0)

## 2022-05-23 NOTE — Telephone Encounter (Signed)
Please advice  

## 2022-05-23 NOTE — Telephone Encounter (Signed)
Could you please refill her medications

## 2022-05-23 NOTE — Telephone Encounter (Signed)
Pt called stating all her medication that were called in yesterday were sent to the wrong phar. Can you please resend all meds to [facility]?      [employer]

## 2022-05-24 ENCOUNTER — Other Ambulatory Visit: Payer: Self-pay

## 2022-05-24 DIAGNOSIS — F515 Nightmare disorder: Secondary | ICD-10-CM

## 2022-05-24 DIAGNOSIS — G4709 Other insomnia: Secondary | ICD-10-CM

## 2022-05-24 DIAGNOSIS — B009 Herpesviral infection, unspecified: Secondary | ICD-10-CM

## 2022-05-24 DIAGNOSIS — E039 Hypothyroidism, unspecified: Secondary | ICD-10-CM

## 2022-05-24 DIAGNOSIS — Z91030 Bee allergy status: Secondary | ICD-10-CM

## 2022-05-24 DIAGNOSIS — E7849 Other hyperlipidemia: Secondary | ICD-10-CM

## 2022-05-24 DIAGNOSIS — E1100 Type 2 diabetes mellitus with hyperosmolarity without nonketotic hyperglycemic-hyperosmolar coma (NKHHC): Secondary | ICD-10-CM

## 2022-05-24 DIAGNOSIS — F419 Anxiety disorder, unspecified: Secondary | ICD-10-CM

## 2022-05-24 DIAGNOSIS — F03A4 Unspecified dementia, mild, with anxiety: Secondary | ICD-10-CM

## 2022-05-24 DIAGNOSIS — F32A Depression, unspecified: Secondary | ICD-10-CM

## 2022-05-24 MED ORDER — LEVOTHYROXINE SODIUM 112 MCG PO TABS: 112.0000 ug | ORAL_TABLET | Freq: Every day | ORAL | 1 refills | Status: DC

## 2022-05-24 MED ORDER — TRAZODONE HCL 50 MG PO TABS: 100.0000 mg | ORAL_TABLET | Freq: Every day | ORAL | 2 refills | Status: DC

## 2022-05-24 MED ORDER — CLONAZEPAM 0.5 MG PO TABS: 0.5000 mg | ORAL_TABLET | Freq: Every day | ORAL | 0 refills | Status: DC

## 2022-05-24 MED ORDER — ATORVASTATIN CALCIUM 20 MG PO TABS: 20.0000 mg | ORAL_TABLET | Freq: Every day | ORAL | 1 refills | Status: DC

## 2022-05-24 MED ORDER — ACYCLOVIR 400 MG PO TABS: 400.0000 mg | ORAL_TABLET | Freq: Two times a day (BID) | ORAL | 1 refills | Status: DC

## 2022-05-24 MED ORDER — TRUE METRIX BLOOD GLUCOSE TEST VI STRP: 1.0000 | ORAL_STRIP | 2 refills | Status: DC | PRN

## 2022-05-24 MED ORDER — EPINEPHRINE 0.3 MG/0.3ML IJ SOAJ: 0.3000 mg | INTRAMUSCULAR | 2 refills | Status: AC | PRN

## 2022-05-24 MED ORDER — PRAZOSIN HCL 2 MG PO CAPS: 2.0000 mg | ORAL_CAPSULE | Freq: Every day | ORAL | 1 refills | Status: DC

## 2022-05-24 MED ORDER — BD AUTOSHIELD DUO 30G X 5 MM MISC: 2 refills | Status: DC

## 2022-05-24 MED ORDER — DONEPEZIL HCL 10 MG PO TABS: 10.0000 mg | ORAL_TABLET | Freq: Every day | ORAL | 1 refills | Status: DC

## 2022-05-24 MED ORDER — ESCITALOPRAM OXALATE 20 MG PO TABS: 20.0000 mg | ORAL_TABLET | Freq: Every day | ORAL | 2 refills | Status: DC

## 2022-05-24 NOTE — Progress Notes (Signed)
Refills were sent to [facility] as they were sent to wrong pharmacy at visit.

## 2022-05-24 NOTE — Progress Notes (Signed)
Please inform the patient that her labs have resulted.  Her TSH is slightly low but not concerning, continue taking Synthroid, and we will assess her labs at her next visit.  Her HgA1c is 7.7. Advise her to continue taking her  insulin as prescribed

## 2022-05-24 NOTE — Telephone Encounter (Signed)
Refills sent

## 2022-05-25 ENCOUNTER — Other Ambulatory Visit: Payer: Self-pay | Admitting: Family Medicine

## 2022-05-25 DIAGNOSIS — E1100 Type 2 diabetes mellitus with hyperosmolarity without nonketotic hyperglycemic-hyperosmolar coma (NKHHC): Secondary | ICD-10-CM

## 2022-05-25 DIAGNOSIS — Z794 Long term (current) use of insulin: Secondary | ICD-10-CM

## 2022-05-25 MED ORDER — TRUE METRIX BLOOD GLUCOSE TEST VI STRP: ORAL_STRIP | 2 refills | Status: DC

## 2022-05-25 MED ORDER — BD AUTOSHIELD DUO 30G X 5 MM MISC: 2 refills | Status: DC

## 2022-06-01 ENCOUNTER — Other Ambulatory Visit (HOSPITAL_COMMUNITY): Payer: BC Managed Care – PPO

## 2022-06-07 ENCOUNTER — Ambulatory Visit (INDEPENDENT_AMBULATORY_CARE_PROVIDER_SITE_OTHER): Payer: 59 | Admitting: Physician Assistant

## 2022-06-07 DIAGNOSIS — N3941 Urge incontinence: Secondary | ICD-10-CM | POA: Diagnosis not present

## 2022-06-07 NOTE — Progress Notes (Signed)
PTNS  Session # 1 of 12  Health & Social Factors: diabetes Caffeine: none Alcohol: none Daytime voids #per day: 10-15 Night-time voids #per night: 3-5 Urgency: yes Incontinence Episodes #per day: everytime Ankle used: left Treatment Setting: 5 Feeling/ Response: Feeling in ankle Comments: n/a  Performed By: Charleston Poot  Follow Up: Follow up as scheduled.

## 2022-06-14 ENCOUNTER — Ambulatory Visit (INDEPENDENT_AMBULATORY_CARE_PROVIDER_SITE_OTHER): Payer: 59 | Admitting: Physician Assistant

## 2022-06-14 DIAGNOSIS — N3941 Urge incontinence: Secondary | ICD-10-CM

## 2022-06-14 NOTE — Progress Notes (Signed)
PTNS  Session # 2 of 12  Health & Social Factors: Diabtetes Caffeine: no Alcohol: no Daytime voids #per day: 10-15 Night-time voids #per night: all night Urgency: yes Incontinence Episodes #per day: no Ankle used: right Treatment Setting: 4 Feeling/ Response: tingling in leg and foot Comments: n/a  Performed By: Levi Aland, CMA  Follow Up: follow up in 1 week

## 2022-06-15 ENCOUNTER — Other Ambulatory Visit: Payer: Self-pay

## 2022-06-15 DIAGNOSIS — E1100 Type 2 diabetes mellitus with hyperosmolarity without nonketotic hyperglycemic-hyperosmolar coma (NKHHC): Secondary | ICD-10-CM

## 2022-06-15 MED ORDER — LANTUS SOLOSTAR 100 UNIT/ML ~~LOC~~ SOPN: 50.0000 [IU] | PEN_INJECTOR | Freq: Two times a day (BID) | SUBCUTANEOUS | 2 refills | Status: DC

## 2022-06-18 ENCOUNTER — Ambulatory Visit (INDEPENDENT_AMBULATORY_CARE_PROVIDER_SITE_OTHER): Payer: 59

## 2022-06-18 DIAGNOSIS — Z0000 Encounter for general adult medical examination without abnormal findings: Secondary | ICD-10-CM

## 2022-06-21 ENCOUNTER — Encounter: Payer: Self-pay | Admitting: Family Medicine

## 2022-06-21 ENCOUNTER — Ambulatory Visit (INDEPENDENT_AMBULATORY_CARE_PROVIDER_SITE_OTHER): Payer: 59 | Admitting: Physician Assistant

## 2022-06-21 ENCOUNTER — Ambulatory Visit (INDEPENDENT_AMBULATORY_CARE_PROVIDER_SITE_OTHER): Payer: 59 | Admitting: Family Medicine

## 2022-06-21 VITALS — BP 117/73 | HR 70 | Ht 66.5 in | Wt 217.0 lb

## 2022-06-21 DIAGNOSIS — B379 Candidiasis, unspecified: Secondary | ICD-10-CM

## 2022-06-21 DIAGNOSIS — Z1211 Encounter for screening for malignant neoplasm of colon: Secondary | ICD-10-CM | POA: Diagnosis not present

## 2022-06-21 DIAGNOSIS — N3941 Urge incontinence: Secondary | ICD-10-CM | POA: Diagnosis not present

## 2022-06-21 DIAGNOSIS — B354 Tinea corporis: Secondary | ICD-10-CM | POA: Insufficient documentation

## 2022-06-21 MED ORDER — NYSTATIN 100000 UNIT/GM EX CREA: 1.0000 | TOPICAL_CREAM | CUTANEOUS | 1 refills | Status: DC

## 2022-06-21 NOTE — Progress Notes (Deleted)
Established Patient Office Visit  Subjective:  Patient ID: [name], female    DOB: 24-Aug-1951  Age: 71 y.o. MRN: [id number]  CC:  Chief Complaint  Patient presents with   Rash    Pt c/o rash under her belly, rash is red, has odor, and is painful, onset of rash has been ongoing on and off.     HPI [name] is a 71 y.o. female with past medical history of *** presents for f/u of *** chronic medical conditions.  Past Medical History:  Diagnosis Date   Anemia    as a teenager   Anxiety    Arthritis    knees, rt hand,hips   Cancer (HCC)    vaginal,uterine,ovariam   COPD (chronic obstructive pulmonary disease) (HCC)    Depression    Diabetes mellitus without complication (HCC)    Hypothyroidism    Neuromuscular disorder ([place])    hands and feet    Past Surgical History:  Procedure Laterality Date   ABDOMINAL HYSTERECTOMY     BREAST SURGERY Right 1988   lumpectomy   EYE SURGERY Bilateral    FRACTURE SURGERY     Rt ankle,lt knee cap,lt shoulder,rt wrist,rt shoulder,   TONSILLECTOMY     at age 65   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   Procedure: TOTAL KNEE ARTHROPLASTY;  Surgeon: Earlie Server, MD;  Location: [facility];  Service: Orthopedics;  Laterality: Right;    History reviewed. No pertinent family history.  Social History   Socioeconomic History   Marital status: Married    Spouse name: Not on file   Number of children: Not on file   Years of education: Not on file   Highest education level: Not on file  Occupational History   Not on file  Tobacco Use   Smoking status: Former    Packs/day: 0.50    Years: 15.00    Total pack years: 7.50    Types: Cigarettes    Quit date: 2013    Years since quitting: 10.4   Smokeless tobacco: Never  Vaping Use   Vaping Use: Never used  Substance and Sexual Activity   Alcohol use: Never   Drug use: Never   Sexual activity: Not on file  Other Topics Concern   Not on file  Social History  Narrative   Not on file   Social Determinants of Health   Financial Resource Strain: Not on file  Food Insecurity: Not on file  Transportation Needs: Not on file  Physical Activity: Not on file  Stress: Not on file  Social Connections: Not on file  Intimate Partner Violence: Not on file    Outpatient Medications Prior to Visit  Medication Sig Dispense Refill   acyclovir (ZOVIRAX) 400 MG tablet Take 1 tablet (400 mg total) by mouth every 12 (twelve) hours. Breakfast & bedtime 90 tablet 1   Apple Cider Vinegar 300 MG TABS Take by mouth.     aspirin 81 MG chewable tablet Chew 1 tablet (81 mg total) by mouth 2 (two) times daily. (Patient taking differently: Chew 81 mg by mouth daily.) 60 tablet 0   atorvastatin (LIPITOR) 20 MG tablet Take 1 tablet (20 mg total) by mouth at bedtime. 90 tablet 1   cholecalciferol (VITAMIN D3) 25 MCG (1000 UNIT) tablet Take 1,000 Units by mouth daily.     clonazePAM (KLONOPIN) 0.5 MG tablet Take 1 tablet (0.5 mg total) by mouth at bedtime. 30 tablet 0   donepezil (ARICEPT)  10 MG tablet Take 1 tablet (10 mg total) by mouth daily. 90 tablet 1   EPINEPHrine 0.3 mg/0.3 mL IJ SOAJ injection Inject 0.3 mg into the muscle as needed for anaphylaxis. 1 each 2   escitalopram (LEXAPRO) 20 MG tablet Take 1 tablet (20 mg total) by mouth daily. 30 tablet 2   glucose blood (TRUE METRIX BLOOD GLUCOSE TEST) test strip Use 4 times daily as instructed 100 each 2   insulin glargine (LANTUS SOLOSTAR) 100 UNIT/ML Solostar Pen Inject 50 Units into the skin 2 (two) times daily. Inject 50 units subcutaneously with breakfast & 40 units subcutaneously after supper 15 mL 2   insulin lispro (HUMALOG) 100 UNIT/ML KwikPen Inject into the skin.     Insulin Pen Needle (BD AUTOSHIELD DUO) 30G X 5 MM MISC Use 4 times daily as instructed 100 each 2   levothyroxine (SYNTHROID) 112 MCG tablet Take 1 tablet (112 mcg total) by mouth daily. 90 tablet 1   loperamide (IMODIUM) 2 MG capsule Take 2 mg by  mouth See admin instructions. Take 2 mg PO after each loose stool as needed for diarrhea (max of 3 doses in 24 hours)     nystatin cream (MYCOSTATIN) Apply 1 application  topically See admin instructions. Apply to skin folds, under breasts, abdominal folds, and inguinal folds twice a day for candidasis     ondansetron (ZOFRAN) 4 MG tablet Take 4 mg by mouth every 6 (six) hours as needed.     prazosin (MINIPRESS) 2 MG capsule Take 1 capsule (2 mg total) by mouth at bedtime. 90 capsule 1   Specialty Vitamins Products (COLLAGEN ULTRA PO) Take by mouth.     traZODone (DESYREL) 50 MG tablet Take 2 tablets (100 mg total) by mouth at bedtime. 30 tablet 2   Vibegron (GEMTESA) 75 MG TABS Take 75 mg by mouth daily. 28 tablet 0   vitamin B-12 (CYANOCOBALAMIN) 1000 MCG tablet Take 1,000 mcg by mouth daily.     Vitamin D, Ergocalciferol, (DRISDOL) 1.25 MG (50000 UNIT) CAPS capsule Take 50,000 Units by mouth every 7 (seven) days.     No facility-administered medications prior to visit.    Allergies  Allergen Reactions   Ambien [Zolpidem Tartrate] Other (See Comments)    Hallucinations/nightmares (tried to jump out of a window)   Bee Venom Swelling   Coconut Flavor Swelling    Mouth swells   Codeine Nausea And Vomiting   Onion Nausea Only and Other (See Comments)    Raw onions   Other     Ground Beef (patient avoid due to side effect of diarrhea)   Sulfa Antibiotics Rash and Other (See Comments)    Including topical sulfa  (mouth dries/peels/skin blisters)    ROS Review of Systems    Objective:    Physical Exam  BP 117/73   Pulse 70   Ht 5' 6.5" (1.689 m)   Wt 217 lb (98.4 kg)   SpO2 95%   BMI 34.50 kg/m  Wt Readings from Last 3 Encounters:  06/21/22 217 lb (98.4 kg)  05/22/22 219 lb 9.6 oz (99.6 kg)  02/08/22 222 lb (100.7 kg)    Lab Results  Component Value Date   TSH 0.403 (L) 05/22/2022   Lab Results  Component Value Date   WBC 5.3 05/22/2022   HGB 12.0 05/22/2022    HCT 35.8 05/22/2022   MCV 83 05/22/2022   PLT 232 05/22/2022   Lab Results  Component Value Date   NA  143 05/22/2022   K 4.3 05/22/2022   CO2 25 05/22/2022   GLUCOSE 103 (H) 05/22/2022   BUN 12 05/22/2022   CREATININE 0.70 05/22/2022   BILITOT 0.5 05/22/2022   ALKPHOS 49 05/22/2022   AST 34 05/22/2022   ALT 37 (H) 05/22/2022   PROT 6.7 05/22/2022   ALBUMIN 4.1 05/22/2022   CALCIUM 9.4 05/22/2022   ANIONGAP 8 04/25/2021   EGFR 92 05/22/2022   Lab Results  Component Value Date   CHOL 130 05/22/2022   Lab Results  Component Value Date   HDL 45 05/22/2022   Lab Results  Component Value Date   LDLCALC 68 05/22/2022   Lab Results  Component Value Date   TRIG 89 05/22/2022   Lab Results  Component Value Date   CHOLHDL 2.9 05/22/2022   Lab Results  Component Value Date   HGBA1C 7.7 (H) 05/22/2022      Assessment & Plan:   Problem List Items Addressed This Visit   None Visit Diagnoses     Colon cancer screening    -  Primary   Relevant Orders   Cologuard       No orders of the defined types were placed in this encounter.   Follow-up: No follow-ups on file.    Alvira Monday, FNP

## 2022-06-21 NOTE — Assessment & Plan Note (Signed)
Assessment findings consistent with fungal infection Will treat with Nystatin

## 2022-06-21 NOTE — Progress Notes (Signed)
   Acute Office Visit  Subjective:     Patient ID: [name], female    DOB: 1951/11/10, 71 y.o.   MRN: [id number]  Chief Complaint  Patient presents with   Rash    Pt c/o rash under her belly, rash is red, has odor, and is painful, onset of rash has been ongoing on and off.     Rash This is a recurrent problem. The current episode started in the past 7 days. The problem is unchanged. The affected locations include the abdomen. The rash is characterized by burning, itchiness, draining and redness. She was exposed to nothing. Pertinent negatives include no fatigue, fever, rhinorrhea or vomiting. Past treatments include nothing. The treatment provided no relief. There is no history of asthma or varicella.    Review of Systems  Constitutional:  Negative for chills, fatigue and fever.  HENT:  Negative for rhinorrhea.   Gastrointestinal:  Negative for nausea and vomiting.  Skin:  Positive for itching and rash.  Neurological:  Negative for headaches.        Objective:    BP 117/73   Pulse 70   Ht 5' 6.5" (1.689 m)   Wt 217 lb (98.4 kg)   SpO2 95%   BMI 34.50 kg/m    Physical Exam HENT:     Head: Normocephalic.  Cardiovascular:     Rate and Rhythm: Normal rate and regular rhythm.     Pulses: Normal pulses.     Heart sounds: Normal heart sounds.  Skin:    Findings: Erythema and rash (erythematous, pruritic rash in the folds of the abdomen) present.  Neurological:     Mental Status: She is alert.     No results found for any visits on 06/21/22.      Assessment & Plan:   Problem List Items Addressed This Visit       Musculoskeletal and Integument   Tinea corporis    Assessment findings consistent with fungal infection Will treat with Nystatin      Relevant Medications   nystatin cream (MYCOSTATIN)   Other Visit Diagnoses     Colon cancer screening    -  Primary   Relevant Orders   Cologuard   Candida infection       Relevant Medications    nystatin cream (MYCOSTATIN)       Meds ordered this encounter  Medications   nystatin cream (MYCOSTATIN)    Sig: Apply 1 Application topically See admin instructions. Apply to skin folds, under breasts, abdominal folds, and inguinal folds twice a day for candidasis    Dispense:  30 g    Refill:  1    Return if symptoms worsen or fail to improve.  Alvira Monday, FNP

## 2022-06-21 NOTE — Progress Notes (Signed)
PTNS  Session # 3 of 12  Health & Social Factors: diabetes Caffeine: none Alcohol: none Daytime voids #per day: 12-15 Night-time voids #per night: all night Urgency: yes Incontinence Episodes #per day: always Ankle used: left Treatment Setting: 5 Feeling/ Response: strong heel sensation Comments:   Performed By: Estill Bamberg RN  Follow Up: weekly PTNS

## 2022-06-21 NOTE — Patient Instructions (Addendum)
I appreciate the opportunity to provide care to you today!    Follow up:  3 months  Please pick up your nystatin cream from the pharmacy   Please continue to a heart-healthy diet and increase your physical activities. Try to exercise for 65mns at least three times a week.      It was a pleasure to see you and I look forward to continuing to work together on your health and well-being. Please do not hesitate to call the office if you need care or have questions about your care.   Have a wonderful day and week. With Gratitude, GAlvira MondayMSN, FNP-BC

## 2022-06-28 ENCOUNTER — Ambulatory Visit (INDEPENDENT_AMBULATORY_CARE_PROVIDER_SITE_OTHER): Payer: 59 | Admitting: Nurse Practitioner

## 2022-06-28 ENCOUNTER — Encounter: Payer: Self-pay | Admitting: Nurse Practitioner

## 2022-06-28 ENCOUNTER — Ambulatory Visit (INDEPENDENT_AMBULATORY_CARE_PROVIDER_SITE_OTHER): Payer: 59 | Admitting: Physician Assistant

## 2022-06-28 VITALS — BP 144/82 | HR 59 | Ht 66.5 in | Wt 220.0 lb

## 2022-06-28 DIAGNOSIS — N3941 Urge incontinence: Secondary | ICD-10-CM | POA: Diagnosis not present

## 2022-06-28 DIAGNOSIS — E039 Hypothyroidism, unspecified: Secondary | ICD-10-CM

## 2022-06-28 DIAGNOSIS — Z794 Long term (current) use of insulin: Secondary | ICD-10-CM | POA: Diagnosis not present

## 2022-06-28 DIAGNOSIS — E119 Type 2 diabetes mellitus without complications: Secondary | ICD-10-CM

## 2022-06-28 DIAGNOSIS — E1100 Type 2 diabetes mellitus with hyperosmolarity without nonketotic hyperglycemic-hyperosmolar coma (NKHHC): Secondary | ICD-10-CM | POA: Diagnosis not present

## 2022-06-28 MED ORDER — LANTUS SOLOSTAR 100 UNIT/ML ~~LOC~~ SOPN: 25.0000 [IU] | PEN_INJECTOR | Freq: Two times a day (BID) | SUBCUTANEOUS | 2 refills | Status: DC

## 2022-06-28 NOTE — Progress Notes (Signed)
Endocrinology Consult Note       06/28/2022, 3:35 PM   Subjective:    Patient ID: [name], female    DOB: 07-Jun-1951.  [name] is being seen in consultation for management of currently uncontrolled symptomatic diabetes requested by  Alvira Monday, [place].   Past Medical History:  Diagnosis Date   Anemia    as a teenager   Anxiety    Arthritis    knees, rt hand,hips   Cancer (HCC)    vaginal,uterine,ovariam   COPD (chronic obstructive pulmonary disease) (HCC)    Depression    Diabetes mellitus without complication (HCC)    Hypothyroidism    Neuromuscular disorder ([place])    hands and feet    Past Surgical History:  Procedure Laterality Date   ABDOMINAL HYSTERECTOMY     BREAST SURGERY Right 1988   lumpectomy   EYE SURGERY Bilateral    FRACTURE SURGERY     Rt ankle,lt knee cap,lt shoulder,rt wrist,rt shoulder,   TONSILLECTOMY     at age 26   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   Procedure: TOTAL KNEE ARTHROPLASTY;  Surgeon: Earlie Server, MD;  Location: [facility];  Service: Orthopedics;  Laterality: Right;    Social History   Socioeconomic History   Marital status: Married    Spouse name: Not on file   Number of children: Not on file   Years of education: Not on file   Highest education level: Not on file  Occupational History   Not on file  Tobacco Use   Smoking status: Former    Packs/day: 0.50    Years: 15.00    Total pack years: 7.50    Types: Cigarettes    Quit date: 2013    Years since quitting: 10.5   Smokeless tobacco: Never  Vaping Use   Vaping Use: Never used  Substance and Sexual Activity   Alcohol use: Never   Drug use: Never   Sexual activity: Not on file  Other Topics Concern   Not on file  Social History Narrative   Not on file   Social Determinants of Health   Financial Resource Strain: Not on file  Food Insecurity: Not on file   Transportation Needs: Not on file  Physical Activity: Not on file  Stress: Not on file  Social Connections: Not on file    Family History  Problem Relation Age of Onset   Cancer Mother    Hypertension Mother    Thyroid disease Mother    Diabetes Mother    Stroke Mother    Heart failure Mother    Diabetes Father    Heart attack Father    Diabetes Sister    Diabetes Brother     Outpatient Encounter Medications as of 06/28/2022  Medication Sig   acyclovir (ZOVIRAX) 400 MG tablet Take 1 tablet (400 mg total) by mouth every 12 (twelve) hours. Breakfast & bedtime   Apple Cider Vinegar 300 MG TABS Take by mouth.   aspirin 81 MG chewable tablet Chew 1 tablet (81 mg total) by mouth 2 (two) times daily. (Patient taking differently: Chew 81 mg by mouth daily.)   atorvastatin (LIPITOR) 20 MG  tablet Take 1 tablet (20 mg total) by mouth at bedtime.   cholecalciferol (VITAMIN D3) 25 MCG (1000 UNIT) tablet Take 1,000 Units by mouth daily.   clonazePAM (KLONOPIN) 0.5 MG tablet Take 1 tablet (0.5 mg total) by mouth at bedtime.   donepezil (ARICEPT) 10 MG tablet Take 1 tablet (10 mg total) by mouth daily.   EPINEPHrine 0.3 mg/0.3 mL IJ SOAJ injection Inject 0.3 mg into the muscle as needed for anaphylaxis.   escitalopram (LEXAPRO) 20 MG tablet Take 1 tablet (20 mg total) by mouth daily.   glucose blood (TRUE METRIX BLOOD GLUCOSE TEST) test strip Use 4 times daily as instructed   insulin glargine (LANTUS SOLOSTAR) 100 UNIT/ML Solostar Pen Inject 25 Units into the skin 2 (two) times daily. Inject 50 units subcutaneously with breakfast & 40 units subcutaneously after supper   insulin lispro (HUMALOG) 100 UNIT/ML KwikPen Inject 5-8 Units into the skin 3 (three) times daily.   Insulin Pen Needle (BD AUTOSHIELD DUO) 30G X 5 MM MISC Use 4 times daily as instructed   levothyroxine (SYNTHROID) 112 MCG tablet Take 1 tablet (112 mcg total) by mouth daily.   loperamide (IMODIUM) 2 MG capsule Take 2 mg by mouth  See admin instructions. Take 2 mg PO after each loose stool as needed for diarrhea (max of 3 doses in 24 hours)   nystatin cream (MYCOSTATIN) Apply 1 Application topically See admin instructions. Apply to skin folds, under breasts, abdominal folds, and inguinal folds twice a day for candidasis   ondansetron (ZOFRAN) 4 MG tablet Take 4 mg by mouth every 6 (six) hours as needed.   prazosin (MINIPRESS) 2 MG capsule Take 1 capsule (2 mg total) by mouth at bedtime.   Specialty Vitamins Products (COLLAGEN ULTRA PO) Take by mouth.   traZODone (DESYREL) 50 MG tablet Take 2 tablets (100 mg total) by mouth at bedtime.   Vibegron (GEMTESA) 75 MG TABS Take 75 mg by mouth daily.   vitamin B-12 (CYANOCOBALAMIN) 1000 MCG tablet Take 1,000 mcg by mouth daily.   [DISCONTINUED] insulin glargine (LANTUS SOLOSTAR) 100 UNIT/ML Solostar Pen Inject 50 Units into the skin 2 (two) times daily. Inject 50 units subcutaneously with breakfast & 40 units subcutaneously after supper (Patient taking differently: Inject 40-50 Units into the skin 2 (two) times daily. Inject 50 units subcutaneously with breakfast & 40 units subcutaneously after supper)   [DISCONTINUED] Vitamin D, Ergocalciferol, (DRISDOL) 1.25 MG (50000 UNIT) CAPS capsule Take 50,000 Units by mouth every 7 (seven) days.   No facility-administered encounter medications on file as of 06/28/2022.    ALLERGIES: Allergies  Allergen Reactions   Ambien [Zolpidem Tartrate] Other (See Comments)    Hallucinations/nightmares   Bee Venom Swelling   Coconut Flavor Swelling    Mouth swells   Codeine Nausea And Vomiting   Onion Nausea Only and Other (See Comments)    Raw onions   Other     Ground Beef (patient avoid due to side effect of diarrhea)   Sulfa Antibiotics Rash and Other (See Comments)    Including topical sulfa  (mouth dries/peels/skin blisters)    VACCINATION STATUS: Immunization History  Administered Date(s) Administered   Tdap 05/22/2022   Zoster  Recombinat (Shingrix) 05/22/2022    Diabetes She presents for her initial diabetic visit. She has type 2 diabetes mellitus. Onset time: Diagnosed at approx age of 66. Her disease course has been improving. There are no hypoglycemic associated symptoms. Associated symptoms include foot paresthesias. There are no hypoglycemic  complications. Symptoms are stable. There are no diabetic complications. Risk factors for coronary artery disease include diabetes mellitus, family history, obesity, sedentary lifestyle and post-menopausal. Current diabetic treatment includes intensive insulin program. She is compliant with treatment most of the time. Her weight is fluctuating minimally. She is following a generally healthy diet. Meal planning includes avoidance of concentrated sweets. She has not had a previous visit with a dietitian. She participates in exercise daily. Her overall blood glucose range is 110-130 mg/dl. (She presents today for her consultation with her meter showing mostly at goal glycemic profile.  Her most recent A1c on 5/30 was 7.7%.  She monitors glucose 4 times daily.  She drinks coffee with SF creamer and splenda, and flavored water.  She eats 2 meals per day and a snack.  She does engage in routine physical activity, taking care of her disabled husband.  She is UTD on eye exam, sees podiatry routinely.  Analysis of her meter shows 7-day average of 127, 14-day average of 140, 30-day average of 145.) An ACE inhibitor/angiotensin II receptor blocker is being taken. She sees a podiatrist.Eye exam is current.    Review of systems  Constitutional: + Minimally fluctuating body weight, current Body mass index is 34.98 kg/m., no fatigue, no subjective hyperthermia, no subjective hypothermia Eyes: no blurry vision, no xerophthalmia ENT: no sore throat, no nodules palpated in throat, no dysphagia/odynophagia, no hoarseness Cardiovascular: no chest pain, no shortness of breath, no palpitations, no leg  swelling Respiratory: no cough, no shortness of breath Gastrointestinal: no nausea/vomiting/diarrhea Musculoskeletal: no muscle/joint aches Skin: no rashes, no hyperemia Neurological: no tremors, + numbness/tingling to BLE and bilateral hands, no dizziness Psychiatric: no depression, no anxiety  Objective:     BP (!) 144/82   Pulse (!) 59   Ht 5' 6.5" (1.689 m)   Wt 220 lb (99.8 kg)   BMI 34.98 kg/m   Wt Readings from Last 3 Encounters:  06/28/22 220 lb (99.8 kg)  06/21/22 217 lb (98.4 kg)  05/22/22 219 lb 9.6 oz (99.6 kg)     BP Readings from Last 3 Encounters:  06/28/22 (!) 144/82  06/21/22 117/73  05/22/22 118/62     Physical Exam- Limited  Constitutional:  Body mass index is 34.98 kg/m. , not in acute distress, normal state of mind Eyes:  EOMI, no exophthalmos Neck: Supple Cardiovascular: RRR, no murmurs, rubs, or gallops, no edema Respiratory: Adequate breathing efforts, no crackles, rales, rhonchi, or wheezing Musculoskeletal: no gross deformities, strength intact in all four extremities, no gross restriction of joint movements Skin:  no rashes, no hyperemia Neurological: no tremor with outstretched hands    CMP ( most recent) CMP     Component Value Date/Time   NA 143 05/22/2022 1448   K 4.3 05/22/2022 1448   CL 102 05/22/2022 1448   CO2 25 05/22/2022 1448   GLUCOSE 103 (H) 05/22/2022 1448   GLUCOSE 102 (H) 04/25/2021 1343   BUN 12 05/22/2022 1448   CREATININE 0.70 05/22/2022 1448   CALCIUM 9.4 05/22/2022 1448   PROT 6.7 05/22/2022 1448   ALBUMIN 4.1 05/22/2022 1448   AST 34 05/22/2022 1448   ALT 37 (H) 05/22/2022 1448   ALKPHOS 49 05/22/2022 1448   BILITOT 0.5 05/22/2022 1448   GFRNONAA >60 04/25/2021 1343   GFRAA >60 09/12/2019 0239     Diabetic Labs (most recent): Lab Results  Component Value Date   HGBA1C 7.7 (H) 05/22/2022   HGBA1C 7.4 (H) 09/03/2019   HGBA1C  12/29/2008    6.0 (NOTE)   The ADA recommends the following therapeutic  goal for glycemic   control related to Hgb A1C measurement:   Goal of Therapy:   < 7.0% Hgb A1C   Reference: American Diabetes Association: Clinical Practice   Recommendations 2008, Diabetes Care,  2008, 31:(Suppl 1).     Lipid Panel ( most recent) Lipid Panel     Component Value Date/Time   CHOL 130 05/22/2022 1448   TRIG 89 05/22/2022 1448   HDL 45 05/22/2022 1448   CHOLHDL 2.9 05/22/2022 1448   LDLCALC 68 05/22/2022 1448   LABVLDL 17 05/22/2022 1448      Lab Results  Component Value Date   TSH 0.403 (L) 05/22/2022   FREET4 1.52 05/22/2022           Assessment & Plan:   1) Type 2 diabetes mellitus without complication, with long-term current use of insulin ([place])  She presents today for her consultation with her meter showing mostly at goal glycemic profile.  Her most recent A1c on 5/30 was 7.7%.  She monitors glucose 4 times daily.  She drinks coffee with SF creamer and splenda, and flavored water.  She eats 2 meals per day and a snack.  She does engage in routine physical activity, taking care of her disabled husband.  She is UTD on eye exam, sees podiatry routinely.  Analysis of her meter shows 7-day average of 127, 14-day average of 140, 30-day average of 145.  - [name] has currently uncontrolled symptomatic type 2 DM since 71 years of age, with most recent A1c of 7.7 %.   -Recent labs reviewed.  - I had a long discussion with her about the progressive nature of diabetes and the pathology behind its complications. -her diabetes is not currently complicated but she remains at a high risk for more acute and chronic complications which include CAD, CVA, CKD, retinopathy, and neuropathy. These are all discussed in detail with her.  The following Lifestyle Medicine recommendations according to [facility]) were discussed and offered to patient and she agrees to start the journey:  A. Whole Foods, Plant-based plate comprising of  fruits and vegetables, plant-based proteins, whole-grain carbohydrates was discussed in detail with the patient.   A list for source of those nutrients were also provided to the patient.  Patient will use only water or unsweetened tea for hydration. B.  The need to stay away from risky substances including alcohol, smoking; obtaining 7 to 9 hours of restorative sleep, at least 150 minutes of moderate intensity exercise weekly, the importance of healthy social connections,  and stress reduction techniques were discussed. C.  A full color page of  Calorie density of various food groups per pound showing examples of each food groups was provided to the patient.  - I have counseled her on diet and weight management by adopting a carbohydrate restricted/protein rich diet. Patient is encouraged to switch to unprocessed or minimally processed complex starch and increased protein intake (animal or plant source), fruits, and vegetables. -  she is advised to stick to a routine mealtimes to eat 3 meals a day and avoid unnecessary snacks (to snack only to correct hypoglycemia).   - she acknowledges that there is a room for improvement in her food and drink choices. - Suggestion is made for her to avoid simple carbohydrates from her diet including Cakes, Sweet Desserts, Ice Cream, Soda (diet and regular), Sweet Tea, Candies, Chips,  Cookies, Store Bought Juices, Alcohol in Excess of 1-2 drinks a day, Artificial Sweeteners, Coffee Creamer, and "Sugar-free" Products. This will help patient to have more stable blood glucose profile and potentially avoid unintended weight gain.  - I have approached her with the following individualized plan to manage her diabetes and patient agrees:   -She is advised to change her Lantus to 25 units SQ nightly (instead of splitting doses) and change her Humalog to 5-8 units TID with meals if glucose is above 90 and she is eating (Specific instructions on how to titrate insulin dosage  based on glucose readings given to patient in writing).  She demonstrated her ability to properly interpret the SSI to dose her insulin with me today.  -she is encouraged to continue monitoring glucose 4 times daily, before meals and before bed, to log their readings on the clinic sheets provided, and bring them to review at follow up appointment in 4 weeks.  She could greatly benefit from CGM device.  I sent in for Dexcom G7 to [facility].  - she is warned not to take insulin without proper monitoring per orders. - Adjustment parameters are given to her for hypo and hyperglycemia in writing. - she is encouraged to call clinic for blood glucose levels less than 70 or above 300 mg /dl.  - she is not a candidate for sulfa meds due to allergy.  - she will be considered for incretin therapy as appropriate next visit.  - Specific targets for  A1c; LDL, HDL, and Triglycerides were discussed with the patient.  2) Blood Pressure /Hypertension:  her blood pressure is controlled to target without the use of antihypertensive medications.  3) Lipids/Hyperlipidemia:    Review of her recent lipid panel from 05/22/22 showed controlled LDL at 68 .  she is advised to continue Lipitor 20 mg daily at bedtime.  Side effects and precautions discussed with her.  4)  Weight/Diet:  her Body mass index is 34.98 kg/m.  -  clearly complicating her diabetes care.   she is a candidate for weight loss. I discussed with her the fact that loss of 5 - 10% of her  current body weight will have the most impact on her diabetes management.  Exercise, and detailed carbohydrates information provided  -  detailed on discharge instructions.  5) Hypothyroidism-unspecified Her most recent TFTs are consistent with appropriate hormone replacement.  She is advised to continue Levothyroxine 112 mcg po daily before breakfast.   - The correct intake of thyroid hormone (Levothyroxine, Synthroid), is on empty stomach first thing in the  morning, with water, separated by at least 30 minutes from breakfast and other medications,  and separated by more than 4 hours from calcium, iron, multivitamins, acid reflux medications (PPIs).  - This medication is a life-long medication and will be needed to correct thyroid hormone imbalances for the rest of your life.  The dose may change from time to time, based on thyroid blood work.  - It is extremely important to be consistent taking this medication, near the same time each morning.  -AVOID TAKING PRODUCTS CONTAINING BIOTIN (commonly found in Hair, Skin, Nails vitamins) AS IT INTERFERES WITH THE VALIDITY OF THYROID FUNCTION BLOOD TESTS.  6) Chronic Care/Health Maintenance: -she is not on ACEI/ARB and is on Statin medications and is encouraged to initiate and continue to follow up with Ophthalmology, Dentist, Podiatrist at least yearly or according to recommendations, and advised to stay away from smoking. I have recommended yearly flu  vaccine and pneumonia vaccine at least every 5 years; moderate intensity exercise for up to 150 minutes weekly; and sleep for at least 7 hours a day.  - she is advised to maintain close follow up with Alvira Monday, FNP for primary care needs, as well as her other providers for optimal and coordinated care.   - Time spent in this patient care: 60 min, of which > 50% was spent in counseling her about her diabetes and the rest reviewing her blood glucose logs, discussing her hypoglycemia and hyperglycemia episodes, reviewing her current and previous labs/studies (including abstraction from other facilities) and medications doses and developing a long term treatment plan based on the latest standards of care/guidelines; and documenting her care.    Please refer to Patient Instructions for Blood Glucose Monitoring and Insulin/Medications Dosing Guide" in media tab for additional information. Please also refer to "Patient Self Inventory" in the Media tab for  reviewed elements of pertinent patient history.  [name] participated in the discussions, expressed understanding, and voiced agreement with the above plans.  All questions were answered to her satisfaction. she is encouraged to contact clinic should she have any questions or concerns prior to her return visit.     Follow up plan: - Return in about 4 weeks (around 07/26/2022) for Diabetes F/U, Bring meter and logs.    Rayetta Pigg, [facility] [address] [place], [place] [address] Phone: [phone] Fax: [phone]  06/28/2022, 3:35 PM

## 2022-06-28 NOTE — Progress Notes (Signed)
PTNS  Session # 4 of 12  Health & Social Factors: diabetes, taking fluid pills Caffeine: none Alcohol: none Daytime voids #per day: 10-15 Night-time voids #per night: all night Urgency: yes, no notice Incontinence Episodes #per day: always Ankle used: left Treatment Setting: 4 Feeling/ Response: feeling in toes, foot, and leg Comments: n/a  Performed By: Levi Aland, CMA  Follow Up: Follow up as scheduled.

## 2022-06-28 NOTE — Patient Instructions (Signed)

## 2022-06-29 ENCOUNTER — Telehealth: Payer: Self-pay

## 2022-06-29 NOTE — Telephone Encounter (Signed)
Pt called in stating she had a question about her medication list. Pt states that there are a few meds that she takes that are not listed.Pt was unsure if she should continue or stop taking those meds that are not listed. Please advise.  Cb#: [phone]

## 2022-06-30 IMAGING — DX DG HIP (WITH OR WITHOUT PELVIS) 2-3V*R*
3 series · 3 of 3 positions shown · non-contrast
Comparison: None.

CLINICAL DATA: Right hip pain after fall

EXAM:
DG HIP (WITH OR WITHOUT PELVIS) 2-3V RIGHT

[pelvis ap]
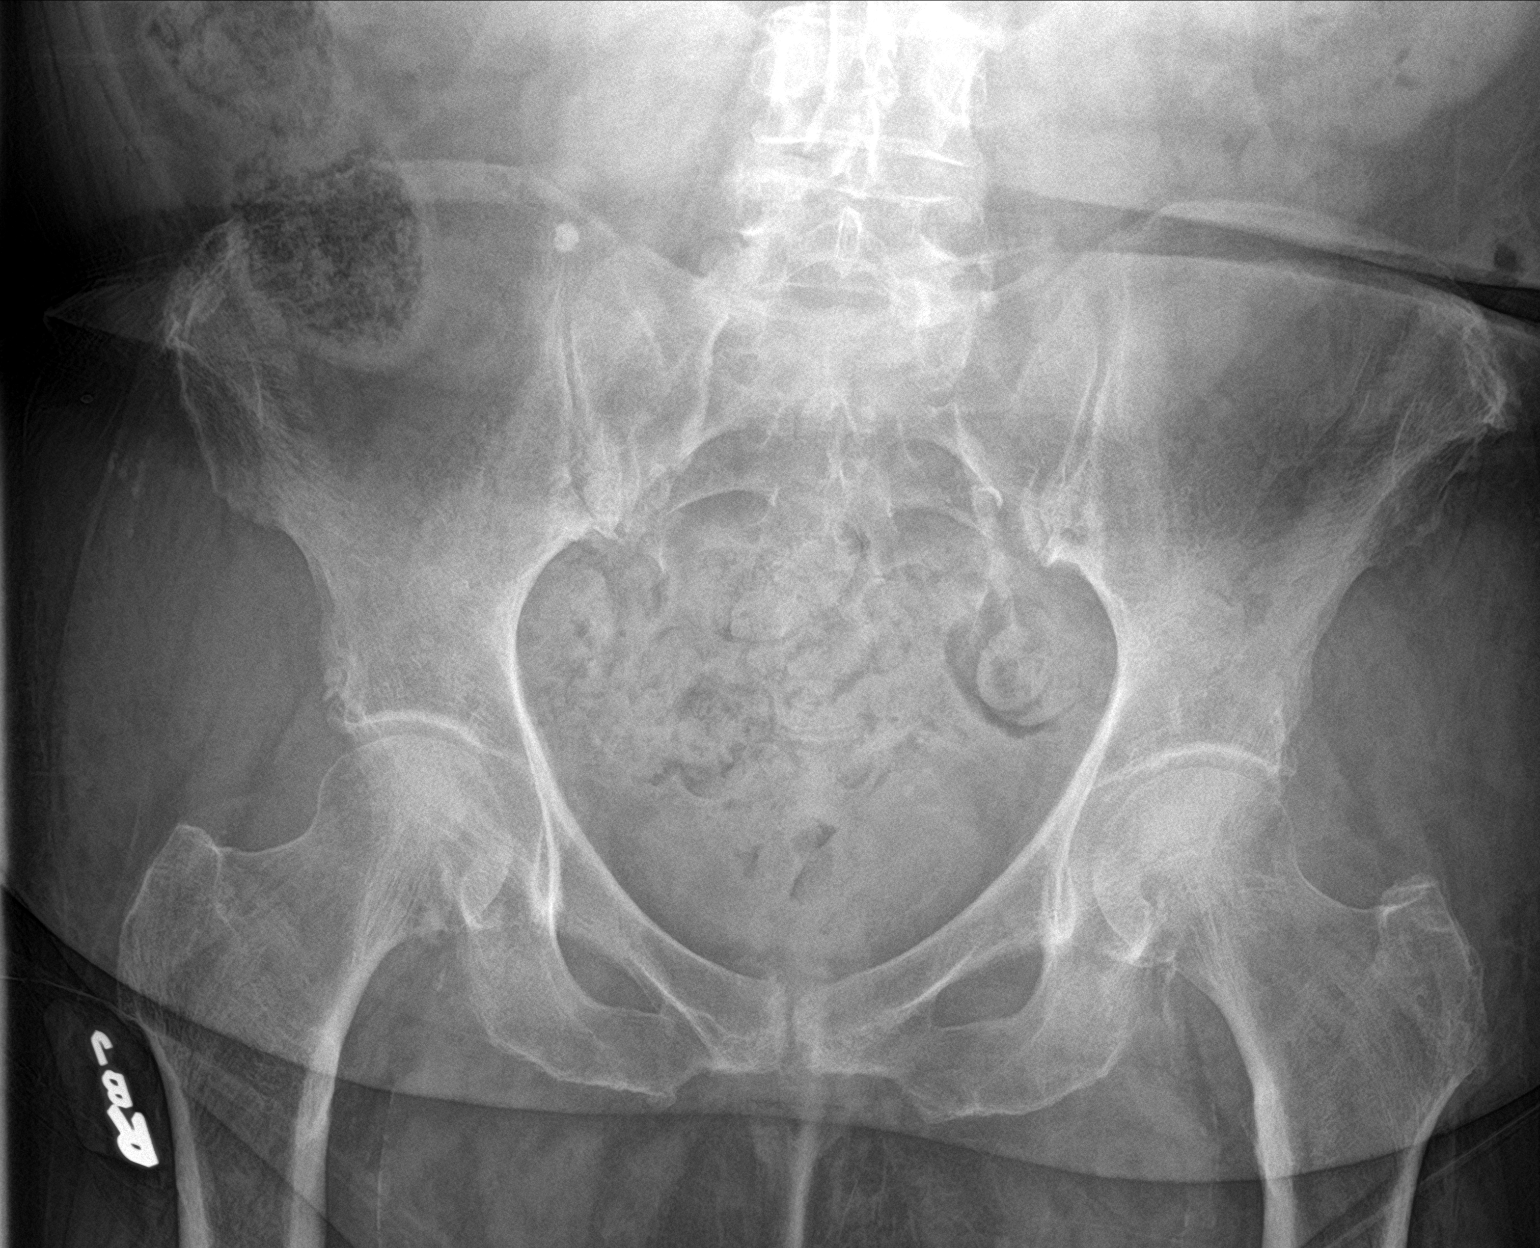

[hip ap]
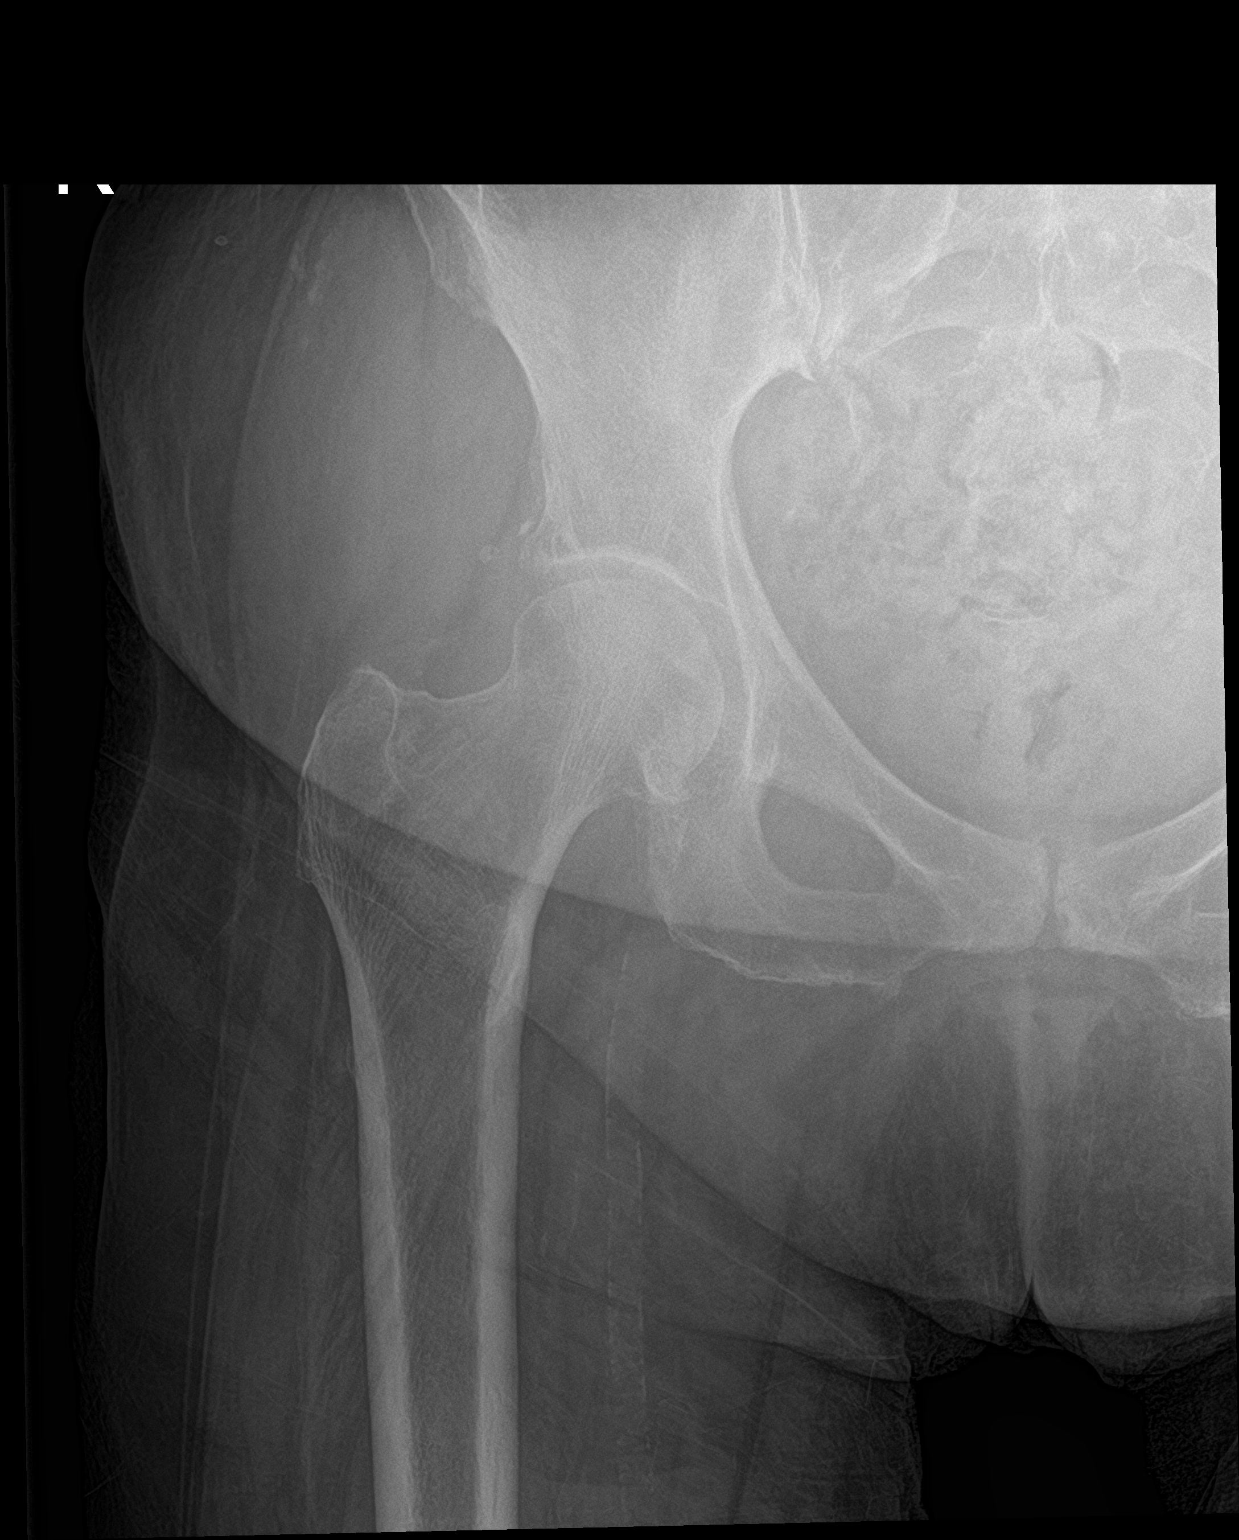

[hip lat]
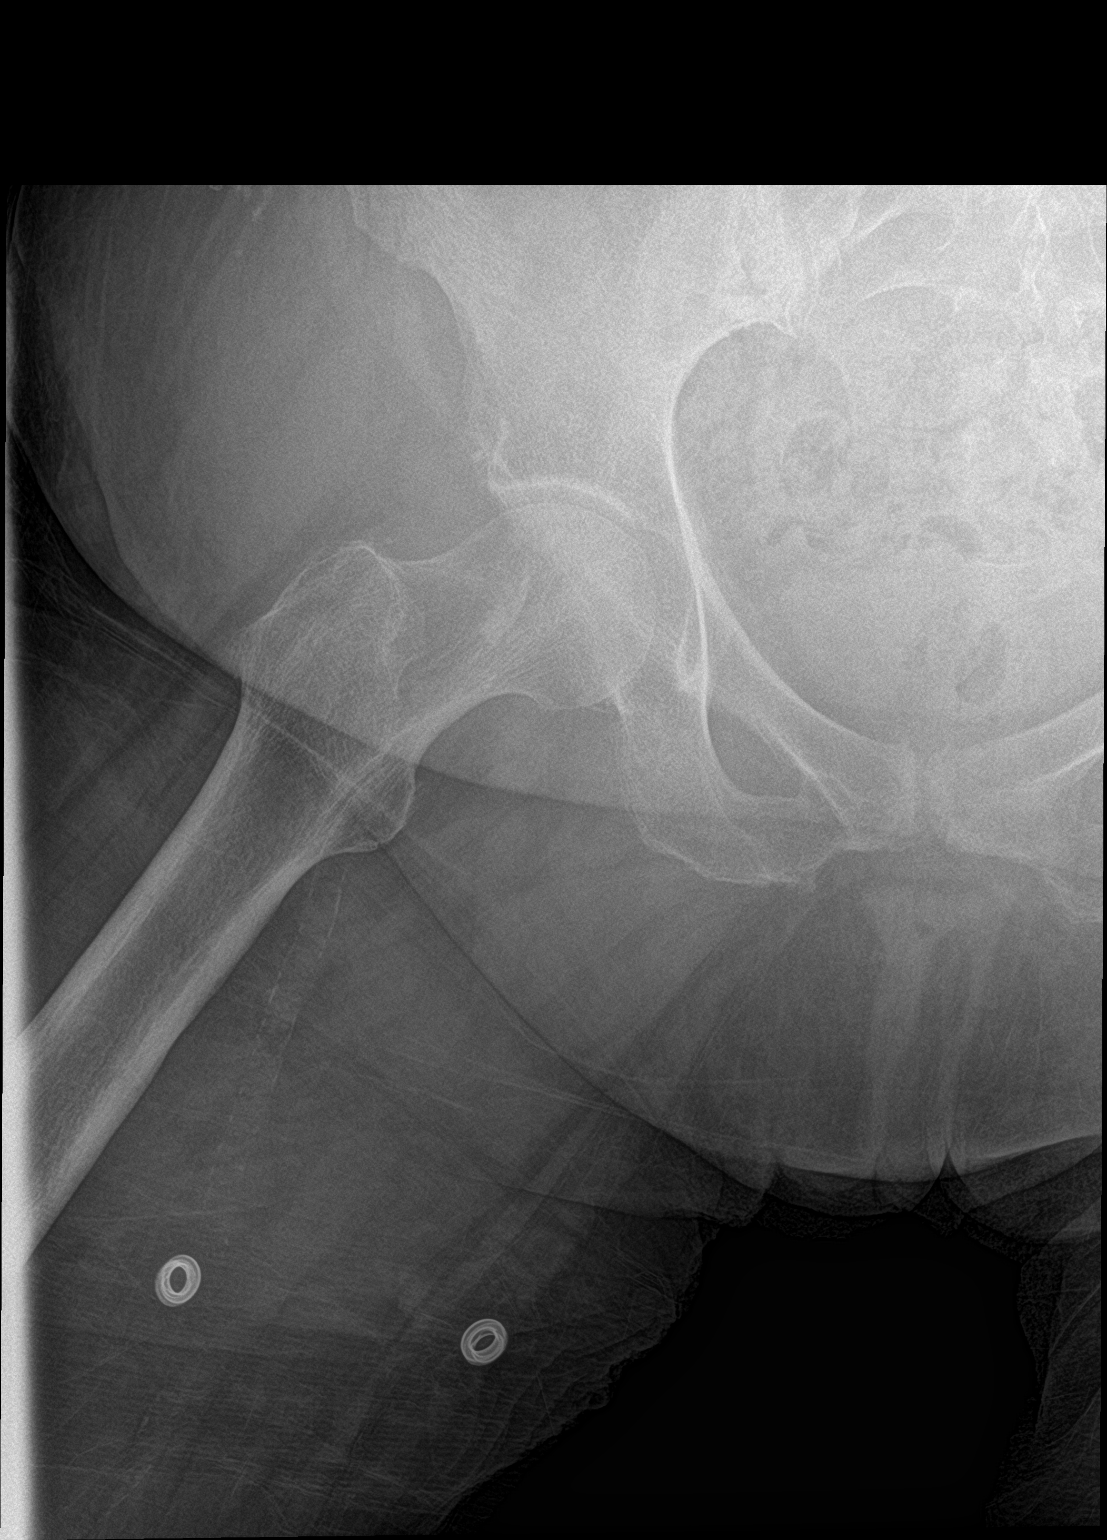

[3 of 3 positions shown; findings below may reference images not displayed]

FINDINGS: Frontal view of the pelvis as well as frontal and frogleg lateral
views of the right hip are obtained. No acute fracture, subluxation,
or dislocation. Mild symmetrical bilateral hip osteoarthritis.
Sacroiliac joints are normal.
IMPRESSION: 1. Bilateral hip osteoarthritis.  No acute fracture.

## 2022-06-30 IMAGING — DX DG SHOULDER 2+V*R*
2 series · 2 of 2 positions shown · non-contrast
Comparison: None.

CLINICAL DATA: Right shoulder pain after fall

EXAM:
RIGHT SHOULDER - 2+ VIEW

[shoulder grashey]
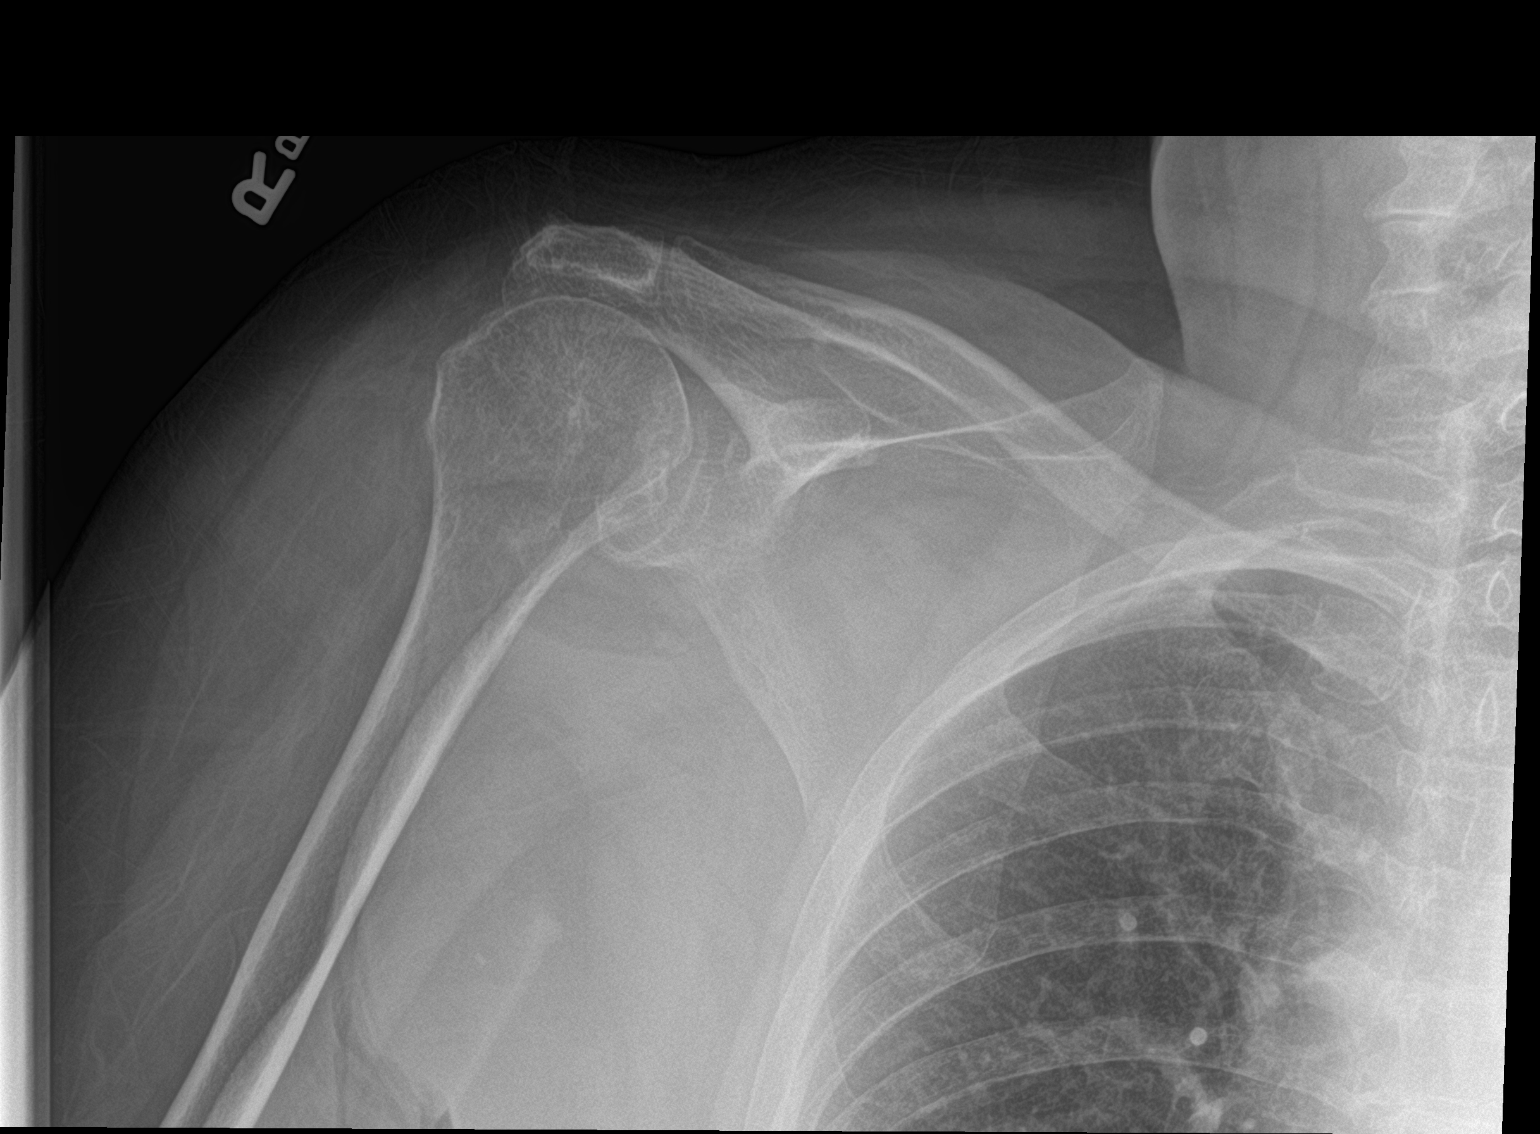

[shoulder y view]
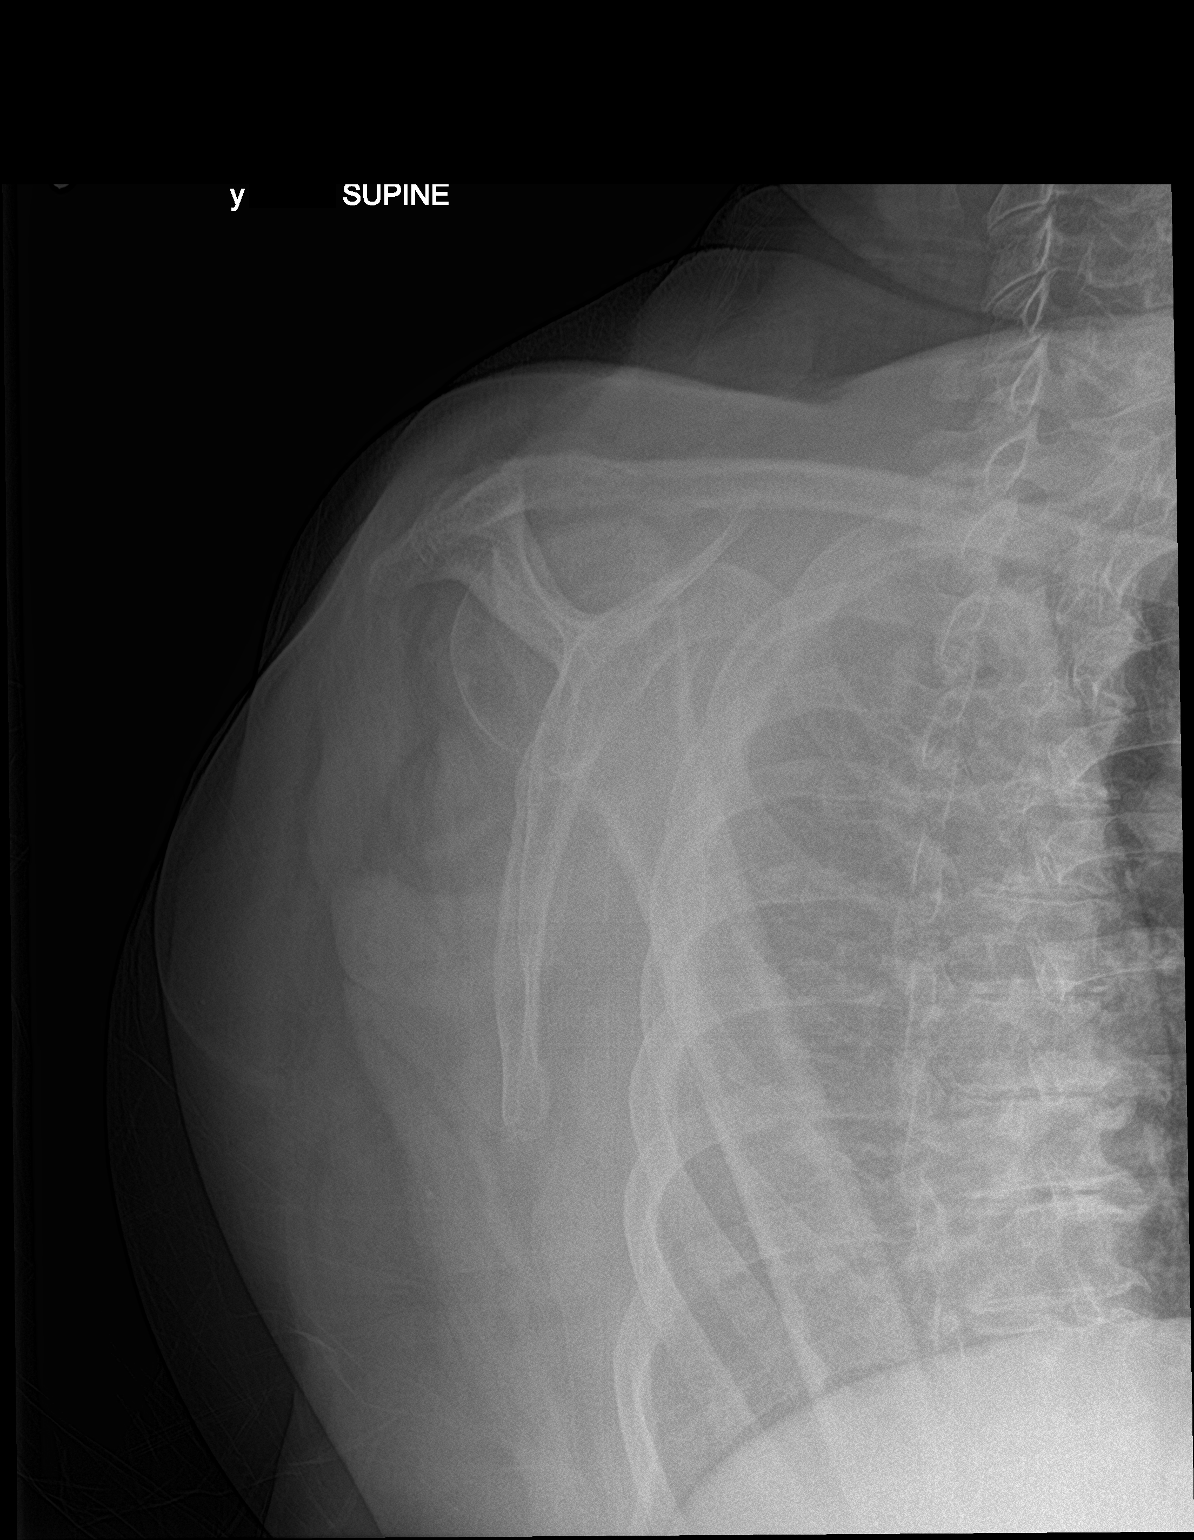

[2 of 2 positions shown; findings below may reference images not displayed]

FINDINGS: Frontal and transscapular views of the right shoulder are obtained.
No fracture, subluxation, or dislocation. Mild acromioclavicular and
glenohumeral joint osteoarthritis. Narrowing of the acromial humeral
interval may reflect chronic longstanding rotator cuff tear. Right
chest is clear.
IMPRESSION: 1. Degenerative changes.  No acute fracture.

## 2022-06-30 IMAGING — DX DG ANKLE COMPLETE 3+V*R*
3 series · 3 of 3 positions shown · non-contrast
Comparison: 06/04/2005

CLINICAL DATA: Right ankle pain after fall

EXAM:
RIGHT ANKLE - COMPLETE 3+ VIEW

[ankle ap]
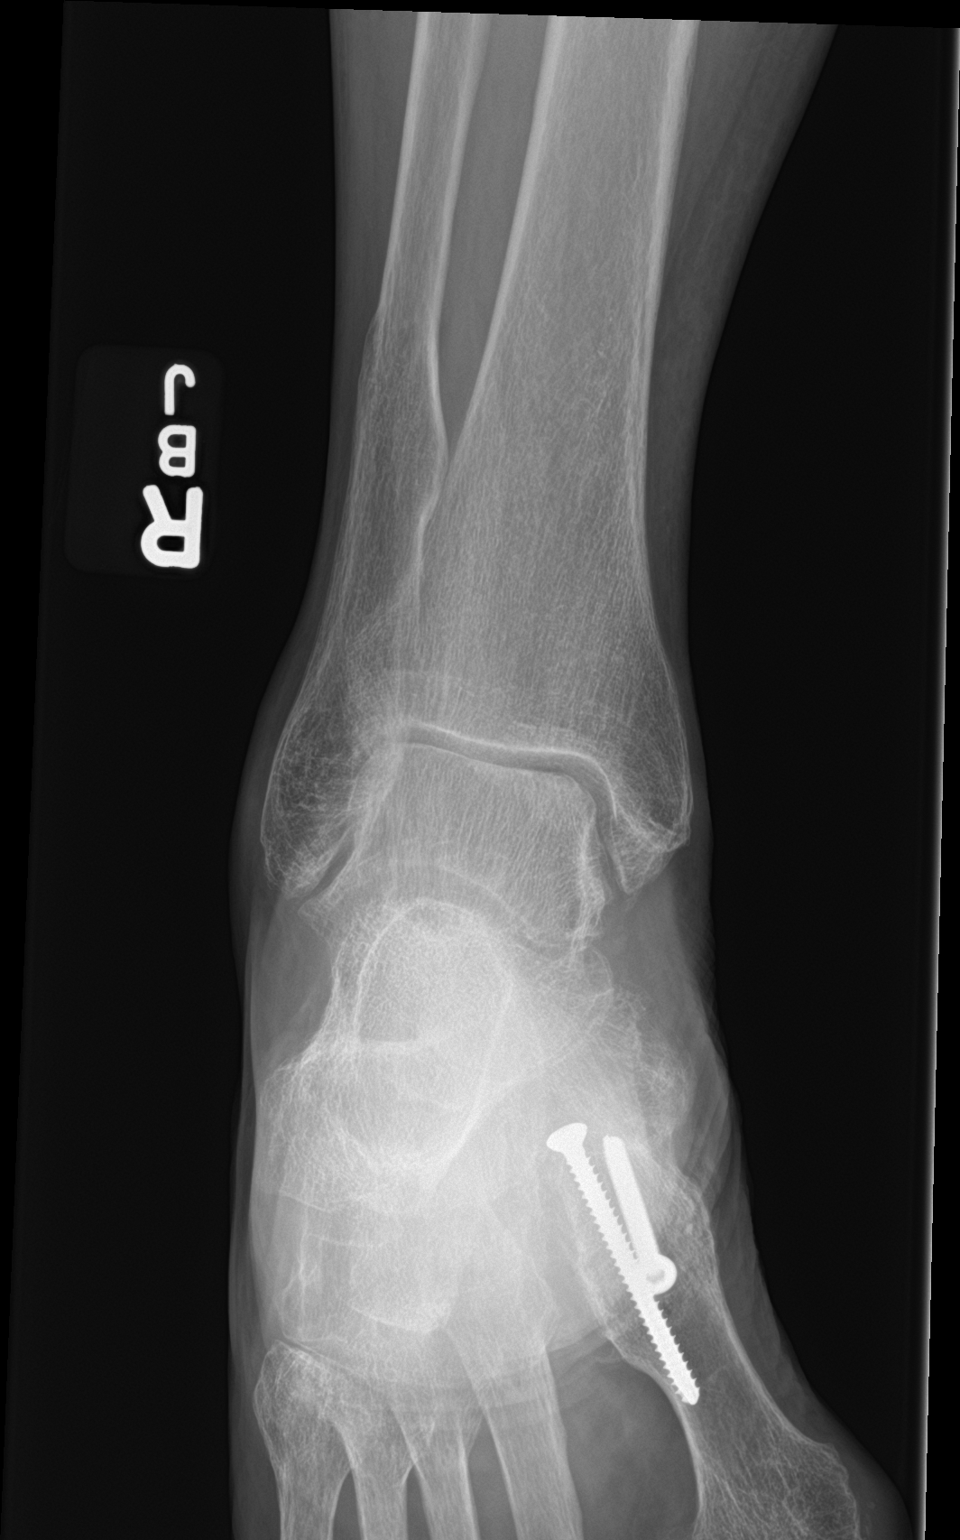

[ankle obl]
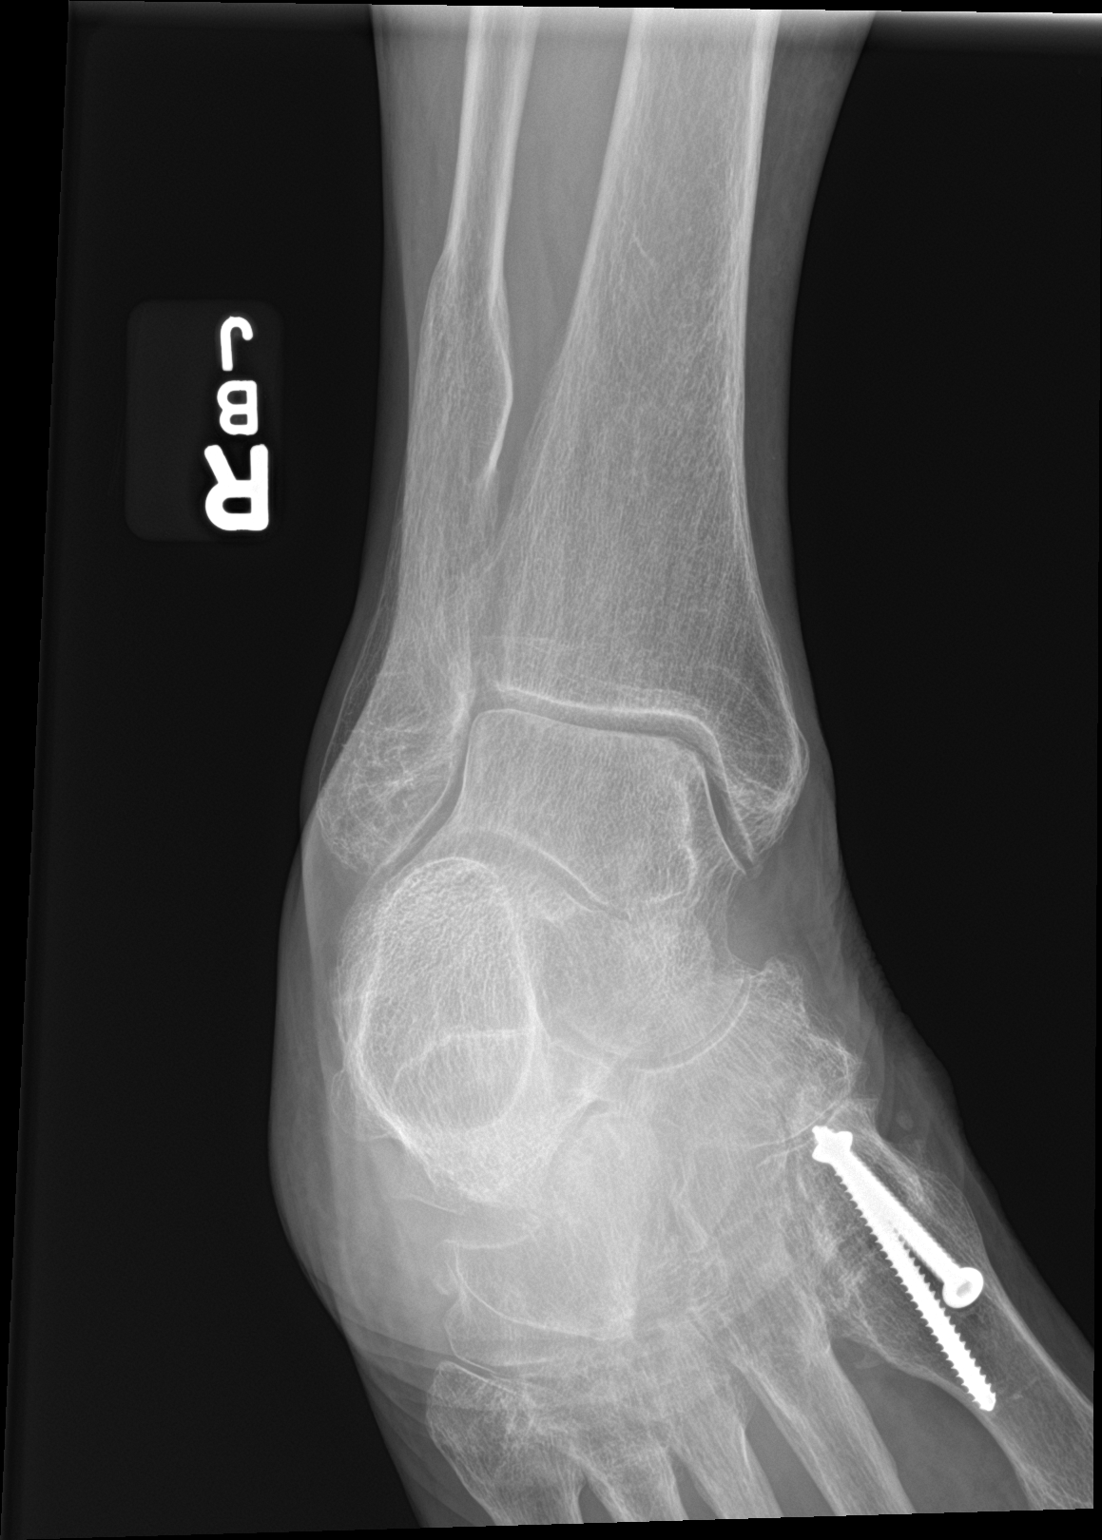

[ankle lat]
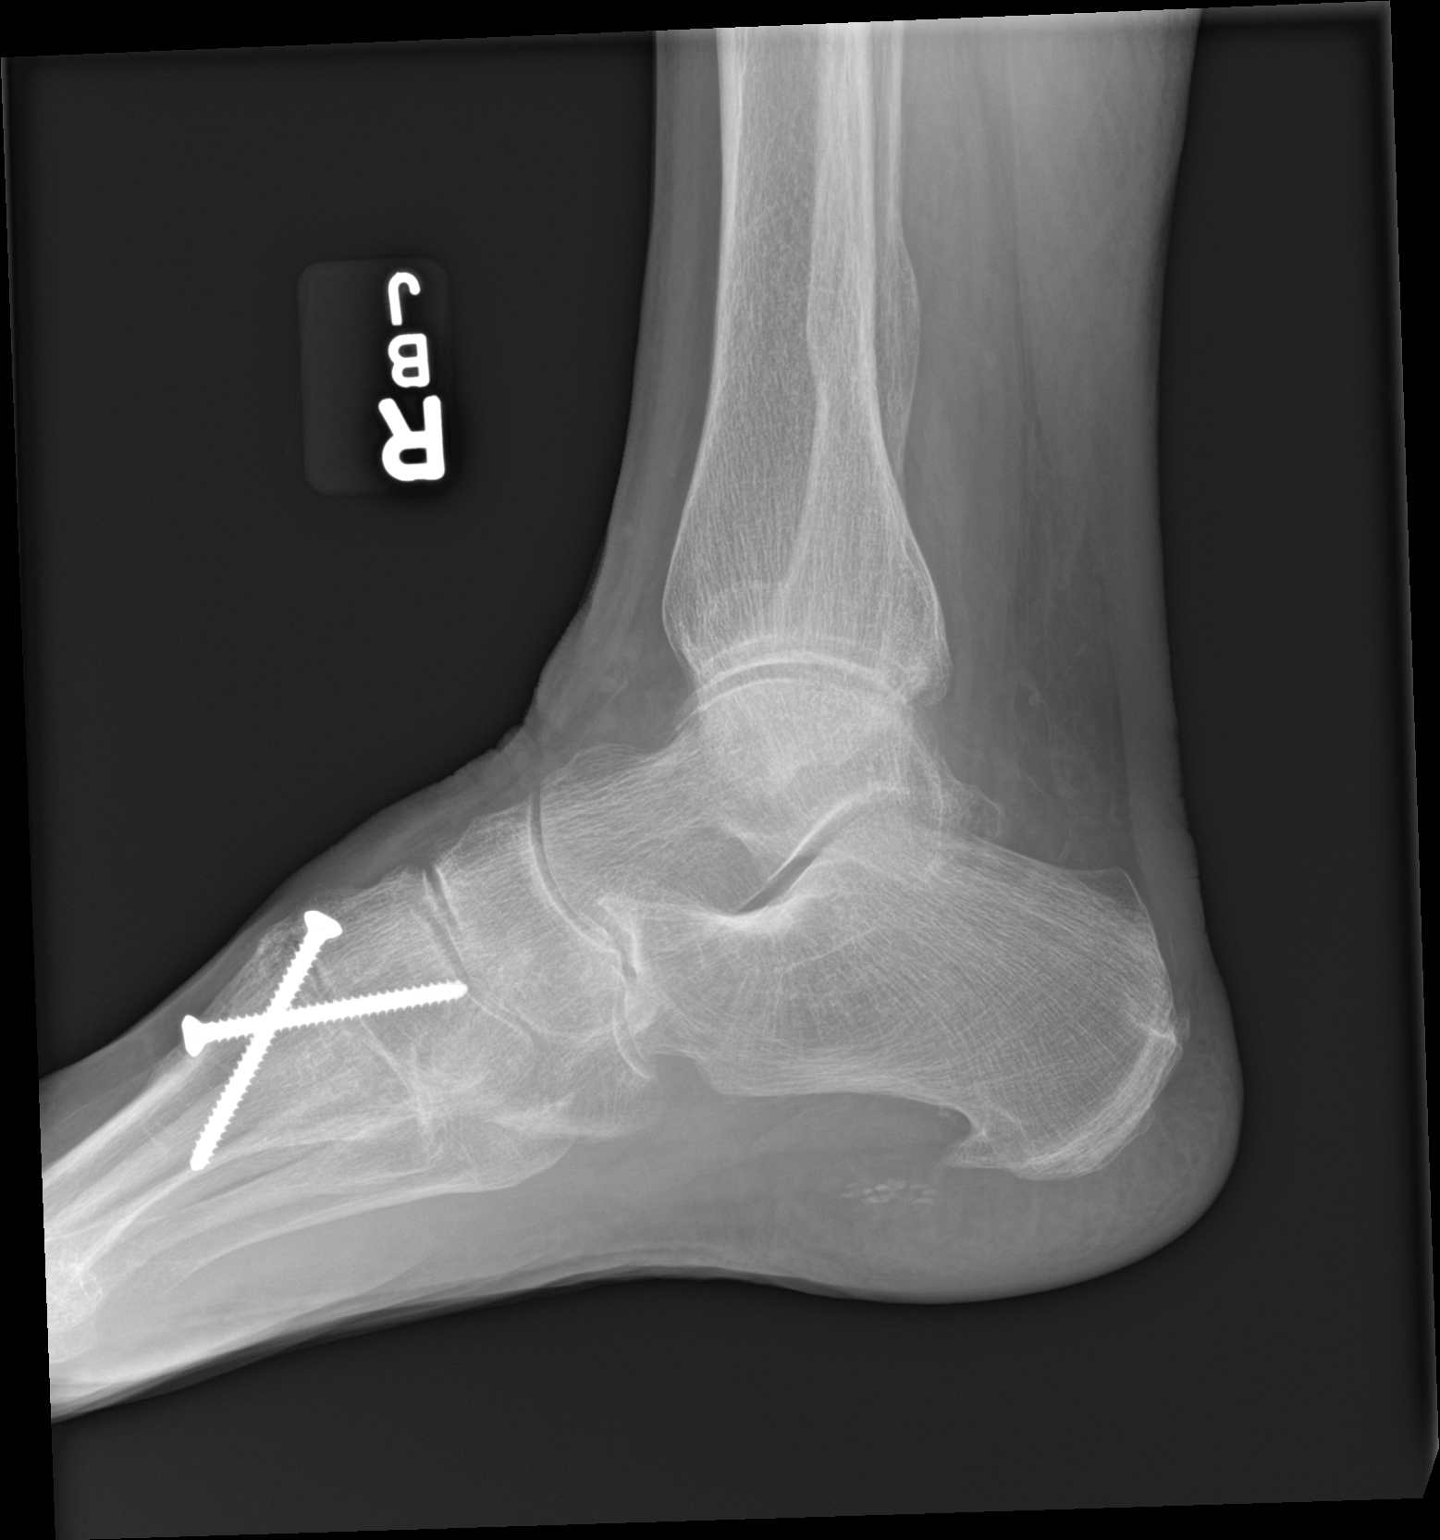

[3 of 3 positions shown; findings below may reference images not displayed]

FINDINGS: Frontal, oblique, lateral views of the right ankle are obtained.
Prior healed distal right fibular fracture noted. No acute fracture,
subluxation, or dislocation. There is mild osteoarthritis of the
ankle and hindfoot. Prominent inferior calcaneal spur. Postsurgical
changes across the first tarsometatarsal joint from previous fusion.
IMPRESSION: 1. No acute fracture.
2. Chronic posttraumatic, postsurgical, and degenerative changes as
above.

## 2022-06-30 IMAGING — DX DG RIBS W/ CHEST 3+V*L*
4 series · 4 of 4 positions shown · non-contrast
Comparison: 04/25/2021

CLINICAL DATA: Fell, left second rib pain

EXAM:
LEFT RIBS AND CHEST - 3+ VIEW

[chest pa]
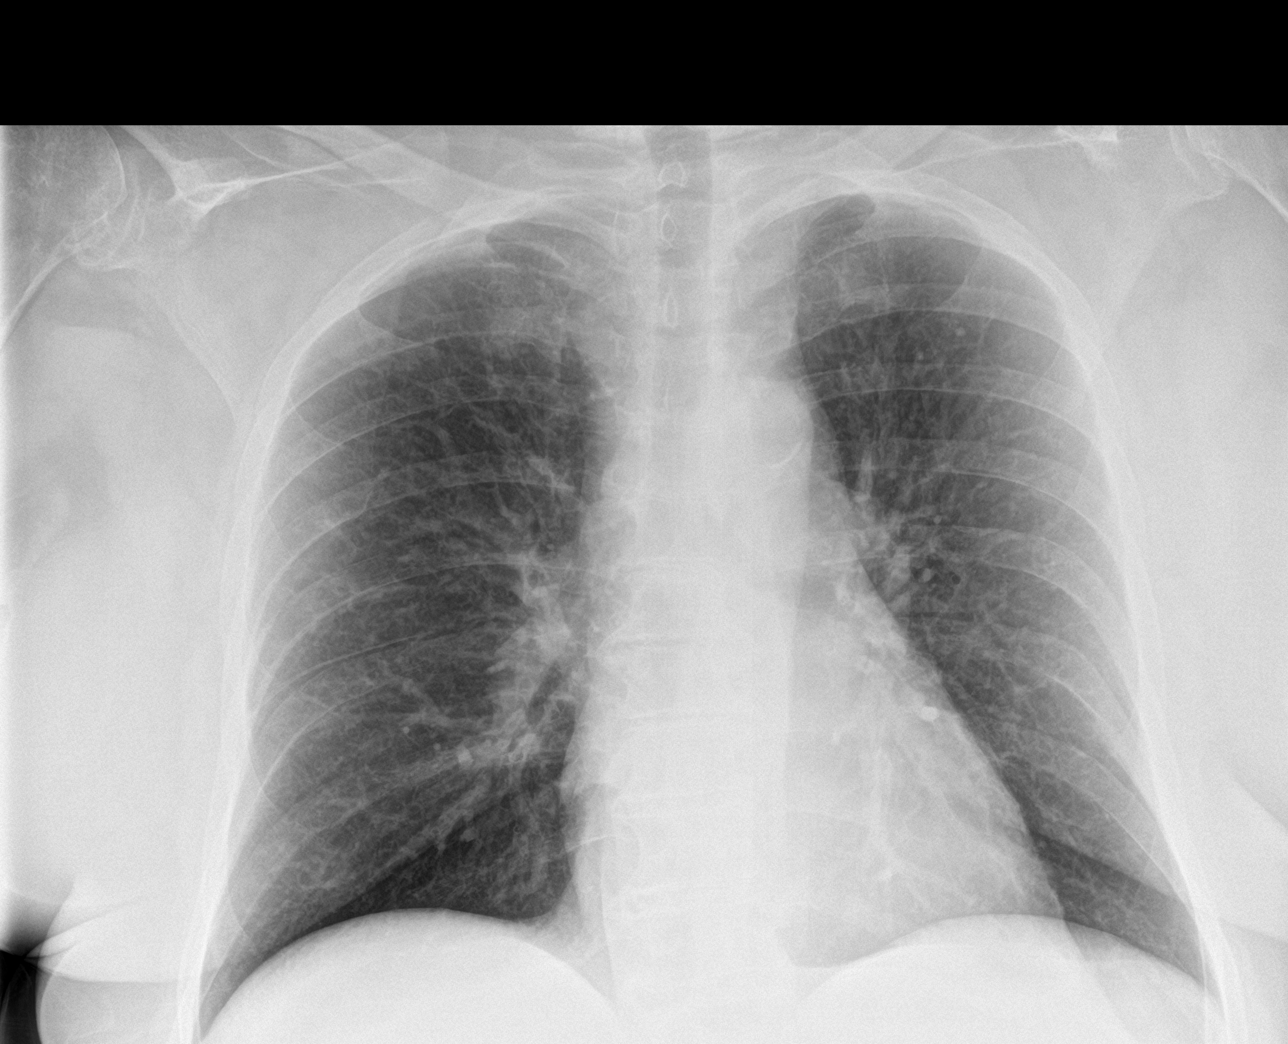

[rib pa obl (1 of 2)]
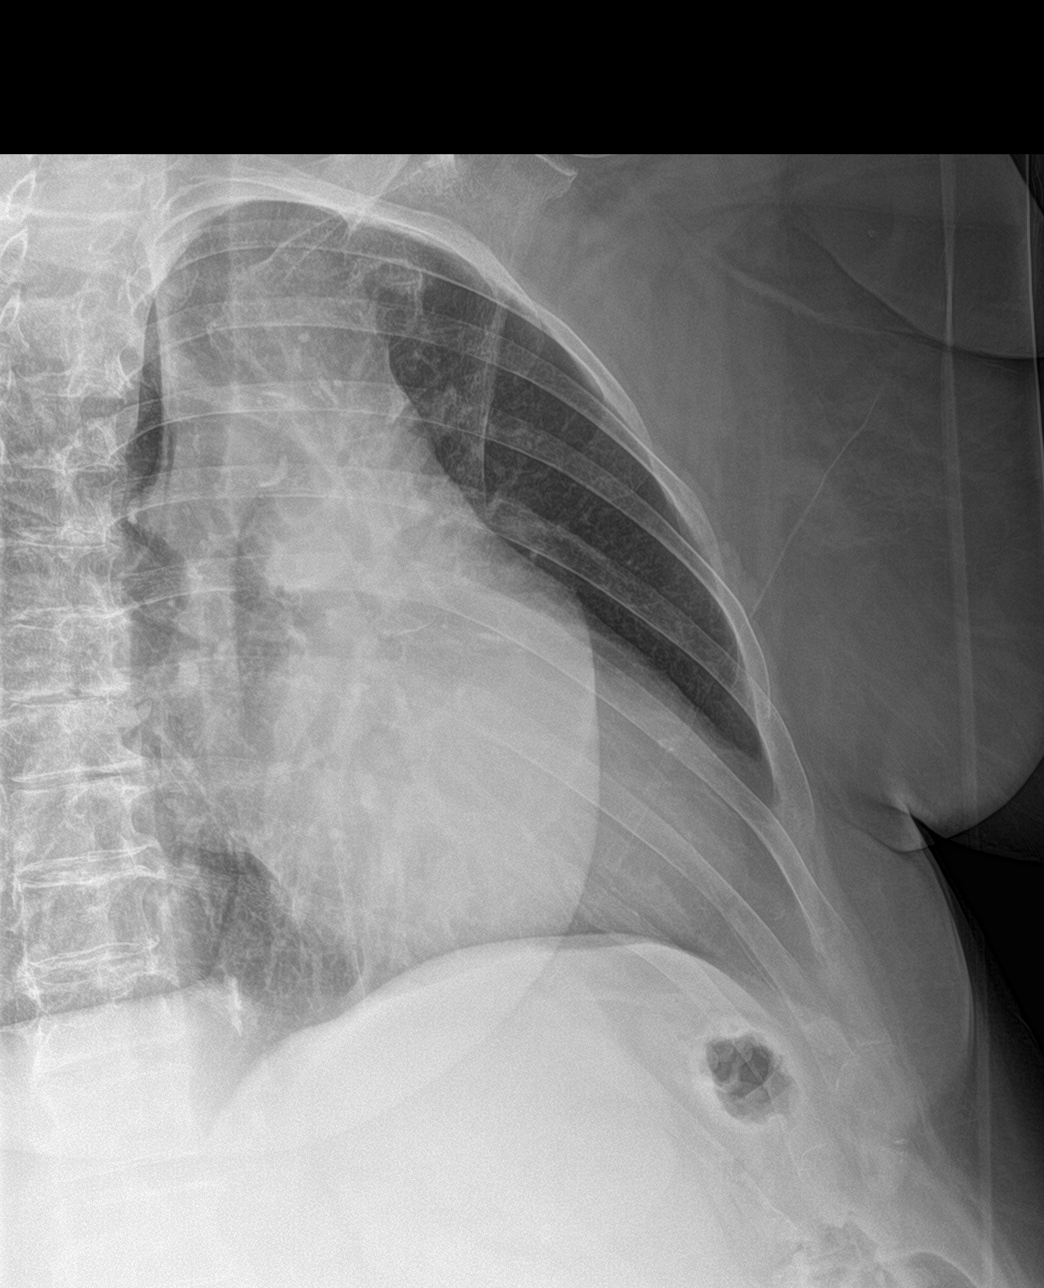

[rib pa obl (2 of 2)]
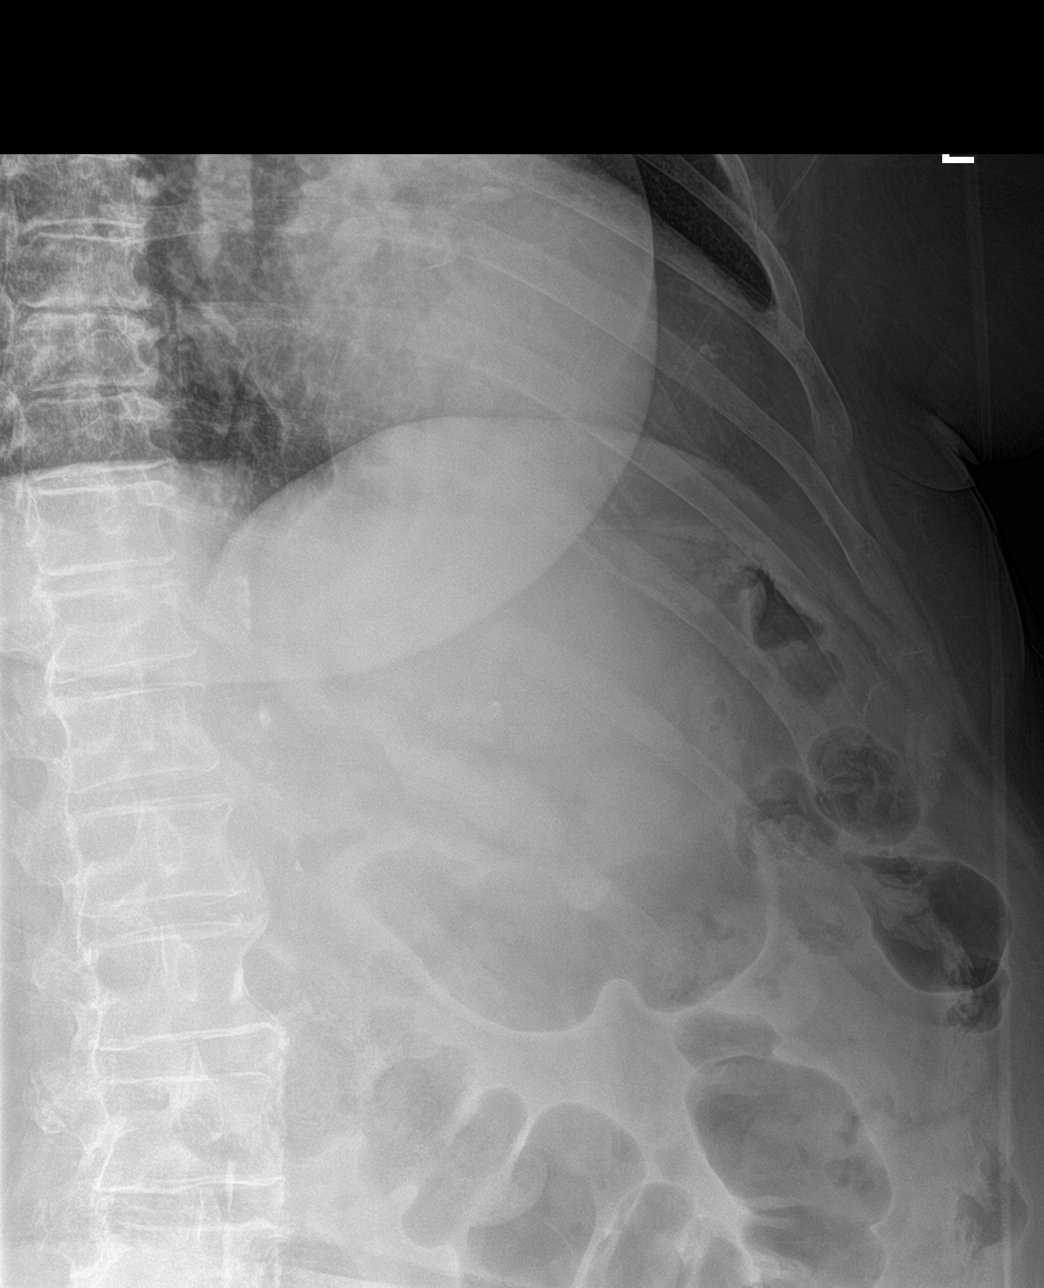

[rib pa]
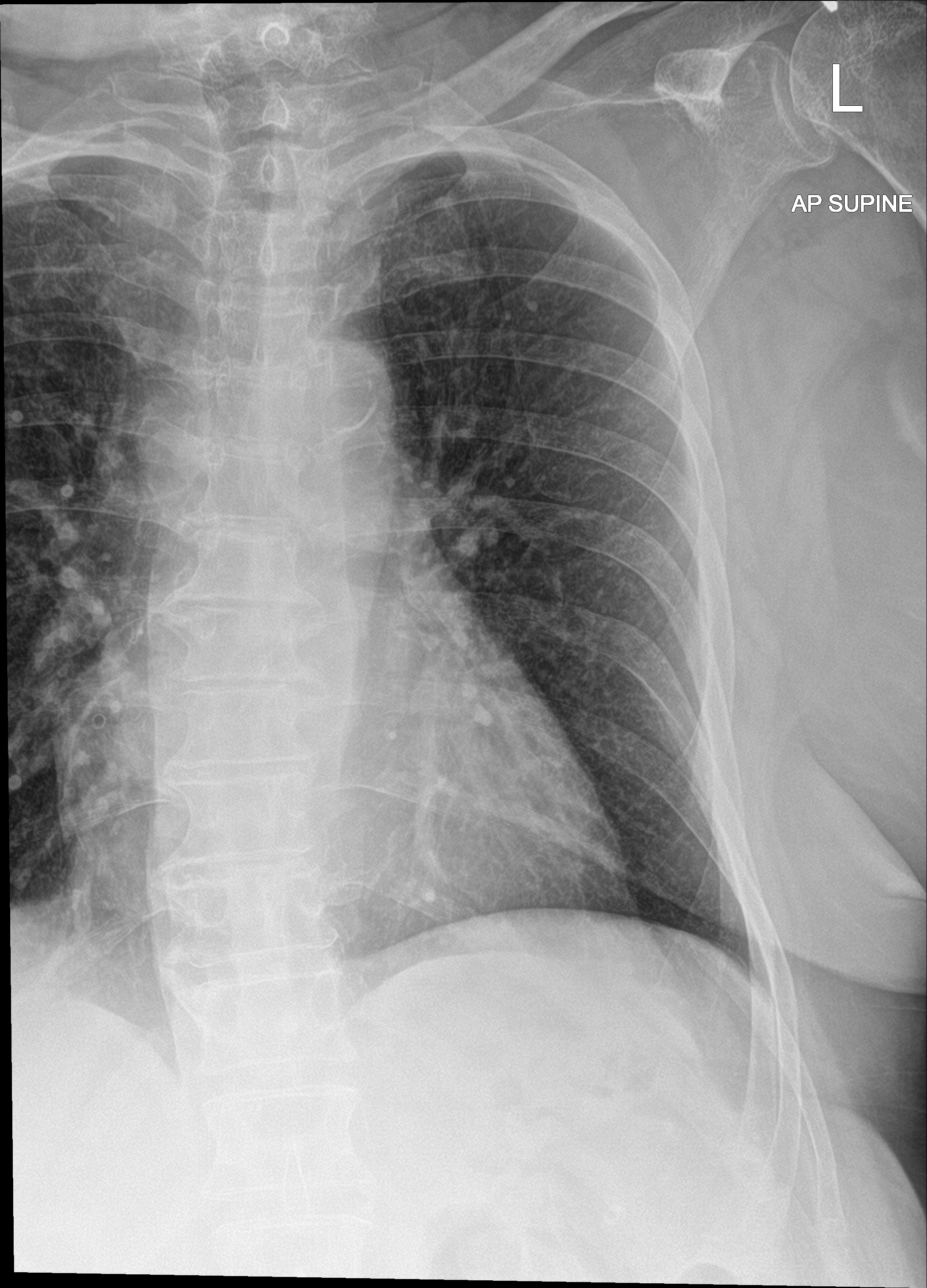

[4 of 4 positions shown; findings below may reference images not displayed]

FINDINGS: Frontal view of the chest as well as frontal and oblique views of
the left thoracic cage are obtained. Cardiac silhouette is
unremarkable. No airspace disease, effusion, or pneumothorax. There
are no acute displaced fractures.
IMPRESSION: 1. No acute intrathoracic process.

## 2022-07-03 ENCOUNTER — Other Ambulatory Visit: Payer: Self-pay | Admitting: Family Medicine

## 2022-07-03 ENCOUNTER — Telehealth: Payer: Self-pay | Admitting: Family Medicine

## 2022-07-03 DIAGNOSIS — R110 Nausea: Secondary | ICD-10-CM

## 2022-07-03 MED ORDER — ONDANSETRON HCL 4 MG PO TABS: 4.0000 mg | ORAL_TABLET | Freq: Four times a day (QID) | ORAL | 2 refills | Status: DC | PRN

## 2022-07-03 NOTE — Telephone Encounter (Signed)
Pt would like refills on ondansetron and has questions about clonazepam if she still supposed to be taking this? If so she said she ran out, please advice? If ok I can send the refills in.Marland KitchenMarland Kitchen

## 2022-07-03 NOTE — Telephone Encounter (Signed)
Informed pt results are still pending.

## 2022-07-03 NOTE — Telephone Encounter (Signed)
Patient called in regard to cologuard results.

## 2022-07-03 NOTE — Telephone Encounter (Signed)
Please inform pt that I've sent a refill of her ondansetron. I recommend taking trazodone for sleep instead of clonazepam.

## 2022-07-03 NOTE — Telephone Encounter (Signed)
Left vm asking for a return call.

## 2022-07-04 NOTE — Telephone Encounter (Signed)
Pt informed

## 2022-07-04 NOTE — Telephone Encounter (Signed)
Left vm

## 2022-07-05 ENCOUNTER — Ambulatory Visit (INDEPENDENT_AMBULATORY_CARE_PROVIDER_SITE_OTHER): Payer: 59 | Admitting: Physician Assistant

## 2022-07-05 ENCOUNTER — Telehealth: Payer: Self-pay | Admitting: Family Medicine

## 2022-07-05 ENCOUNTER — Ambulatory Visit: Payer: BC Managed Care – PPO | Admitting: Nurse Practitioner

## 2022-07-05 VITALS — BP 159/89 | HR 72

## 2022-07-05 DIAGNOSIS — N3941 Urge incontinence: Secondary | ICD-10-CM

## 2022-07-05 LAB — COLOGUARD: COLOGUARD: NEGATIVE

## 2022-07-05 NOTE — Telephone Encounter (Signed)
Forwarding to provider.

## 2022-07-05 NOTE — Progress Notes (Signed)
Discussed and demonstrated set up and use of [place] receiver. Demonstrated and applied Dexcom G7 sensor in to L upper arm without difficulty. Pt's questions answered and understanding voiced. Advised pt if she had any further questions or concerns to contact the office. Understanding voiced.

## 2022-07-05 NOTE — Progress Notes (Signed)
PTNS  Session # 5  Health & Social Factors: Diabetes, take fluid pills Caffeine: none Alcohol: none Daytime voids #per day: 15-20 Night-time voids #per night: All night Urgency: yes Incontinence Episodes #per day: everytime Ankle used: Left Treatment Setting: 5 Feeling/ Response: strong foot and heel response Comments:   Performed By: Estill Bamberg RN  Follow Up: Weekly PTNS

## 2022-07-05 NOTE — Telephone Encounter (Signed)
Patient called with [name] to ER 7/12 afternoon ([place]) Has paperwork she will bring by office for provider to review.

## 2022-07-06 ENCOUNTER — Telehealth: Payer: Self-pay | Admitting: Family Medicine

## 2022-07-06 NOTE — Telephone Encounter (Signed)
Patient called in about med change

## 2022-07-06 NOTE — Progress Notes (Signed)
Please inform the patient that her cologurad is negative for colon cancer.

## 2022-07-10 NOTE — Telephone Encounter (Signed)
Attempted to call patient unable to reach her.

## 2022-07-11 ENCOUNTER — Ambulatory Visit (INDEPENDENT_AMBULATORY_CARE_PROVIDER_SITE_OTHER): Payer: 59 | Admitting: Family Medicine

## 2022-07-11 ENCOUNTER — Encounter: Payer: Self-pay | Admitting: Family Medicine

## 2022-07-11 VITALS — BP 150/70 | HR 72 | Resp 16 | Ht 67.0 in | Wt 211.8 lb

## 2022-07-11 DIAGNOSIS — F419 Anxiety disorder, unspecified: Secondary | ICD-10-CM

## 2022-07-11 DIAGNOSIS — E1100 Type 2 diabetes mellitus with hyperosmolarity without nonketotic hyperglycemic-hyperosmolar coma (NKHHC): Secondary | ICD-10-CM | POA: Diagnosis not present

## 2022-07-11 DIAGNOSIS — Z794 Long term (current) use of insulin: Secondary | ICD-10-CM

## 2022-07-11 DIAGNOSIS — M542 Cervicalgia: Secondary | ICD-10-CM

## 2022-07-11 DIAGNOSIS — R030 Elevated blood-pressure reading, without diagnosis of hypertension: Secondary | ICD-10-CM

## 2022-07-11 MED ORDER — PREDNISONE 20 MG PO TABS: 20.0000 mg | ORAL_TABLET | Freq: Two times a day (BID) | ORAL | 0 refills | Status: DC

## 2022-07-11 NOTE — Telephone Encounter (Signed)
Attempted to contact pt multiple times.

## 2022-07-11 NOTE — Patient Instructions (Signed)
F/u in 1  to 2 week with  PCP Peter Congo, call if you need to be seen sooner  Short course of Prednisone for 5 days is prescribed. Your blood sugar may increase on the prednisone so watch out for this  Thanks for choosing [facility], we consider it a privelige to serve you.

## 2022-07-12 ENCOUNTER — Ambulatory Visit (INDEPENDENT_AMBULATORY_CARE_PROVIDER_SITE_OTHER): Payer: 59 | Admitting: Urology

## 2022-07-12 ENCOUNTER — Encounter: Payer: Self-pay | Admitting: Urology

## 2022-07-12 VITALS — BP 167/71 | HR 64 | Ht 67.0 in | Wt 212.0 lb

## 2022-07-12 DIAGNOSIS — R350 Frequency of micturition: Secondary | ICD-10-CM | POA: Diagnosis not present

## 2022-07-12 DIAGNOSIS — N3941 Urge incontinence: Secondary | ICD-10-CM | POA: Diagnosis not present

## 2022-07-12 NOTE — Progress Notes (Signed)
Assessment: 1. Urge incontinence   2. Urine frequency      Plan: Continue Gemtesa  Continue PTNS. We will reassess efficacy after her next 2 treatments.   Chief Complaint:  Chief Complaint  Patient presents with   Urinary Incontinence   History of Present Illness:  [name] is a 71 y.o. year old female who is seen for further evaluation of urinary incontinence.  At her initial visit in 11/22, she reported an 33-monthhistory of frequency, urgency, and nocturia.  She was voiding at least every 2 hours.  She reported urge incontinence as well as nighttime incontinence.  She was using multiple pads and pull-ups throughout the day.  She reported voiding with a good stream and felt like she emptied her bladder completely.  No dysuria or gross hematuria.  No history of UTIs.  No fecal incontinence.  She was on oxybutynin 5 mg daily without improvement.  She had not tried any other medical therapy. PVR:  49 ml. She was given a trial of Vesicare 10 mg daily at her visit in 11/22.  At her return visit in 12/22, she was taking the Vesicare every morning.  She had noted improvement in her daytime incontinence.  She reported the ability to get to the bathroom without leakage.  She continued to have nighttime incontinence.  She had decreased her daytime pull-up use but continued to require several pads through the night.  No dysuria or gross hematuria.  No side effects from the Vesicare.  Despite taking Vesicare 10 mg, she continued to report frequency, urgency, nocturia, and urge incontinence.  She was trying to void every 2 hours during the nighttime to avoid incontinence episodes.  She continued to have some urge incontinence during the daytime.  No dysuria or gross hematuria.  She underwent evaluation with urodynamics on 02/26/2022.  The study showed a slightly decreased capacity of 394 mL.  She did have unstable bladder contractions with associated leakage.  No stress incontinence.   She was able to void with some straining.  PVR = 148 mL.  She was given a trial of trospium 20 mg twice daily at her visit on 02/08/2022 but did not notice any improvement in her incontinence with this medication.  She continued to have frequency, urgency, urge incontinence, and nocturia.  She continued to require incontinence pads.  No dysuria or gross hematuria. Treatment options were discussed.  She elected to continue to try medical therapy.  She was given a trial of Myrbetriq 50 mg daily.  At her visit in May 2023, she reported some initial improvement with Myrbetriq but the results only lasted for a week.  She continued to have frequency, urgency, nocturia, and incontinence with urgency and throughout the night.  No dysuria or gross hematuria. She was started on PTNS.  She has received a total of 5 treatments to date. She presents today for PTNS #6.  [place]  She reports some slight improvement in her symptoms.  She continues to have frequency, urgency, and incontinence with and without sensory awareness.  Portions of the above documentation were copied from a prior visit for review purposes only.  Past Medical History:  Past Medical History:  Diagnosis Date   Anemia    as a teenager   Anxiety    Arthritis    knees, rt hand,hips   [place]    vaginal,uterine,ovariam   COPD (chronic obstructive pulmonary disease) (HCC)    Depression    Diabetes  mellitus without complication (HCC)    Hypothyroidism    Neuromuscular disorder ([place])    hands and feet    Past Surgical History:  Past Surgical History:  Procedure Laterality Date   ABDOMINAL HYSTERECTOMY     BREAST SURGERY Right 1988   lumpectomy   EYE SURGERY Bilateral    FRACTURE SURGERY     Rt ankle,lt knee cap,lt shoulder,rt wrist,rt shoulder,   TONSILLECTOMY     at age 44   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   Procedure: TOTAL KNEE ARTHROPLASTY;  Surgeon: Earlie Server, MD;  Location: [facility];   Service: Orthopedics;  Laterality: Right;    Allergies:  Allergies  Allergen Reactions   Ambien [Zolpidem Tartrate] Other (See Comments)    Hallucinations/nightmares   Bee Venom Swelling   Coconut Flavor Swelling    Mouth swells   Codeine Nausea And Vomiting   Onion Nausea Only and Other (See Comments)    Raw onions   Other     Ground Beef (patient avoid due to side effect of diarrhea)   Sulfa Antibiotics Rash and Other (See Comments)    Including topical sulfa  (mouth dries/peels/skin blisters)    Family History:  Family History  Problem Relation Age of Onset   Cancer Mother    Hypertension Mother    Thyroid disease Mother    Diabetes Mother    Stroke Mother    Heart failure Mother    Diabetes Father    Heart attack Father    Diabetes Sister    Diabetes Brother     Social History:  Social History   Tobacco Use   Smoking status: Former    Packs/day: 0.50    Years: 15.00    Total pack years: 7.50    Types: Cigarettes    Quit date: 2013    Years since quitting: 10.5   Smokeless tobacco: Never  Vaping Use   Vaping Use: Never used  Substance Use Topics   Alcohol use: Never   Drug use: Never    ROS: Constitutional:  Negative for fever, chills, weight loss CV: Negative for chest pain, previous MI, hypertension Respiratory:  Negative for shortness of breath, wheezing, sleep apnea, frequent cough GI:  Negative for nausea, vomiting, bloody stool, GERD  Physical exam: BP (!) 167/71   Pulse 64   Ht '5\' 7"'$  (1.702 m)   Wt 212 lb (96.2 kg)   BMI 33.20 kg/m  GENERAL APPEARANCE:  Well appearing, well developed, well nourished, NAD HEENT:  Atraumatic, normocephalic, oropharynx clear NECK:  Supple without lymphadenopathy or thyromegaly ABDOMEN:  Soft, non-tender, no masses EXTREMITIES:  Moves all extremities well, without clubbing, cyanosis, or edema NEUROLOGIC:  Alert and oriented x 3, normal gait, CN II-XII grossly intact MENTAL STATUS:  appropriate BACK:   Non-tender to palpation, No CVAT SKIN:  Warm, dry, and intact   Results: No sample provided

## 2022-07-12 NOTE — Progress Notes (Signed)
PTNS  Session # 6 of 12  Health & Social Factors: Diabetes Caffeine: none Alcohol: none Daytime voids #per day: all day Night-time voids #per night: all night Urgency: yes Incontinence Episodes #per day: always Ankle used: left Treatment Setting: 4 Feeling/ Response: Feeling in foot and toes Comments: n/a  Performed By: Levi Aland, CMA  Follow Up: Follow up as scheduled.

## 2022-07-16 ENCOUNTER — Encounter: Payer: Self-pay | Admitting: Family Medicine

## 2022-07-16 DIAGNOSIS — R030 Elevated blood-pressure reading, without diagnosis of hypertension: Secondary | ICD-10-CM | POA: Insufficient documentation

## 2022-07-16 DIAGNOSIS — M542 Cervicalgia: Secondary | ICD-10-CM | POA: Insufficient documentation

## 2022-07-16 NOTE — Assessment & Plan Note (Signed)
Increased due to current debilitating neck pain

## 2022-07-16 NOTE — Assessment & Plan Note (Signed)
Uncontrolled , discussed the possibility of high blood sugar while taking prednisone, she is to call in with concerns, will f/u with PCP in 2 weeks

## 2022-07-16 NOTE — Progress Notes (Signed)
   [name]     MRN: [id number]      DOB: 1951-04-24   HPI [name] is here for follow up of rcent urgent care visit for neck pain and spasm x 3 days. Still has significant pain. 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 First episode. She is unfortunately an uncontrolled diabetic , which limits steroid option but based on presentation will proceed with limited course and pt education   ROS Denies recent fever or chills.Denies sinus pressure, nasal congestion, ear pain or sore throat. Denies chest congestion, productive cough or wheezing. Denies chest pains, palpitations and leg swelling Denies abdominal pain, nausea, vomiting,diarrhea or constipation.   Chronic urinary  incontinence.managed by Urology Denies depression, anxiety or insomnia. Denies skin break down or rash.   PE  BP (!) 150/70   Pulse 72   Resp 16   Ht '5\' 7"'$  (1.702 m)   Wt 211 lb 12.8 oz (96.1 kg)   SpO2 98%   BMI 33.17 kg/m   Patient alert and oriented and in no cardiopulmonary distress.anxious and in pain  HEENT: No facial asymmetry, EOMI,     Neck decreased ROm with bilateral trapezius spasm  Chest: Clear to auscultation bilaterally.  CVS: S1, S2 no murmurs, no S3.Regular rate.  ABD: Soft non tender.   Ext: No edema  MS: Adequate though reduced ROM spine, shoulders, hips and knees.  Skin: Intact, no ulcerations or rash noted.  Psych: Good eye contact, normal affect. Memory intact anxious  CNS: CN 2-12 intact, power,  normal throughout.no focal deficits noted.   Assessment & Plan  Neck pain 5 day course of prednisone is prescribed  Anxiety Increased due to current debilitating neck pain  T2DM (type 2 diabetes mellitus) (HCC) Uncontrolled , discussed the possibility of high blood sugar while taking prednisone, she is to call in with concerns, will f/u with PCP in 2 weeks  Elevated blood pressure reading in office without diagnosis of  hypertension DASH diet and commitment to daily physical activity for a minimum of 30 minutes discussed and encouraged, as a part of hypertension management. The importance of attaining a healthy weight is also discussed. To be followed up by PCP     07/12/2022   11:19 AM 07/11/2022    2:26 PM 07/05/2022   10:49 AM 06/28/2022    1:08 PM 06/21/2022    9:07 AM 05/22/2022    1:16 PM 05/14/2022   10:23 AM  BP/Weight  Systolic BP 563 149 702 637 858 850 277  Diastolic BP 71 70 89 82 73 62 65  Wt. (Lbs) 212 211.8  220 217 219.6   BMI 33.2 kg/m2 33.17 kg/m2  34.98 kg/m2 34.5 kg/m2 35.44 kg/m2

## 2022-07-16 NOTE — Assessment & Plan Note (Signed)
5 day course of prednisone is prescribed

## 2022-07-16 NOTE — Assessment & Plan Note (Signed)
DASH diet and commitment to daily physical activity for a minimum of 30 minutes discussed and encouraged, as a part of hypertension management. The importance of attaining a healthy weight is also discussed. To be followed up by PCP     07/12/2022   11:19 AM 07/11/2022    2:26 PM 07/05/2022   10:49 AM 06/28/2022    1:08 PM 06/21/2022    9:07 AM 05/22/2022    1:16 PM 05/14/2022   10:23 AM  BP/Weight  Systolic BP 483 073 543 014 840 397 953  Diastolic BP 71 70 89 82 73 62 65  Wt. (Lbs) 212 211.8  220 217 219.6   BMI 33.2 kg/m2 33.17 kg/m2  34.98 kg/m2 34.5 kg/m2 35.44 kg/m2

## 2022-07-19 ENCOUNTER — Ambulatory Visit (INDEPENDENT_AMBULATORY_CARE_PROVIDER_SITE_OTHER): Payer: 59 | Admitting: Physician Assistant

## 2022-07-19 ENCOUNTER — Ambulatory Visit (INDEPENDENT_AMBULATORY_CARE_PROVIDER_SITE_OTHER): Payer: 59 | Admitting: Family Medicine

## 2022-07-19 ENCOUNTER — Encounter: Payer: Self-pay | Admitting: Family Medicine

## 2022-07-19 DIAGNOSIS — N3941 Urge incontinence: Secondary | ICD-10-CM | POA: Diagnosis not present

## 2022-07-19 DIAGNOSIS — K3184 Gastroparesis: Secondary | ICD-10-CM | POA: Diagnosis not present

## 2022-07-19 MED ORDER — METOCLOPRAMIDE HCL 5 MG PO TABS: 5.0000 mg | ORAL_TABLET | Freq: Four times a day (QID) | ORAL | 0 refills | Status: DC

## 2022-07-19 NOTE — Progress Notes (Signed)
Virtual Visit via Telephone Note   This visit type was conducted due to national recommendations for restrictions regarding the COVID-19 Pandemic (e.g. social distancing) in an effort to limit this patient's exposure and mitigate transmission in our community.  Due to her co-morbid illnesses, this patient is at least at moderate risk for complications without adequate follow up.  This format is felt to be most appropriate for this patient at this time.  The patient did not have access to video technology/had technical difficulties with video requiring transitioning to audio format only (telephone).  All issues noted in this document were discussed and addressed.  No physical exam could be performed with this format.  Please refer to the patient's chart for her  consent to telehealth for [facility].   Evaluation Performed:  Follow-up visit  Date:  07/19/2022   ID:  Angelica, [name] 03-10-1951, MRN [id number]  Patient Location: Home Provider Location: Office/Clinic  Participants: Patient Location of Patient: Home Location of Provider: Telehealth Consent was obtain for visit to be over via telehealth. I verified that I am speaking with the correct person using two identifiers.  PCP:  Alvira Monday, FNP   Chief Complaint:  Nausea, vomiting and diarrhea  History of Present Illness:    [name] is a 71 y.o. female with c/o of nausea, vomiting and diarrhe for 1 day. She notes that her symptoms have subsided, and today, she c/o of dizziness and blurred vision. She denies headaches and the 3Ps of diabetes. Highest BS: 356 and lowest BS:197. BS this morning is 215. She does follow-up with her endocrinologist.   The patient does not have symptoms concerning for COVID-19 infection (fever, chills, cough, or new shortness of breath).   Past Medical, Surgical, Social History, Allergies, and Medications have been Reviewed.  Past Medical History:  Diagnosis Date   Anemia     as a teenager   Anxiety    Arthritis    knees, rt hand,hips   Cancer (HCC)    vaginal,uterine,ovariam   COPD (chronic obstructive pulmonary disease) (HCC)    Depression    Diabetes mellitus without complication (HCC)    Hypothyroidism    Neuromuscular disorder ([place])    hands and feet   Past Surgical History:  Procedure Laterality Date   ABDOMINAL HYSTERECTOMY     BREAST SURGERY Right 1988   lumpectomy   EYE SURGERY Bilateral    FRACTURE SURGERY     Rt ankle,lt knee cap,lt shoulder,rt wrist,rt shoulder,   TONSILLECTOMY     at age 7   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 Breakfast & bedtime   Apple Cider Vinegar 300 MG TABS Take by mouth.   aspirin 81 MG chewable tablet Chew 1 tablet (81 mg total) by mouth 2 (two) times daily. (Patient taking differently: Chew 81 mg by mouth daily.)   atorvastatin (LIPITOR) 20 MG tablet Take 1 tablet (20 mg total) by mouth at bedtime.   cholecalciferol (VITAMIN D3) 25 MCG (1000 UNIT) tablet Take 1,000 Units by mouth daily.   clonazePAM (KLONOPIN) 0.5 MG tablet Take 1 tablet (0.5 mg total) by mouth at bedtime.   donepezil (ARICEPT) 10 MG tablet Take 1 tablet (  10 mg total) by mouth daily.   EPINEPHrine 0.3 mg/0.3 mL IJ SOAJ injection Inject 0.3 mg into the muscle as needed for anaphylaxis.   escitalopram (LEXAPRO) 20 MG tablet Take 1 tablet (20 mg total) by mouth daily.   glucose blood (TRUE METRIX BLOOD GLUCOSE TEST) test strip Use 4 times daily as instructed   insulin glargine (LANTUS SOLOSTAR) 100 UNIT/ML Solostar Pen Inject 25 Units into the skin 2 (two) times daily. Inject 50 units subcutaneously with breakfast & 40 units subcutaneously after supper   insulin lispro (HUMALOG) 100  UNIT/ML KwikPen Inject 5-8 Units into the skin 3 (three) times daily.   Insulin Pen Needle (BD AUTOSHIELD DUO) 30G X 5 MM MISC Use 4 times daily as instructed   levothyroxine (SYNTHROID) 112 MCG tablet Take 1 tablet (112 mcg total) by mouth daily.   loperamide (IMODIUM) 2 MG capsule Take 2 mg by mouth See admin instructions. Take 2 mg PO after each loose stool as needed for diarrhea (max of 3 doses in 24 hours)   methocarbamol (ROBAXIN) 500 MG tablet Take 500 mg by mouth 2 (two) times daily.   metoCLOPramide (REGLAN) 5 MG tablet Take 1 tablet (5 mg total) by mouth 4 (four) times daily.   nystatin cream (MYCOSTATIN) Apply 1 Application topically See admin instructions. Apply to skin folds, under breasts, abdominal folds, and inguinal folds twice a day for candidasis   ondansetron (ZOFRAN) 4 MG tablet Take 1 tablet (4 mg total) by mouth every 6 (six) hours as needed.   prazosin (MINIPRESS) 2 MG capsule Take 1 capsule (2 mg total) by mouth at bedtime.   predniSONE (DELTASONE) 20 MG tablet Take 1 tablet (20 mg total) by mouth 2 (two) times daily with a meal.   Specialty Vitamins Products (COLLAGEN ULTRA PO) Take by mouth.   traZODone (DESYREL) 50 MG tablet Take 2 tablets (100 mg total) by mouth at bedtime.   Vibegron (GEMTESA) 75 MG TABS Take 75 mg by mouth daily.   vitamin B-12 (CYANOCOBALAMIN) 1000 MCG tablet Take 1,000 mcg by mouth daily.     Allergies:   Ambien [zolpidem tartrate], Bee venom, Coconut flavor, Codeine, Onion, Other, and Sulfa antibiotics   ROS:   Please see the history of present illness.     All other systems reviewed and are negative.   Labs/Other Tests and Data Reviewed:    Recent Labs: 05/22/2022: ALT 37; BUN 12; Creatinine, Ser 0.70; Hemoglobin 12.0; Platelets 232; Potassium 4.3; Sodium 143; TSH 0.403   Recent Lipid Panel Lab Results  Component Value Date/Time   CHOL 130 05/22/2022 02:48 PM   TRIG 89 05/22/2022 02:48 PM   HDL 45 05/22/2022 02:48 PM   CHOLHDL 2.9  05/22/2022 02:48 PM   LDLCALC 68 05/22/2022 02:48 PM    Wt Readings from Last 3 Encounters:  07/12/22 212 lb (96.2 kg)  07/11/22 211 lb 12.8 oz (96.1 kg)  06/28/22 220 lb (99.8 kg)     Objective:    Vital Signs:  There were no vitals taken for this visit.     ASSESSMENT & PLAN:   Gastroparesis Reglan ordered, and pt is encouraged to increase her fluid intake to help with her dizziness.  Time:   Today, I have spent 8 minutes reviewing the chart, including problem list, medications, and with the patient with telehealth technology discussing the above problems.   Medication Adjustments/Labs and Tests Ordered: Current medicines are reviewed at length with the patient today.  Concerns regarding medicines are  outlined above.   Tests Ordered: No orders of the defined types were placed in this encounter.   Medication Changes: Meds ordered this encounter  Medications   metoCLOPramide (REGLAN) 5 MG tablet    Sig: Take 1 tablet (5 mg total) by mouth 4 (four) times daily.    Dispense:  30 tablet    Refill:  0           Disposition:  Follow up  Signed, Alvira Monday, FNP  07/19/2022 10:41 AM     [facility]

## 2022-07-19 NOTE — Progress Notes (Signed)
PTNS  Session # 7 of 12  Health & Social Factors: diabetes Caffeine: none Alcohol: none Daytime voids #per day: all day Night-time voids #per night: all night Urgency: yes Incontinence Episodes #per day: always Ankle used: right Treatment Setting: 5 Feeling/ Response: feeling in toes Comments: n/a  Performed By: Levi Aland, CMA  Follow Up: Follow up as scheduled.

## 2022-07-23 ENCOUNTER — Ambulatory Visit: Payer: 59 | Admitting: Family Medicine

## 2022-07-23 ENCOUNTER — Other Ambulatory Visit: Payer: Self-pay | Admitting: Family Medicine

## 2022-07-23 ENCOUNTER — Other Ambulatory Visit (HOSPITAL_COMMUNITY): Payer: Self-pay | Admitting: Family Medicine

## 2022-07-23 DIAGNOSIS — Z1231 Encounter for screening mammogram for malignant neoplasm of breast: Secondary | ICD-10-CM

## 2022-07-23 DIAGNOSIS — G4709 Other insomnia: Secondary | ICD-10-CM

## 2022-07-26 ENCOUNTER — Ambulatory Visit: Payer: 59 | Admitting: Physician Assistant

## 2022-07-26 ENCOUNTER — Ambulatory Visit (INDEPENDENT_AMBULATORY_CARE_PROVIDER_SITE_OTHER): Payer: 59 | Admitting: Urology

## 2022-07-26 ENCOUNTER — Encounter: Payer: Self-pay | Admitting: Nurse Practitioner

## 2022-07-26 ENCOUNTER — Ambulatory Visit (INDEPENDENT_AMBULATORY_CARE_PROVIDER_SITE_OTHER): Payer: 59 | Admitting: Nurse Practitioner

## 2022-07-26 VITALS — BP 140/74 | HR 69 | Ht 67.0 in | Wt 211.0 lb

## 2022-07-26 DIAGNOSIS — R350 Frequency of micturition: Secondary | ICD-10-CM

## 2022-07-26 DIAGNOSIS — E1100 Type 2 diabetes mellitus with hyperosmolarity without nonketotic hyperglycemic-hyperosmolar coma (NKHHC): Secondary | ICD-10-CM

## 2022-07-26 DIAGNOSIS — N3941 Urge incontinence: Secondary | ICD-10-CM

## 2022-07-26 DIAGNOSIS — E119 Type 2 diabetes mellitus without complications: Secondary | ICD-10-CM

## 2022-07-26 DIAGNOSIS — Z794 Long term (current) use of insulin: Secondary | ICD-10-CM

## 2022-07-26 DIAGNOSIS — E039 Hypothyroidism, unspecified: Secondary | ICD-10-CM

## 2022-07-26 MED ORDER — LANTUS SOLOSTAR 100 UNIT/ML ~~LOC~~ SOPN
30.0000 [IU] | PEN_INJECTOR | Freq: Two times a day (BID) | SUBCUTANEOUS | 3 refills | Status: DC
Start: 2022-07-26 — End: 2022-10-24

## 2022-07-26 NOTE — Progress Notes (Signed)
PTNS  Session # 8 of 12  Health & Social Factors: diabetic Caffeine: none Alcohol: none Daytime voids #per day: all day Night-time voids #per night: all night Urgency: yes Incontinence Episodes #per day: always Ankle used: left Treatment Setting: 4 Feeling/ Response: positive sensation f Comments: none  Performed By: [facility] LPN  Follow Up: keep next scheduled PTNS

## 2022-07-26 NOTE — Progress Notes (Signed)
Endocrinology Follow Up Note       07/26/2022, 4:19 PM   Subjective:    Patient ID: [name], female    DOB: 21-Feb-1951.  [name] is being seen in follow up after being seen in consultation for management of currently uncontrolled symptomatic diabetes requested by  Alvira Monday, [place].   Past Medical History:  Diagnosis Date   Anemia    as a teenager   Anxiety    Arthritis    knees, rt hand,hips   Cancer (HCC)    vaginal,uterine,ovariam   COPD (chronic obstructive pulmonary disease) (HCC)    Depression    Diabetes mellitus without complication (HCC)    Hypothyroidism    Neuromuscular disorder ([place])    hands and feet    Past Surgical History:  Procedure Laterality Date   ABDOMINAL HYSTERECTOMY     BREAST SURGERY Right 1988   lumpectomy   EYE SURGERY Bilateral    FRACTURE SURGERY     Rt ankle,lt knee cap,lt shoulder,rt wrist,rt shoulder,   TONSILLECTOMY     at age 33   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   Procedure: TOTAL KNEE ARTHROPLASTY;  Surgeon: Earlie Server, MD;  Location: [facility];  Service: Orthopedics;  Laterality: Right;    Social History   Socioeconomic History   Marital status: Married    Spouse name: Not on file   Number of children: Not on file   Years of education: Not on file   Highest education level: Not on file  Occupational History   Not on file  Tobacco Use   Smoking status: Former    Packs/day: 0.50    Years: 15.00    Total pack years: 7.50    Types: Cigarettes    Quit date: 2013    Years since quitting: 10.5   Smokeless tobacco: Never  Vaping Use   Vaping Use: Never used  Substance and Sexual Activity   Alcohol use: Never   Drug use: Never   Sexual activity: Not on file  Other Topics Concern   Not on file  Social History Narrative   Not on file   Social Determinants of Health   Financial Resource Strain: Not on file  Food  Insecurity: Not on file  Transportation Needs: Not on file  Physical Activity: Not on file  Stress: Not on file  Social Connections: Not on file    Family History  Problem Relation Age of Onset   Cancer Mother    Hypertension Mother    Thyroid disease Mother    Diabetes Mother    Stroke Mother    Heart failure Mother    Diabetes Father    Heart attack Father    Diabetes Sister    Diabetes Brother     Outpatient Encounter Medications as of 07/26/2022  Medication Sig   acyclovir (ZOVIRAX) 400 MG tablet Take 1 tablet (400 mg total) by mouth every 12 (twelve) hours. Breakfast & bedtime   Apple Cider Vinegar 300 MG TABS Take by mouth.   aspirin 81 MG chewable tablet Chew 1 tablet (81 mg total) by mouth 2 (two) times daily. (Patient taking differently: Chew 81 mg by mouth  daily.)   atorvastatin (LIPITOR) 20 MG tablet Take 1 tablet (20 mg total) by mouth at bedtime.   cholecalciferol (VITAMIN D3) 25 MCG (1000 UNIT) tablet Take 1,000 Units by mouth daily.   clonazePAM (KLONOPIN) 0.5 MG tablet Take 1 tablet (0.5 mg total) by mouth at bedtime.   donepezil (ARICEPT) 10 MG tablet Take 1 tablet (10 mg total) by mouth daily.   EPINEPHrine 0.3 mg/0.3 mL IJ SOAJ injection Inject 0.3 mg into the muscle as needed for anaphylaxis.   escitalopram (LEXAPRO) 20 MG tablet Take 1 tablet (20 mg total) by mouth daily.   glucose blood (TRUE METRIX BLOOD GLUCOSE TEST) test strip Use 4 times daily as instructed   insulin glargine (LANTUS SOLOSTAR) 100 UNIT/ML Solostar Pen Inject 30 Units into the skin 2 (two) times daily. Inject 50 units subcutaneously with breakfast & 40 units subcutaneously after supper   insulin lispro (HUMALOG) 100 UNIT/ML KwikPen Inject 5-8 Units into the skin 3 (three) times daily.   Insulin Pen Needle (BD AUTOSHIELD DUO) 30G X 5 MM MISC Use 4 times daily as instructed   levothyroxine (SYNTHROID) 112 MCG tablet Take 1 tablet (112 mcg total) by mouth daily.   loperamide (IMODIUM) 2 MG  capsule Take 2 mg by mouth See admin instructions. Take 2 mg PO after each loose stool as needed for diarrhea (max of 3 doses in 24 hours)   methocarbamol (ROBAXIN) 500 MG tablet Take 500 mg by mouth 2 (two) times daily.   metoCLOPramide (REGLAN) 5 MG tablet Take 1 tablet (5 mg total) by mouth 4 (four) times daily.   nystatin cream (MYCOSTATIN) Apply 1 Application topically See admin instructions. Apply to skin folds, under breasts, abdominal folds, and inguinal folds twice a day for candidasis   ondansetron (ZOFRAN) 4 MG tablet Take 1 tablet (4 mg total) by mouth every 6 (six) hours as needed.   prazosin (MINIPRESS) 2 MG capsule Take 1 capsule (2 mg total) by mouth at bedtime.   predniSONE (DELTASONE) 20 MG tablet Take 1 tablet (20 mg total) by mouth 2 (two) times daily with a meal.   Specialty Vitamins Products (COLLAGEN ULTRA PO) Take by mouth.   traZODone (DESYREL) 50 MG tablet TAKE 2 TABLETS BY MOUTH AT BEDTIME   Vibegron (GEMTESA) 75 MG TABS Take 75 mg by mouth daily.   vitamin B-12 (CYANOCOBALAMIN) 1000 MCG tablet Take 1,000 mcg by mouth daily.   [DISCONTINUED] insulin glargine (LANTUS SOLOSTAR) 100 UNIT/ML Solostar Pen Inject 25 Units into the skin 2 (two) times daily. Inject 50 units subcutaneously with breakfast & 40 units subcutaneously after supper   No facility-administered encounter medications on file as of 07/26/2022.    ALLERGIES: Allergies  Allergen Reactions   Ambien [Zolpidem Tartrate] Other (See Comments)    Hallucinations/nightmares   Bee Venom Swelling   Coconut Flavor Swelling    Mouth swells   Codeine Nausea And Vomiting   Onion Nausea Only and Other (See Comments)    Raw onions   Other     Ground Beef (patient avoid due to side effect of diarrhea)   Sulfa Antibiotics Rash and Other (See Comments)    Including topical sulfa  (mouth dries/peels/skin blisters)    VACCINATION STATUS: Immunization History  Administered Date(s) Administered   Tdap 05/29/2005,  05/22/2022   Zoster Recombinat (Shingrix) 05/22/2022    Diabetes She presents for her follow-up diabetic visit. She has type 2 diabetes mellitus. Onset time: Diagnosed at approx age of 51. Her disease course  has been fluctuating. There are no hypoglycemic associated symptoms. Associated symptoms include foot paresthesias. There are no hypoglycemic complications. Symptoms are stable. There are no diabetic complications. [name] factors for coronary artery disease include diabetes mellitus, family history, obesity, sedentary lifestyle and post-menopausal. Current diabetic treatment includes intensive insulin program. She is compliant with treatment most of the time. Her weight is fluctuating minimally. She is following a generally healthy diet. Meal planning includes avoidance of concentrated sweets. She has not had a previous visit with a dietitian. She participates in exercise daily. Her home blood glucose trend is fluctuating minimally. Her overall blood glucose range is 180-200 mg/dl. (She presents today with her meter, CGM and logs showing above target glycemic profile overall.  She was not due for another A1c today.  She has had trouble getting her CGM to stick, therefore has not been using the CGM optimally.  ) An ACE inhibitor/angiotensin II receptor blocker is being taken. She sees a podiatrist.Eye exam is current.    Review of systems  Constitutional: + Minimally fluctuating body weight, current Body mass index is 33.05 kg/m., no fatigue, no subjective hyperthermia, no subjective hypothermia Eyes: no blurry vision, no xerophthalmia ENT: no sore throat, no nodules palpated in throat, no dysphagia/odynophagia, no hoarseness Cardiovascular: no chest pain, no shortness of breath, no palpitations, no leg swelling Respiratory: no cough, no shortness of breath Gastrointestinal: no nausea/vomiting/diarrhea Musculoskeletal: no muscle/joint aches Skin: no rashes, no hyperemia Neurological: no tremors,  + numbness/tingling to BLE and bilateral hands, no dizziness Psychiatric: no depression, no anxiety  Objective:     BP (!) 140/74   Pulse 69   Ht '5\' 7"'$  (1.702 m)   Wt 211 lb (95.7 kg)   BMI 33.05 kg/m   Wt Readings from Last 3 Encounters:  07/26/22 211 lb (95.7 kg)  07/12/22 212 lb (96.2 kg)  07/11/22 211 lb 12.8 oz (96.1 kg)     BP Readings from Last 3 Encounters:  07/26/22 (!) 140/74  07/12/22 (!) 167/71  07/11/22 (!) 150/70      Physical Exam- Limited  Constitutional:  Body mass index is 33.05 kg/m. , not in acute distress, normal state of mind Eyes:  EOMI, no exophthalmos Neck: Supple Cardiovascular: RRR, no murmurs, rubs, or gallops, no edema Respiratory: Adequate breathing efforts, no crackles, rales, rhonchi, or wheezing Musculoskeletal: no gross deformities, strength intact in all four extremities, no gross restriction of joint movements Skin:  no rashes, no hyperemia Neurological: no tremor with outstretched hands    CMP ( most recent) CMP     Component Value Date/Time   NA 143 05/22/2022 1448   K 4.3 05/22/2022 1448   CL 102 05/22/2022 1448   CO2 25 05/22/2022 1448   GLUCOSE 103 (H) 05/22/2022 1448   GLUCOSE 102 (H) 04/25/2021 1343   BUN 12 05/22/2022 1448   CREATININE 0.70 05/22/2022 1448   CALCIUM 9.4 05/22/2022 1448   PROT 6.7 05/22/2022 1448   ALBUMIN 4.1 05/22/2022 1448   AST 34 05/22/2022 1448   ALT 37 (H) 05/22/2022 1448   ALKPHOS 49 05/22/2022 1448   BILITOT 0.5 05/22/2022 1448   GFRNONAA >60 04/25/2021 1343   GFRAA >60 09/12/2019 0239     Diabetic Labs (most recent): Lab Results  Component Value Date   HGBA1C 7.7 (H) 05/22/2022   HGBA1C 7.4 (H) 09/03/2019   HGBA1C  12/29/2008    6.0 (NOTE)   The ADA recommends the following therapeutic goal for glycemic   control related to  Hgb A1C measurement:   Goal of Therapy:   < 7.0% Hgb A1C   Reference: American Diabetes Association: Clinical Practice   Recommendations 2008, Diabetes Care,   2008, 31:(Suppl 1).     Lipid Panel ( most recent) Lipid Panel     Component Value Date/Time   CHOL 130 05/22/2022 1448   TRIG 89 05/22/2022 1448   HDL 45 05/22/2022 1448   CHOLHDL 2.9 05/22/2022 1448   LDLCALC 68 05/22/2022 1448   LABVLDL 17 05/22/2022 1448      Lab Results  Component Value Date   TSH 0.403 (L) 05/22/2022   FREET4 1.52 05/22/2022           Assessment & Plan:   1) Type 2 diabetes mellitus without complication, with long-term current use of insulin ([place])  She presents today with her meter, CGM and logs showing above target glycemic profile overall.  She was not due for another A1c today.  She has had trouble getting her CGM to stick, therefore has not been using the CGM optimally.    - [name] has currently uncontrolled symptomatic type 2 DM since 71 years of age, with most recent A1c of 7.7 %.   -Recent labs reviewed.  - I had a long discussion with her about the progressive nature of diabetes and the pathology behind its complications. -her diabetes is not currently complicated but she remains at a high [name] for more acute and chronic complications which include CAD, CVA, CKD, retinopathy, and neuropathy. These are all discussed in detail with her.  The following Lifestyle Medicine recommendations according to [facility]) were discussed and offered to patient and she agrees to start the journey:  A. Whole Foods, Plant-based plate comprising of fruits and vegetables, plant-based proteins, whole-grain carbohydrates was discussed in detail with the patient.   A list for source of those nutrients were also provided to the patient.  Patient will use only water or unsweetened tea for hydration. B.  The need to stay away from risky substances including alcohol, smoking; obtaining 7 to 9 hours of restorative sleep, at least 150 minutes of moderate intensity exercise weekly, the importance of healthy social connections,   and stress reduction techniques were discussed. C.  A full color page of  Calorie density of various food groups per pound showing examples of each food groups was provided to the patient.  - Nutritional counseling repeated at each appointment due to patients tendency to fall back in to old habits.  - The patient admits there is a room for improvement in their diet and drink choices. -  Suggestion is made for the patient to avoid simple carbohydrates from their diet including Cakes, Sweet Desserts / Pastries, Ice Cream, Soda (diet and regular), Sweet Tea, Candies, Chips, Cookies, Sweet Pastries, Store Bought Juices, Alcohol in Excess of 1-2 drinks a day, Artificial Sweeteners, Coffee Creamer, and "Sugar-free" Products. This will help patient to have stable blood glucose profile and potentially avoid unintended weight gain.   - I encouraged the patient to switch to unprocessed or minimally processed complex starch and increased protein intake (animal or plant source), fruits, and vegetables.   - Patient is advised to stick to a routine mealtimes to eat 3 meals a day and avoid unnecessary snacks (to snack only to correct hypoglycemia).  - I have approached her with the following individualized plan to manage her diabetes and patient agrees:   -Based on her hyperglycemia overall, she is  advised to increase her Lantus to 35 units SQ nightly and continue her Humalog 5-8 units TID with meals if glucose is above 90 and she is eating (Specific instructions on how to titrate insulin dosage based on glucose readings given to patient in writing).   -she is encouraged to continue monitoring glucose 4 times daily (using her CGM), before meals and before bed, and to call the clinic if she has readings less than 70 or above 300 for 3 tests in a row.    - she is warned not to take insulin without proper monitoring per orders. - Adjustment parameters are given to her for hypo and hyperglycemia in writing.  - she  is not a candidate for sulfa meds due to allergy.  - she will be considered for incretin therapy as appropriate next visit.  - Specific targets for  A1c; LDL, HDL, and Triglycerides were discussed with the patient.  2) Blood Pressure /Hypertension:  her blood pressure is controlled to target without the use of antihypertensive medications.  3) Lipids/Hyperlipidemia:    Review of her recent lipid panel from 05/22/22 showed controlled LDL at 68 .  she is advised to continue Lipitor 20 mg daily at bedtime.  Side effects and precautions discussed with her.  4)  Weight/Diet:  her Body mass index is 33.05 kg/m.  -  clearly complicating her diabetes care.   she is a candidate for weight loss. I discussed with her the fact that loss of 5 - 10% of her  current body weight will have the most impact on her diabetes management.  Exercise, and detailed carbohydrates information provided  -  detailed on discharge instructions.  5) Hypothyroidism-unspecified Her most recent TFTs are consistent with appropriate hormone replacement.  She is advised to continue Levothyroxine 112 mcg po daily before breakfast.   - The correct intake of thyroid hormone (Levothyroxine, Synthroid), is on empty stomach first thing in the morning, with water, separated by at least 30 minutes from breakfast and other medications,  and separated by more than 4 hours from calcium, iron, multivitamins, acid reflux medications (PPIs).  - This medication is a life-long medication and will be needed to correct thyroid hormone imbalances for the rest of your life.  The dose may change from time to time, based on thyroid blood work.  - It is extremely important to be consistent taking this medication, near the same time each morning.  -AVOID TAKING PRODUCTS CONTAINING BIOTIN (commonly found in Hair, Skin, Nails vitamins) AS IT INTERFERES WITH THE VALIDITY OF THYROID FUNCTION BLOOD TESTS.  6) Chronic Care/Health Maintenance: -she is not on  ACEI/ARB and is on Statin medications and is encouraged to initiate and continue to follow up with Ophthalmology, Dentist, Podiatrist at least yearly or according to recommendations, and advised to stay away from smoking. I have recommended yearly flu vaccine and pneumonia vaccine at least every 5 years; moderate intensity exercise for up to 150 minutes weekly; and sleep for at least 7 hours a day.  - she is advised to maintain close follow up with Alvira Monday, FNP for primary care needs, as well as her other providers for optimal and coordinated care.     I spent 30 minutes in the care of the patient today including review of labs from [place], Lipids, Thyroid Function, Hematology (current and previous including abstractions from other facilities); face-to-face time discussing  her blood glucose readings/logs, discussing hypoglycemia and hyperglycemia episodes and symptoms, medications doses, her options of short  and long term treatment based on the latest standards of care / guidelines;  discussion about incorporating lifestyle medicine;  and documenting the encounter. [name] reduction counseling performed per USPSTF guidelines to reduce obesity and cardiovascular [name] factors.     Please refer to Patient Instructions for Blood Glucose Monitoring and Insulin/Medications Dosing Guide"  in media tab for additional information. Please  also refer to " Patient Self Inventory" in the Media  tab for reviewed elements of pertinent patient history.  [name] participated in the discussions, expressed understanding, and voiced agreement with the above plans.  All questions were answered to her satisfaction. she is encouraged to contact clinic should she have any questions or concerns prior to her return visit.     Follow up plan: - Return in about 1 month (around 08/26/2022) for Diabetes F/U with A1c in office, No previsit labs, Bring meter and logs.   Rayetta Pigg, [facility]  [facility] [address] [place], [place] [address] Phone: [phone] Fax: [phone]  07/26/2022, 4:19 PM

## 2022-07-26 NOTE — Patient Instructions (Signed)

## 2022-07-31 ENCOUNTER — Encounter: Payer: Self-pay | Admitting: Family Medicine

## 2022-07-31 ENCOUNTER — Ambulatory Visit (INDEPENDENT_AMBULATORY_CARE_PROVIDER_SITE_OTHER): Payer: 59 | Admitting: Family Medicine

## 2022-07-31 VITALS — BP 120/70 | HR 67 | Ht 66.0 in | Wt 209.0 lb

## 2022-07-31 DIAGNOSIS — R030 Elevated blood-pressure reading, without diagnosis of hypertension: Secondary | ICD-10-CM | POA: Diagnosis not present

## 2022-07-31 DIAGNOSIS — M542 Cervicalgia: Secondary | ICD-10-CM | POA: Diagnosis not present

## 2022-07-31 NOTE — Progress Notes (Addendum)
Established Patient Office Visit  Subjective:  Patient ID: [name], female    DOB: Dec 25, 1950  Age: 71 y.o. MRN: [id number]  CC:  Chief Complaint  Patient presents with   Follow-up    Pt following up, c/o neck pain still going on would like a refill on what was last prescribed. C/o leg swelling on left leg.     HPI [name] is a 71 y.o. female with past medical history of Neck pain  and leg swelling presents for f/u of  elevated BP.  Elevated BP: controlled today. Patient noted to have normal readings when she checks her BP. No headaches, dizziness or blurred vision noted.    Past Medical History:  Diagnosis Date   Anemia    as a teenager   Anxiety    Arthritis    knees, rt hand,hips   Cancer (HCC)    vaginal,uterine,ovariam   COPD (chronic obstructive pulmonary disease) (HCC)    Depression    Diabetes mellitus without complication (HCC)    Hypothyroidism    Neuromuscular disorder ([place])    hands and feet    Past Surgical History:  Procedure Laterality Date   ABDOMINAL HYSTERECTOMY     BREAST SURGERY Right 1988   lumpectomy   EYE SURGERY Bilateral    FRACTURE SURGERY     Rt ankle,lt knee cap,lt shoulder,rt wrist,rt shoulder,   TONSILLECTOMY     at age 39   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   Procedure: TOTAL KNEE ARTHROPLASTY;  Surgeon: Earlie Server, MD;  Location: [facility];  Service: Orthopedics;  Laterality: Right;    Family History  Problem Relation Age of Onset   Cancer Mother    Hypertension Mother    Thyroid disease Mother    Diabetes Mother    Stroke Mother    Heart failure Mother    Diabetes Father    Heart attack Father    Diabetes Sister    Diabetes Brother     Social History   Socioeconomic History   Marital status: Married    Spouse name: Not on file   Number of children: Not on file   Years of education: Not on file   Highest education level: Not on file  Occupational History   Not on file  Tobacco Use    Smoking status: Former    Packs/day: 0.50    Years: 15.00    Total pack years: 7.50    Types: Cigarettes    Quit date: 2013    Years since quitting: 10.6   Smokeless tobacco: Never  Vaping Use   Vaping Use: Never used  Substance and Sexual Activity   Alcohol use: Never   Drug use: Never   Sexual activity: Not on file  Other Topics Concern   Not on file  Social History Narrative   Not on file   Social Determinants of Health   Financial Resource Strain: Not on file  Food Insecurity: Not on file  Transportation Needs: Not on file  Physical Activity: Not on file  Stress: Not on file  Social Connections: Not on file  Intimate Partner Violence: Not on file    Outpatient Medications Prior to Visit  Medication Sig Dispense Refill   Apple Cider Vinegar 300 MG TABS Take by mouth.     aspirin 81 MG chewable tablet Chew 1 tablet (81 mg total) by mouth 2 (two) times daily. 60 tablet 0   atorvastatin (LIPITOR) 20 MG tablet Take 1  tablet (20 mg total) by mouth at bedtime. 90 tablet 1   cholecalciferol (VITAMIN D3) 25 MCG (1000 UNIT) tablet Take 1,000 Units by mouth daily.     donepezil (ARICEPT) 10 MG tablet Take 1 tablet (10 mg total) by mouth daily. 90 tablet 1   EPINEPHrine 0.3 mg/0.3 mL IJ SOAJ injection Inject 0.3 mg into the muscle as needed for anaphylaxis. 1 each 2   insulin glargine (LANTUS SOLOSTAR) 100 UNIT/ML Solostar Pen Inject 30 Units into the skin 2 (two) times daily. Inject 50 units subcutaneously with breakfast & 40 units subcutaneously after supper 45 mL 3   insulin lispro (HUMALOG) 100 UNIT/ML KwikPen Inject 5-8 Units into the skin 3 (three) times daily.     Insulin Pen Needle (BD AUTOSHIELD DUO) 30G X 5 MM MISC Use 4 times daily as instructed 100 each 2   levothyroxine (SYNTHROID) 112 MCG tablet Take 1 tablet (112 mcg total) by mouth daily. 90 tablet 1   loperamide (IMODIUM) 2 MG capsule Take 2 mg by mouth See admin instructions. Take 2 mg PO after each loose stool as  needed for diarrhea (max of 3 doses in 24 hours)     nystatin cream (MYCOSTATIN) Apply 1 Application topically See admin instructions. Apply to skin folds, under breasts, abdominal folds, and inguinal folds twice a day for candidasis 30 g 1   ondansetron (ZOFRAN) 4 MG tablet Take 1 tablet (4 mg total) by mouth every 6 (six) hours as needed. 30 tablet 2   prazosin (MINIPRESS) 2 MG capsule Take 1 capsule (2 mg total) by mouth at bedtime. 90 capsule 1   predniSONE (DELTASONE) 20 MG tablet Take 1 tablet (20 mg total) by mouth 2 (two) times daily with a meal. 10 tablet 0   Specialty Vitamins Products (COLLAGEN ULTRA PO) Take by mouth.     Tetrahydrozoline-Zn Sulfate (EYE DROPS AR OP) Apply to eye.     traZODone (DESYREL) 50 MG tablet TAKE 2 TABLETS BY MOUTH AT BEDTIME 30 tablet 2   Vibegron (GEMTESA) 75 MG TABS Take 75 mg by mouth daily. 28 tablet 0   vitamin B-12 (CYANOCOBALAMIN) 1000 MCG tablet Take 1,000 mcg by mouth daily.     acyclovir (ZOVIRAX) 400 MG tablet Take 1 tablet (400 mg total) by mouth every 12 (twelve) hours. Breakfast & bedtime 90 tablet 1   clonazePAM (KLONOPIN) 0.5 MG tablet Take 1 tablet (0.5 mg total) by mouth at bedtime. 30 tablet 0   escitalopram (LEXAPRO) 20 MG tablet Take 1 tablet (20 mg total) by mouth daily. 30 tablet 2   glucose blood (TRUE METRIX BLOOD GLUCOSE TEST) test strip Use 4 times daily as instructed 100 each 2   methocarbamol (ROBAXIN) 500 MG tablet Take 500 mg by mouth 2 (two) times daily.     metoCLOPramide (REGLAN) 5 MG tablet Take 1 tablet (5 mg total) by mouth 4 (four) times daily. 30 tablet 0   No facility-administered medications prior to visit.    Allergies  Allergen Reactions   Ambien [Zolpidem Tartrate] Other (See Comments)    Hallucinations/nightmares   Bee Venom Swelling   Coconut Flavor Swelling    Mouth swells   Codeine Nausea And Vomiting   Onion Nausea Only and Other (See Comments)    Raw onions   Other     Ground Beef (patient avoid  due to side effect of diarrhea)   Sulfa Antibiotics Rash and Other (See Comments)    Including topical sulfa  (mouth dries/peels/skin  blisters)    ROS Review of Systems  Constitutional:  Negative for fatigue and fever.  Cardiovascular:  Negative for chest pain and palpitations.  Neurological:  Negative for dizziness, weakness, light-headedness, numbness and headaches.      Objective:    Physical Exam HENT:     Head: Normocephalic.     Right Ear: External ear normal.     Left Ear: External ear normal.  Cardiovascular:     Rate and Rhythm: Normal rate and regular rhythm.     Pulses: Normal pulses.     Heart sounds: Normal heart sounds.  Pulmonary:     Effort: Pulmonary effort is normal.     Breath sounds: Normal breath sounds.  Musculoskeletal:     Right lower leg: No edema.     Left lower leg: No edema.  Neurological:     Mental Status: She is alert.     BP 120/70   Pulse 67   Ht _0  (1.676 m)   Wt 209 lb (94.8 kg)   SpO2 94%   BMI 33.73 kg/m  Wt Readings from Last 3 Encounters:  08/22/22 206 lb 1.9 oz (93.5 kg)  07/31/22 209 lb (94.8 kg)  07/26/22 211 lb (95.7 kg)    Lab Results  Component Value Date   TSH 0.403 (L) 05/22/2022   Lab Results  Component Value Date   WBC 5.3 05/22/2022   HGB 12.0 05/22/2022   HCT 35.8 05/22/2022   MCV 83 05/22/2022   PLT 232 05/22/2022   Lab Results  Component Value Date   NA 143 05/22/2022   K 4.3 05/22/2022   CO2 25 05/22/2022   GLUCOSE 103 (H) 05/22/2022   BUN 12 05/22/2022   CREATININE 0.70 05/22/2022   BILITOT 0.5 05/22/2022   ALKPHOS 49 05/22/2022   AST 34 05/22/2022   ALT 37 (H) 05/22/2022   PROT 6.7 05/22/2022   ALBUMIN 4.1 05/22/2022   CALCIUM 9.4 05/22/2022   ANIONGAP 8 04/25/2021   EGFR 92 05/22/2022   Lab Results  Component Value Date   CHOL 130 05/22/2022   Lab Results  Component Value Date   HDL 45 05/22/2022   Lab Results  Component Value Date   LDLCALC 68 05/22/2022   Lab  Results  Component Value Date   TRIG 89 05/22/2022   Lab Results  Component Value Date   CHOLHDL 2.9 05/22/2022   Lab Results  Component Value Date   HGBA1C 7.7 (H) 05/22/2022      Assessment & Plan:   Problem List Items Addressed This Visit       Other   Neck pain    Completed 5 days course of prednisone C/o of mild neck pain Encouraged conservative managements and supportive treatments Recommended low sodium diet and leg elevation for relieve swelling in her left leg      Elevated blood pressure reading in office without diagnosis of hypertension - Primary    BP is 120/70 Patient noted to have normal readings when she checks her BP Denies occipital headaches, dizziness, and blurred vision She admits to high sodium diet with minimum physical activities Encouraged decreasing her sodium intake  Encouraged heart-healthy diet and increased physical activities Recommended monitoring BP daily and bringing ambulatory readings at her next appointment Reviewed Dash's eating plan Rncouraged exercising X3 a week  for 30 mins          No orders of the defined types were placed in this encounter.   Follow-up: Return  in about 22 days (around 08/22/2022) for BP and annual.    Alvira Monday, FNP

## 2022-07-31 NOTE — Assessment & Plan Note (Addendum)
Completed 5 days course of prednisone C/o of mild neck pain Encouraged conservative managements and supportive treatments Recommended low sodium diet and leg elevation for relieve swelling in her left leg

## 2022-07-31 NOTE — Assessment & Plan Note (Addendum)
BP is 120/70 Patient noted to have normal readings when she checks her BP Denies occipital headaches, dizziness, and blurred vision She admits to high sodium diet with minimum physical activities Encouraged decreasing her sodium intake  Encouraged heart-healthy diet and increased physical activities Recommended monitoring BP daily and bringing ambulatory readings at her next appointment Reviewed Dash's eating plan Rncouraged exercising X3 a week  for 30 mins

## 2022-07-31 NOTE — Patient Instructions (Signed)
I appreciate the opportunity to provide care to you today!    Follow up: 08/22/22     Please continue to a heart-healthy diet and increase your physical activities. Try to exercise for 22mns at least three times a week.      It was a pleasure to see you and I look forward to continuing to work together on your health and well-being. Please do not hesitate to call the office if you need care or have questions about your care.   Have a wonderful day and week. With Gratitude, GAlvira MondayMSN, FNP-BC

## 2022-08-01 ENCOUNTER — Telehealth: Payer: Self-pay

## 2022-08-01 ENCOUNTER — Other Ambulatory Visit: Payer: Self-pay

## 2022-08-01 DIAGNOSIS — R030 Elevated blood-pressure reading, without diagnosis of hypertension: Secondary | ICD-10-CM

## 2022-08-01 MED ORDER — BLOOD PRESSURE MONITOR/L CUFF MISC: 0 refills | Status: DC

## 2022-08-01 NOTE — Telephone Encounter (Signed)
Patient called said she must have a prescription for blood pressure cuff and machine before patient insurance will cover.   Pharmacy: [facility]

## 2022-08-01 NOTE — Telephone Encounter (Signed)
Rx sent to pharmacy will see if ins approves if not she will buy otc.

## 2022-08-02 ENCOUNTER — Ambulatory Visit (INDEPENDENT_AMBULATORY_CARE_PROVIDER_SITE_OTHER): Payer: 59 | Admitting: Urology

## 2022-08-02 ENCOUNTER — Ambulatory Visit: Payer: 59 | Admitting: Physician Assistant

## 2022-08-02 DIAGNOSIS — N3941 Urge incontinence: Secondary | ICD-10-CM

## 2022-08-02 NOTE — Progress Notes (Signed)
PTNS  Session # 9  Health & Social Factors: Diabetes Caffeine: 0 Alcohol: 0 Daytime voids #per day: All day Night-time voids #per night: All night Urgency: Always Incontinence Episodes #per day: Always Ankle used: Left Treatment Setting: 4 Feeling/ Response: Strong Comments: None  Performed By: Marisue Brooklyn, CMA  Follow Up: Weekly PTNS

## 2022-08-03 ENCOUNTER — Encounter: Payer: Self-pay | Admitting: Urology

## 2022-08-03 NOTE — Progress Notes (Signed)
PTNS  Session # 9  Health & Social Factors: Diabetes Caffeine: 0 Alcohol: 0 Daytime voids #per day: All day Night-time voids #per night: All night Urgency: Always Incontinence Episodes #per day: Always Ankle used: Left Treatment Setting: 4 Feeling/ Response: Strong Comments: None  Performed By: Marisue Brooklyn, CMA  Follow Up: Weekly PTNS

## 2022-08-06 ENCOUNTER — Other Ambulatory Visit: Payer: Self-pay

## 2022-08-06 ENCOUNTER — Telehealth: Payer: Self-pay | Admitting: Family Medicine

## 2022-08-06 ENCOUNTER — Other Ambulatory Visit: Payer: Self-pay | Admitting: Family Medicine

## 2022-08-06 DIAGNOSIS — F419 Anxiety disorder, unspecified: Secondary | ICD-10-CM

## 2022-08-06 DIAGNOSIS — K3184 Gastroparesis: Secondary | ICD-10-CM

## 2022-08-06 DIAGNOSIS — M542 Cervicalgia: Secondary | ICD-10-CM

## 2022-08-06 MED ORDER — CLONAZEPAM 0.5 MG PO TABS: 0.5000 mg | ORAL_TABLET | Freq: Every day | ORAL | 0 refills | Status: DC

## 2022-08-06 MED ORDER — METHOCARBAMOL 500 MG PO TABS: 500.0000 mg | ORAL_TABLET | Freq: Two times a day (BID) | ORAL | 0 refills | Status: AC

## 2022-08-06 NOTE — Telephone Encounter (Signed)
Pt called stating her phar told her to contact us for these refills? She is completely out of these medications  clonazePAM (KLONOPIN) 0.5 MG tablet   methocarbamol (ROBAXIN) 500 MG tablet   traZODone (DESYREL) 50 MG tablet  [facility]

## 2022-08-06 NOTE — Telephone Encounter (Signed)
Refills sent verbally approved by provider.

## 2022-08-08 ENCOUNTER — Other Ambulatory Visit: Payer: Self-pay | Admitting: Family Medicine

## 2022-08-08 DIAGNOSIS — F419 Anxiety disorder, unspecified: Secondary | ICD-10-CM

## 2022-08-08 DIAGNOSIS — E1100 Type 2 diabetes mellitus with hyperosmolarity without nonketotic hyperglycemic-hyperosmolar coma (NKHHC): Secondary | ICD-10-CM

## 2022-08-09 ENCOUNTER — Ambulatory Visit: Payer: 59

## 2022-08-09 ENCOUNTER — Ambulatory Visit (INDEPENDENT_AMBULATORY_CARE_PROVIDER_SITE_OTHER): Payer: 59 | Admitting: Physician Assistant

## 2022-08-09 DIAGNOSIS — N3941 Urge incontinence: Secondary | ICD-10-CM | POA: Diagnosis not present

## 2022-08-09 DIAGNOSIS — R350 Frequency of micturition: Secondary | ICD-10-CM

## 2022-08-09 NOTE — Progress Notes (Signed)
PTNS  Session # 10  Health & Social Factors: none Caffeine: none Alcohol: none Daytime voids #per day: all day Night-time voids #per night: all night Urgency: yes Incontinence Episodes #per day: always Ankle used: right Treatment Setting: 6 Feeling/ Response: positive sensation Comments: n/a  Performed By: [facility] LPN  Follow Up: Keep scheduled NV

## 2022-08-15 ENCOUNTER — Ambulatory Visit: Payer: 59

## 2022-08-15 ENCOUNTER — Telehealth: Payer: Self-pay

## 2022-08-15 NOTE — Telephone Encounter (Signed)
Don't [facility] offer transportation?

## 2022-08-15 NOTE — Telephone Encounter (Signed)
Patient called to cancel Thurs.08-23 appt.  No transportation.  Thanks, Helene Kelp

## 2022-08-16 ENCOUNTER — Ambulatory Visit (INDEPENDENT_AMBULATORY_CARE_PROVIDER_SITE_OTHER): Payer: 59 | Admitting: Physician Assistant

## 2022-08-16 ENCOUNTER — Ambulatory Visit: Payer: 59 | Admitting: Physician Assistant

## 2022-08-16 DIAGNOSIS — N3941 Urge incontinence: Secondary | ICD-10-CM | POA: Diagnosis not present

## 2022-08-16 NOTE — Progress Notes (Signed)
PTNS  Session # 11 of 12  Health & Social Factors: Diabetes Caffeine: no Alcohol: no Daytime voids #per day: all day Night-time voids #per night: all night Urgency: yes Incontinence Episodes #per day: always Ankle used: right Treatment Setting: 3 Feeling/ Response: feeling in foot and a leg Comments: n/a  Performed By: Levi Aland, CMA  Follow Up: As scheduled.

## 2022-08-22 ENCOUNTER — Encounter: Payer: Self-pay | Admitting: Family Medicine

## 2022-08-22 ENCOUNTER — Other Ambulatory Visit: Payer: Self-pay | Admitting: Family Medicine

## 2022-08-22 ENCOUNTER — Telehealth: Payer: Self-pay

## 2022-08-22 ENCOUNTER — Ambulatory Visit (INDEPENDENT_AMBULATORY_CARE_PROVIDER_SITE_OTHER): Payer: 59 | Admitting: Family Medicine

## 2022-08-22 VITALS — BP 124/74 | HR 68 | Ht 66.0 in | Wt 206.1 lb

## 2022-08-22 DIAGNOSIS — L84 Corns and callosities: Secondary | ICD-10-CM | POA: Insufficient documentation

## 2022-08-22 DIAGNOSIS — E559 Vitamin D deficiency, unspecified: Secondary | ICD-10-CM

## 2022-08-22 DIAGNOSIS — Z23 Encounter for immunization: Secondary | ICD-10-CM | POA: Diagnosis not present

## 2022-08-22 DIAGNOSIS — F419 Anxiety disorder, unspecified: Secondary | ICD-10-CM

## 2022-08-22 DIAGNOSIS — R030 Elevated blood-pressure reading, without diagnosis of hypertension: Secondary | ICD-10-CM

## 2022-08-22 DIAGNOSIS — E1100 Type 2 diabetes mellitus with hyperosmolarity without nonketotic hyperglycemic-hyperosmolar coma (NKHHC): Secondary | ICD-10-CM

## 2022-08-22 DIAGNOSIS — B009 Herpesviral infection, unspecified: Secondary | ICD-10-CM

## 2022-08-22 DIAGNOSIS — E7849 Other hyperlipidemia: Secondary | ICD-10-CM | POA: Diagnosis not present

## 2022-08-22 DIAGNOSIS — E038 Other specified hypothyroidism: Secondary | ICD-10-CM

## 2022-08-22 DIAGNOSIS — N3941 Urge incontinence: Secondary | ICD-10-CM

## 2022-08-22 DIAGNOSIS — Z794 Long term (current) use of insulin: Secondary | ICD-10-CM

## 2022-08-22 NOTE — Assessment & Plan Note (Signed)
She has a callus under both her feet, for which she sees a podiatrist, Dr.Cody Irving Shows, in [place] Encouraged to follow up with Dr. Steffanie Rainwater for callus removal

## 2022-08-22 NOTE — Assessment & Plan Note (Signed)
Encouraged to continue taking Prozac 20 mg daily

## 2022-08-22 NOTE — Assessment & Plan Note (Signed)
reports following up with urology and takes gemtesa 75 mg with minimum relief of symptoms She has completed 11 of 12 sessions and reports minor relief in her symptoms

## 2022-08-22 NOTE — Patient Instructions (Signed)
I appreciate the opportunity to provide care to you today!    Follow up:  4 months CPE  Labs: please stop by the lab today to get your blood drawn (CBC, CMP, TSH, Lipid profile, HgA1c, Vit D)     Please continue to a heart-healthy diet and increase your physical activities. Try to exercise for 83mns at least three times a week.      It was a pleasure to see you and I look forward to continuing to work together on your health and well-being. Please do not hesitate to call the office if you need care or have questions about your care.   Have a wonderful day and week. With Gratitude, GAlvira MondayMSN, FNP-BC

## 2022-08-22 NOTE — Telephone Encounter (Signed)
Inform pt that Robaxin (methocarbamol) can cause drowsiness and dizziness and patient should avoid tasks requiring complete mental alertness while taking this medication

## 2022-08-22 NOTE — Progress Notes (Signed)
foll

## 2022-08-22 NOTE — Assessment & Plan Note (Addendum)
Encouraged to continue taking synthroid 112 mcg Pending labs

## 2022-08-22 NOTE — Progress Notes (Signed)
Established Patient Office Visit  Subjective:  Patient ID: [name], female    DOB: June 15, 1951  Age: 71 y.o. MRN: [id number]  CC:  Chief Complaint  Patient presents with   Follow-up    3 month follow up, has bp kit with her, would like instructions on how to use it also has dexcom with her.     HPI [name] is a 71 y.o. female with past medical history of T2DM, Urinary incontinenece presents for f/u of  chronic medical conditions. T2DM:  reports following up with her endocrinologist next week. The highest BS is 380, and the lowest is 171.  She reports adherence to the treatment regimen.  Urinary incontinence: reports following up with urology and takes gemtesa 75 mg with minimum relief of symptoms. She has completed 11 of 12 sessions and reports minor relief in her symptoms.  Foot callus: She has a callus under both her feet, for which she sees a podiatrist, Dr.Cody Irving Shows, in [place].    Past Medical History:  Diagnosis Date   Anemia    as a teenager   Anxiety    Arthritis    knees, rt hand,hips   Cancer (HCC)    vaginal,uterine,ovariam   COPD (chronic obstructive pulmonary disease) (HCC)    Depression    Diabetes mellitus without complication (HCC)    Hypothyroidism    Neuromuscular disorder ([place])    hands and feet    Past Surgical History:  Procedure Laterality Date   ABDOMINAL HYSTERECTOMY     BREAST SURGERY Right 1988   lumpectomy   EYE SURGERY Bilateral    FRACTURE SURGERY     Rt ankle,lt knee cap,lt shoulder,rt wrist,rt shoulder,   TONSILLECTOMY     at age 79   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   Procedure: TOTAL KNEE ARTHROPLASTY;  Surgeon: Earlie Server, MD;  Location: [facility];  Service: Orthopedics;  Laterality: Right;    Family History  Problem Relation Age of Onset   Cancer Mother    Hypertension Mother    Thyroid disease Mother    Diabetes Mother    Stroke Mother    Heart failure Mother    Diabetes Father     Heart attack Father    Diabetes Sister    Diabetes Brother     Social History   Socioeconomic History   Marital status: Married    Spouse name: Not on file   Number of children: Not on file   Years of education: Not on file   Highest education level: Not on file  Occupational History   Not on file  Tobacco Use   Smoking status: Former    Packs/day: 0.50    Years: 15.00    Total pack years: 7.50    Types: Cigarettes    Quit date: 2013    Years since quitting: 10.6   Smokeless tobacco: Never  Vaping Use   Vaping Use: Never used  Substance and Sexual Activity   Alcohol use: Never   Drug use: Never   Sexual activity: Not on file  Other Topics Concern   Not on file  Social History Narrative   Not on file   Social Determinants of Health   Financial Resource Strain: Not on file  Food Insecurity: Not on file  Transportation Needs: Not on file  Physical Activity: Not on file  Stress: Not on file  Social Connections: Not on file  Intimate Partner Violence: Not on file  Outpatient Medications Prior to Visit  Medication Sig Dispense Refill   acyclovir (ZOVIRAX) 400 MG tablet TAKE 1 TABLET BY MOUTH EVERY TWELVE HOURS (AT BREAKFAST AND AT BEDTIME) 90 tablet 1   Apple Cider Vinegar 300 MG TABS Take by mouth.     aspirin 81 MG chewable tablet Chew 1 tablet (81 mg total) by mouth 2 (two) times daily. 60 tablet 0   atorvastatin (LIPITOR) 20 MG tablet Take 1 tablet (20 mg total) by mouth at bedtime. 90 tablet 1   Blood Pressure Monitoring (BLOOD PRESSURE MONITOR/L CUFF) MISC Dx: R03.0, elevated bp in office 1 each 0   cholecalciferol (VITAMIN D3) 25 MCG (1000 UNIT) tablet Take 1,000 Units by mouth daily.     clonazePAM (KLONOPIN) 0.5 MG tablet Take 1 tablet (0.5 mg total) by mouth at bedtime. 30 tablet 0   donepezil (ARICEPT) 10 MG tablet Take 1 tablet (10 mg total) by mouth daily. 90 tablet 1   EPINEPHrine 0.3 mg/0.3 mL IJ SOAJ injection Inject 0.3 mg into the muscle as needed  for anaphylaxis. 1 each 2   escitalopram (LEXAPRO) 20 MG tablet TAKE 1 TABLET BY MOUTH EVERY DAY 30 tablet 2   glucose blood (TRUE METRIX BLOOD GLUCOSE TEST) test strip USE AS DIRECTED 100 each 2   insulin glargine (LANTUS SOLOSTAR) 100 UNIT/ML Solostar Pen Inject 30 Units into the skin 2 (two) times daily. Inject 50 units subcutaneously with breakfast & 40 units subcutaneously after supper 45 mL 3   insulin lispro (HUMALOG) 100 UNIT/ML KwikPen Inject 5-8 Units into the skin 3 (three) times daily.     Insulin Pen Needle (BD AUTOSHIELD DUO) 30G X 5 MM MISC Use 4 times daily as instructed 100 each 2   levothyroxine (SYNTHROID) 112 MCG tablet Take 1 tablet (112 mcg total) by mouth daily. 90 tablet 1   loperamide (IMODIUM) 2 MG capsule Take 2 mg by mouth See admin instructions. Take 2 mg PO after each loose stool as needed for diarrhea (max of 3 doses in 24 hours)     metoCLOPramide (REGLAN) 5 MG tablet TAKE 1 TABLET BY MOUTH FOUR TIMES DAILY 30 tablet 0   nystatin cream (MYCOSTATIN) Apply 1 Application topically See admin instructions. Apply to skin folds, under breasts, abdominal folds, and inguinal folds twice a day for candidasis 30 g 1   ondansetron (ZOFRAN) 4 MG tablet Take 1 tablet (4 mg total) by mouth every 6 (six) hours as needed. 30 tablet 2   prazosin (MINIPRESS) 2 MG capsule Take 1 capsule (2 mg total) by mouth at bedtime. 90 capsule 1   predniSONE (DELTASONE) 20 MG tablet Take 1 tablet (20 mg total) by mouth 2 (two) times daily with a meal. 10 tablet 0   Specialty Vitamins Products (COLLAGEN ULTRA PO) Take by mouth.     Tetrahydrozoline-Zn Sulfate (EYE DROPS AR OP) Apply to eye.     traZODone (DESYREL) 50 MG tablet TAKE 2 TABLETS BY MOUTH AT BEDTIME 30 tablet 2   Vibegron (GEMTESA) 75 MG TABS Take 75 mg by mouth daily. 28 tablet 0   vitamin B-12 (CYANOCOBALAMIN) 1000 MCG tablet Take 1,000 mcg by mouth daily.     methocarbamol (ROBAXIN) 500 MG tablet Take 1 tablet (500 mg total) by mouth 2  (two) times daily. (Patient not taking: Reported on 08/22/2022) 30 tablet 0   No facility-administered medications prior to visit.    Allergies  Allergen Reactions   Ambien [Zolpidem Tartrate] Other (See Comments)  Hallucinations/nightmares   Bee Venom Swelling   Coconut Flavor Swelling    Mouth swells   Codeine Nausea And Vomiting   Onion Nausea Only and Other (See Comments)    Raw onions   Other     Ground Beef (patient avoid due to side effect of diarrhea)   Sulfa Antibiotics Rash and Other (See Comments)    Including topical sulfa  (mouth dries/peels/skin blisters)    ROS Review of Systems  Constitutional:  Negative for fatigue and fever.  Endocrine: Negative for cold intolerance and heat intolerance.  Genitourinary:  Negative for frequency, vaginal discharge and vaginal pain.  Skin:  Negative for rash and wound.       Foot callus  Psychiatric/Behavioral:  Negative for self-injury and suicidal ideas.       Objective:    Physical Exam HENT:     Head: Normocephalic.  Cardiovascular:     Rate and Rhythm: Normal rate and regular rhythm.     Pulses: Normal pulses.     Heart sounds: Normal heart sounds.  Pulmonary:     Effort: Pulmonary effort is normal.     Breath sounds: Normal breath sounds.  Skin:    Comments: Thick, hardened layer of skin across the balls of the foot, the outer side of the big toe and the outer edge of the heel  Neurological:     Mental Status: She is alert.     BP 124/74   Pulse 68   Ht _0  (1.676 m)   Wt 206 lb 1.9 oz (93.5 kg)   SpO2 96%   BMI 33.27 kg/m  Wt Readings from Last 3 Encounters:  08/22/22 206 lb 1.9 oz (93.5 kg)  07/31/22 209 lb (94.8 kg)  07/26/22 211 lb (95.7 kg)    Lab Results  Component Value Date   TSH 0.403 (L) 05/22/2022   Lab Results  Component Value Date   WBC 5.3 05/22/2022   HGB 12.0 05/22/2022   HCT 35.8 05/22/2022   MCV 83 05/22/2022   PLT 232 05/22/2022   Lab Results  Component Value Date    NA 143 05/22/2022   K 4.3 05/22/2022   CO2 25 05/22/2022   GLUCOSE 103 (H) 05/22/2022   BUN 12 05/22/2022   CREATININE 0.70 05/22/2022   BILITOT 0.5 05/22/2022   ALKPHOS 49 05/22/2022   AST 34 05/22/2022   ALT 37 (H) 05/22/2022   PROT 6.7 05/22/2022   ALBUMIN 4.1 05/22/2022   CALCIUM 9.4 05/22/2022   ANIONGAP 8 04/25/2021   EGFR 92 05/22/2022   Lab Results  Component Value Date   CHOL 130 05/22/2022   Lab Results  Component Value Date   HDL 45 05/22/2022   Lab Results  Component Value Date   LDLCALC 68 05/22/2022   Lab Results  Component Value Date   TRIG 89 05/22/2022   Lab Results  Component Value Date   CHOLHDL 2.9 05/22/2022   Lab Results  Component Value Date   HGBA1C 7.7 (H) 05/22/2022      Assessment & Plan:   Problem List Items Addressed This Visit       Endocrine   T2DM (type 2 diabetes mellitus) ([place])    Reports following up with her endocrinologist next week The highest BS is 380, and the lowest is 171 She reports adherence to the treatment regimen Encouraged to continue following up with her endocrinologist      Relevant Orders   HM Diabetes Foot Exam (Completed)  Hemoglobin A1C   Hypothyroidism    Encouraged to continue taking synthroid 112 mcg Pending labs      Relevant Orders   TSH + free T4     Musculoskeletal and Integument   Foot callus - Primary    She has a callus under both her feet, for which she sees a podiatrist, Dr.Cody Irving Shows, in [place] Encouraged to follow up with Dr. Steffanie Rainwater for callus removal        Other   Urge incontinence    reports following up with urology and takes gemtesa 75 mg with minimum relief of symptoms She has completed 11 of 12 sessions and reports minor relief in her symptoms        Anxiety    Encouraged to continue taking Prozac 20 mg daily      Elevated blood pressure reading in office without diagnosis of hypertension   Relevant Orders   CBC with Differential/Platelet    CMP14+EGFR   Other Visit Diagnoses     Immunization due       Relevant Orders   Pneumococcal polysaccharide vaccine 23-valent greater than or equal to 2yo subcutaneous/IM (Completed)   Vitamin D deficiency       Relevant Orders   Vitamin D (25 hydroxy)   Other hyperlipidemia       Relevant Orders   Lipid Profile       No orders of the defined types were placed in this encounter.   Follow-up: Return in about 4 months (around 12/22/2022) for CPE.    Alvira Monday, FNP

## 2022-08-22 NOTE — Assessment & Plan Note (Signed)
Reports following up with her endocrinologist next week The highest BS is 380, and the lowest is 171 She reports adherence to the treatment regimen Encouraged to continue following up with her endocrinologist

## 2022-08-22 NOTE — Telephone Encounter (Signed)
Patient called was seen in office this morning to let Peter Congo know the medicine that was given makes her dizzy, need to call in methocarbolibin ???  Call back # [phone]

## 2022-08-23 ENCOUNTER — Ambulatory Visit: Payer: 59 | Admitting: Physician Assistant

## 2022-08-23 LAB — LIPID PANEL
Chol/HDL Ratio: 2.7 ratio (ref 0.0–4.4)
Cholesterol, Total: 131 mg/dL (ref 100–199)
HDL: 48 mg/dL (ref >39)
LDL Chol Calc (NIH): 64 mg/dL (ref 0–99)
Triglycerides: 106 mg/dL (ref 0–149)
VLDL Cholesterol Cal: 19 mg/dL (ref 5–40)

## 2022-08-23 LAB — HEMOGLOBIN A1C
Est. average glucose Bld gHb Est-mCnc: 220 mg/dL
Hgb A1c MFr Bld: 9.3 % — ABNORMAL HIGH (ref 4.8–5.6)

## 2022-08-23 LAB — CBC WITH DIFFERENTIAL/PLATELET
Basophils Absolute: 0 10*3/uL (ref 0.0–0.2)
Basos: 1 %
EOS (ABSOLUTE): 0.1 10*3/uL (ref 0.0–0.4)
Eos: 1 %
Hematocrit: 39.8 % (ref 34.0–46.6)
Hemoglobin: 13.3 g/dL (ref 11.1–15.9)
Immature Grans (Abs): 0 10*3/uL (ref 0.0–0.1)
Immature Granulocytes: 0 %
Lymphocytes Absolute: 1.5 10*3/uL (ref 0.7–3.1)
Lymphs: 32 %
MCH: 28.9 pg (ref 26.6–33.0)
MCHC: 33.4 g/dL (ref 31.5–35.7)
MCV: 86 fL (ref 79–97)
Monocytes Absolute: 0.5 10*3/uL (ref 0.1–0.9)
Monocytes: 11 %
Neutrophils Absolute: 2.6 10*3/uL (ref 1.4–7.0)
Neutrophils: 55 %
Platelets: 202 10*3/uL (ref 150–450)
RBC: 4.61 x10E6/uL (ref 3.77–5.28)
RDW: 13.7 % (ref 11.7–15.4)
WBC: 4.6 10*3/uL (ref 3.4–10.8)

## 2022-08-23 LAB — TSH+FREE T4
Free T4: 1.46 ng/dL (ref 0.82–1.77)
TSH: 0.317 u[IU]/mL — ABNORMAL LOW (ref 0.450–4.500)

## 2022-08-23 LAB — CMP14+EGFR
ALT: 25 IU/L (ref 0–32)
AST: 28 IU/L (ref 0–40)
Albumin/Globulin Ratio: 1.6 (ref 1.2–2.2)
Albumin: 4.1 g/dL (ref 3.8–4.8)
Alkaline Phosphatase: 40 IU/L — ABNORMAL LOW (ref 44–121)
BUN/Creatinine Ratio: 16 (ref 12–28)
BUN: 11 mg/dL (ref 8–27)
Bilirubin Total: 0.4 mg/dL (ref 0.0–1.2)
CO2: 23 mmol/L (ref 20–29)
Calcium: 9.9 mg/dL (ref 8.7–10.3)
Chloride: 99 mmol/L (ref 96–106)
Creatinine, Ser: 0.67 mg/dL (ref 0.57–1.00)
Globulin, Total: 2.6 g/dL (ref 1.5–4.5)
Glucose: 211 mg/dL — ABNORMAL HIGH (ref 70–99)
Potassium: 4.4 mmol/L (ref 3.5–5.2)
Sodium: 136 mmol/L (ref 134–144)
Total Protein: 6.7 g/dL (ref 6.0–8.5)
eGFR: 92 mL/min/{1.73_m2} (ref >59)

## 2022-08-23 LAB — VITAMIN D 25 HYDROXY (VIT D DEFICIENCY, FRACTURES): Vit D, 25-Hydroxy: 36.7 ng/mL (ref 30.0–100.0)

## 2022-08-23 NOTE — Telephone Encounter (Signed)
Left vm informing pt

## 2022-08-24 ENCOUNTER — Other Ambulatory Visit: Payer: Self-pay | Admitting: Family Medicine

## 2022-08-24 DIAGNOSIS — E1100 Type 2 diabetes mellitus with hyperosmolarity without nonketotic hyperglycemic-hyperosmolar coma (NKHHC): Secondary | ICD-10-CM

## 2022-08-24 MED ORDER — RYBELSUS 3 MG PO TABS: ORAL_TABLET | ORAL | 0 refills | Status: DC

## 2022-08-28 NOTE — Progress Notes (Signed)
Please inform the patient to start taking Rybelsus to the treatment regimen for her T2DM. Her HgA1c has increased from 7.7 to 9.3.

## 2022-08-30 ENCOUNTER — Encounter: Payer: Self-pay | Admitting: Nurse Practitioner

## 2022-08-30 ENCOUNTER — Ambulatory Visit (INDEPENDENT_AMBULATORY_CARE_PROVIDER_SITE_OTHER): Payer: 59 | Admitting: Physician Assistant

## 2022-08-30 ENCOUNTER — Ambulatory Visit (INDEPENDENT_AMBULATORY_CARE_PROVIDER_SITE_OTHER): Payer: 59 | Admitting: Nurse Practitioner

## 2022-08-30 VITALS — BP 142/84 | HR 64 | Ht 66.0 in | Wt 206.6 lb

## 2022-08-30 DIAGNOSIS — Z794 Long term (current) use of insulin: Secondary | ICD-10-CM

## 2022-08-30 DIAGNOSIS — N3941 Urge incontinence: Secondary | ICD-10-CM | POA: Diagnosis not present

## 2022-08-30 DIAGNOSIS — E119 Type 2 diabetes mellitus without complications: Secondary | ICD-10-CM

## 2022-08-30 DIAGNOSIS — E039 Hypothyroidism, unspecified: Secondary | ICD-10-CM

## 2022-08-30 NOTE — Progress Notes (Signed)
Endocrinology Follow Up Note       08/30/2022, 1:32 PM   Subjective:    Patient ID: [name], female    DOB: 04-Mar-1951.  [name] is being seen in follow up after being seen in consultation for management of currently uncontrolled symptomatic diabetes requested by  Alvira Monday, [place].   Past Medical History:  Diagnosis Date   Anemia    as a teenager   Anxiety    Arthritis    knees, rt hand,hips   Cancer (HCC)    vaginal,uterine,ovariam   COPD (chronic obstructive pulmonary disease) (HCC)    Depression    Diabetes mellitus without complication (HCC)    Hypothyroidism    Neuromuscular disorder ([place])    hands and feet    Past Surgical History:  Procedure Laterality Date   ABDOMINAL HYSTERECTOMY     BREAST SURGERY Right 1988   lumpectomy   EYE SURGERY Bilateral    FRACTURE SURGERY     Rt ankle,lt knee cap,lt shoulder,rt wrist,rt shoulder,   TONSILLECTOMY     at age 89   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   Procedure: TOTAL KNEE ARTHROPLASTY;  Surgeon: Earlie Server, MD;  Location: [facility];  Service: Orthopedics;  Laterality: Right;    Social History   Socioeconomic History   Marital status: Married    Spouse name: Not on file   Number of children: Not on file   Years of education: Not on file   Highest education level: Not on file  Occupational History   Not on file  Tobacco Use   Smoking status: Former    Packs/day: 0.50    Years: 15.00    Total pack years: 7.50    Types: Cigarettes    Quit date: 2013    Years since quitting: 10.6   Smokeless tobacco: Never  Vaping Use   Vaping Use: Never used  Substance and Sexual Activity   Alcohol use: Never   Drug use: Never   Sexual activity: Not on file  Other Topics Concern   Not on file  Social History Narrative   Not on file   Social Determinants of Health   Financial Resource Strain: Not on file  Food  Insecurity: Not on file  Transportation Needs: Not on file  Physical Activity: Not on file  Stress: Not on file  Social Connections: Not on file    Family History  Problem Relation Age of Onset   Cancer Mother    Hypertension Mother    Thyroid disease Mother    Diabetes Mother    Stroke Mother    Heart failure Mother    Diabetes Father    Heart attack Father    Diabetes Sister    Diabetes Brother     Outpatient Encounter Medications as of 08/30/2022  Medication Sig   acyclovir (ZOVIRAX) 400 MG tablet TAKE 1 TABLET BY MOUTH EVERY TWELVE HOURS (AT BREAKFAST AND AT BEDTIME)   Apple Cider Vinegar 300 MG TABS Take by mouth.   aspirin 81 MG chewable tablet Chew 1 tablet (81 mg total) by mouth 2 (two) times daily.   atorvastatin (LIPITOR) 20 MG tablet Take 1 tablet (  20 mg total) by mouth at bedtime.   Blood Pressure Monitoring (BLOOD PRESSURE MONITOR/L CUFF) MISC Dx: R03.0, elevated bp in office   cholecalciferol (VITAMIN D3) 25 MCG (1000 UNIT) tablet Take 1,000 Units by mouth daily.   clonazePAM (KLONOPIN) 0.5 MG tablet Take 1 tablet (0.5 mg total) by mouth at bedtime.   donepezil (ARICEPT) 10 MG tablet Take 1 tablet (10 mg total) by mouth daily.   EPINEPHrine 0.3 mg/0.3 mL IJ SOAJ injection Inject 0.3 mg into the muscle as needed for anaphylaxis.   escitalopram (LEXAPRO) 20 MG tablet TAKE 1 TABLET BY MOUTH EVERY DAY   glucose blood (TRUE METRIX BLOOD GLUCOSE TEST) test strip USE AS DIRECTED   insulin glargine (LANTUS SOLOSTAR) 100 UNIT/ML Solostar Pen Inject 30 Units into the skin 2 (two) times daily. Inject 50 units subcutaneously with breakfast & 40 units subcutaneously after supper (Patient taking differently: Inject 30 Units into the skin 2 (two) times daily. Inject 50 units subcutaneously with breakfast & 40 units subcutaneously after supper  Patient reports that she takes 30 units at bedtime)   insulin lispro (HUMALOG) 100 UNIT/ML KwikPen Inject 5-8 Units into the skin 3 (three)  times daily.   Insulin Pen Needle (BD AUTOSHIELD DUO) 30G X 5 MM MISC Use 4 times daily as instructed   levothyroxine (SYNTHROID) 112 MCG tablet Take 1 tablet (112 mcg total) by mouth daily.   loperamide (IMODIUM) 2 MG capsule Take 2 mg by mouth See admin instructions. Take 2 mg PO after each loose stool as needed for diarrhea (max of 3 doses in 24 hours)   metoCLOPramide (REGLAN) 5 MG tablet TAKE 1 TABLET BY MOUTH FOUR TIMES DAILY   nystatin cream (MYCOSTATIN) Apply 1 Application topically See admin instructions. Apply to skin folds, under breasts, abdominal folds, and inguinal folds twice a day for candidasis   ondansetron (ZOFRAN) 4 MG tablet Take 1 tablet (4 mg total) by mouth every 6 (six) hours as needed.   prazosin (MINIPRESS) 2 MG capsule Take 1 capsule (2 mg total) by mouth at bedtime.   predniSONE (DELTASONE) 20 MG tablet Take 1 tablet (20 mg total) by mouth 2 (two) times daily with a meal.   Semaglutide (RYBELSUS) 3 MG TABS Take 3 mg by mouth daily for 28 days, THEN 7 mg daily for 28 days, THEN 7 mg daily for 28 days, THEN 14 mg daily for 28 days.   Specialty Vitamins Products (COLLAGEN ULTRA PO) Take by mouth.   Tetrahydrozoline-Zn Sulfate (EYE DROPS AR OP) Apply to eye.   traZODone (DESYREL) 50 MG tablet TAKE 2 TABLETS BY MOUTH AT BEDTIME   Vibegron (GEMTESA) 75 MG TABS Take 75 mg by mouth daily.   vitamin B-12 (CYANOCOBALAMIN) 1000 MCG tablet Take 1,000 mcg by mouth daily.   methocarbamol (ROBAXIN) 500 MG tablet Take 1 tablet (500 mg total) by mouth 2 (two) times daily. (Patient not taking: Reported on 08/22/2022)   No facility-administered encounter medications on file as of 08/30/2022.    ALLERGIES: Allergies  Allergen Reactions   Ambien [Zolpidem Tartrate] Other (See Comments)    Hallucinations/nightmares   Bee Venom Swelling   Coconut Flavor Swelling    Mouth swells   Codeine Nausea And Vomiting   Onion Nausea Only and Other (See Comments)    Raw onions   Other      Ground Beef (patient avoid due to side effect of diarrhea)   Sulfa Antibiotics Rash and Other (See Comments)    Including topical  sulfa  (mouth dries/peels/skin blisters)    VACCINATION STATUS: Immunization History  Administered Date(s) Administered   Pneumococcal Polysaccharide-23 08/22/2022   Tdap 05/29/2005, 05/22/2022   Zoster Recombinat (Shingrix) 05/22/2022    Diabetes She presents for her follow-up diabetic visit. She has type 2 diabetes mellitus. Onset time: Diagnosed at approx age of 31. Her disease course has been fluctuating. There are no hypoglycemic associated symptoms. Associated symptoms include foot paresthesias. There are no hypoglycemic complications. Symptoms are stable. There are no diabetic complications. Risk factors for coronary artery disease include diabetes mellitus, family history, obesity, sedentary lifestyle and post-menopausal. Current diabetic treatment includes intensive insulin program. She is compliant with treatment most of the time. Her weight is fluctuating minimally. She is following a generally healthy diet. Meal planning includes avoidance of concentrated sweets. She has not had a previous visit with a dietitian. She participates in exercise daily. Her home blood glucose trend is fluctuating minimally. Her overall blood glucose range is 180-200 mg/dl. (She presents today with her CGM and logs showing above target glycemic profile overall.  She was not due for another A1c today.  She brought her CGM sensors with her for help in applying a new one today.  ) An ACE inhibitor/angiotensin II receptor blocker is being taken. She sees a podiatrist.Eye exam is current.    Review of systems  Constitutional: + Minimally fluctuating body weight, current Body mass index is 33.35 kg/m., no fatigue, no subjective hyperthermia, no subjective hypothermia Eyes: no blurry vision, no xerophthalmia ENT: no sore throat, no nodules palpated in throat, no  dysphagia/odynophagia, no hoarseness Cardiovascular: no chest pain, no shortness of breath, no palpitations, no leg swelling Respiratory: no cough, no shortness of breath Gastrointestinal: no nausea/vomiting/diarrhea Musculoskeletal: no muscle/joint aches Skin: no rashes, no hyperemia Neurological: no tremors, + numbness/tingling to BLE and bilateral hands, no dizziness Psychiatric: no depression, no anxiety  Objective:     BP (!) 142/84 (BP Location: Left Arm, Patient Position: Sitting, Cuff Size: Large)   Pulse 64   Ht '5\' 6"'$  (1.676 m)   Wt 206 lb 9.6 oz (93.7 kg)   BMI 33.35 kg/m   Wt Readings from Last 3 Encounters:  08/30/22 206 lb 9.6 oz (93.7 kg)  08/22/22 206 lb 1.9 oz (93.5 kg)  07/31/22 209 lb (94.8 kg)     BP Readings from Last 3 Encounters:  08/30/22 (!) 142/84  08/22/22 124/74  07/31/22 120/70      Physical Exam- Limited  Constitutional:  Body mass index is 33.35 kg/m. , not in acute distress, normal state of mind Eyes:  EOMI, no exophthalmos Neck: Supple Cardiovascular: RRR, no murmurs, rubs, or gallops, no edema Respiratory: Adequate breathing efforts, no crackles, rales, rhonchi, or wheezing Musculoskeletal: no gross deformities, strength intact in all four extremities, no gross restriction of joint movements Skin:  no rashes, no hyperemia Neurological: no tremor with outstretched hands    CMP ( most recent) CMP     Component Value Date/Time   NA 136 08/22/2022 1023   K 4.4 08/22/2022 1023   CL 99 08/22/2022 1023   CO2 23 08/22/2022 1023   GLUCOSE 211 (H) 08/22/2022 1023   GLUCOSE 102 (H) 04/25/2021 1343   BUN 11 08/22/2022 1023   CREATININE 0.67 08/22/2022 1023   CALCIUM 9.9 08/22/2022 1023   PROT 6.7 08/22/2022 1023   ALBUMIN 4.1 08/22/2022 1023   AST 28 08/22/2022 1023   ALT 25 08/22/2022 1023   ALKPHOS 40 (L) 08/22/2022 1023  BILITOT 0.4 08/22/2022 1023   GFRNONAA >60 04/25/2021 1343   GFRAA >60 09/12/2019 0239     Diabetic Labs  (most recent): Lab Results  Component Value Date   HGBA1C 9.3 (H) 08/22/2022   HGBA1C 7.7 (H) 05/22/2022   HGBA1C 7.4 (H) 09/03/2019     Lipid Panel ( most recent) Lipid Panel     Component Value Date/Time   CHOL 131 08/22/2022 1023   TRIG 106 08/22/2022 1023   HDL 48 08/22/2022 1023   CHOLHDL 2.7 08/22/2022 1023   LDLCALC 64 08/22/2022 1023   LABVLDL 19 08/22/2022 1023      Lab Results  Component Value Date   TSH 0.317 (L) 08/22/2022   TSH 0.403 (L) 05/22/2022   FREET4 1.46 08/22/2022   FREET4 1.52 05/22/2022           Assessment & Plan:   1) Type 2 diabetes mellitus without complication, with long-term current use of insulin ([place])    She presents today with her CGM and logs showing above target glycemic profile overall.  She was not due for another A1c today.  She brought her CGM sensors with her for help in applying a new one today.    - [name] has currently uncontrolled symptomatic type 2 DM since 71 years of age.   -Recent labs reviewed.  - I had a long discussion with her about the progressive nature of diabetes and the pathology behind its complications. -her diabetes is not currently complicated but she remains at a high risk for more acute and chronic complications which include CAD, CVA, CKD, retinopathy, and neuropathy. These are all discussed in detail with her.  The following Lifestyle Medicine recommendations according to [facility]) were discussed and offered to patient and she agrees to start the journey:  A. Whole Foods, Plant-based plate comprising of fruits and vegetables, plant-based proteins, whole-grain carbohydrates was discussed in detail with the patient.   A list for source of those nutrients were also provided to the patient.  Patient will use only water or unsweetened tea for hydration. B.  The need to stay away from risky substances including alcohol, smoking; obtaining 7 to 9 hours of  restorative sleep, at least 150 minutes of moderate intensity exercise weekly, the importance of healthy social connections,  and stress reduction techniques were discussed. C.  A full color page of  Calorie density of various food groups per pound showing examples of each food groups was provided to the patient.  - Nutritional counseling repeated at each appointment due to patients tendency to fall back in to old habits.  - The patient admits there is a room for improvement in their diet and drink choices. -  Suggestion is made for the patient to avoid simple carbohydrates from their diet including Cakes, Sweet Desserts / Pastries, Ice Cream, Soda (diet and regular), Sweet Tea, Candies, Chips, Cookies, Sweet Pastries, Store Bought Juices, Alcohol in Excess of 1-2 drinks a day, Artificial Sweeteners, Coffee Creamer, and "Sugar-free" Products. This will help patient to have stable blood glucose profile and potentially avoid unintended weight gain.   - I encouraged the patient to switch to unprocessed or minimally processed complex starch and increased protein intake (animal or plant source), fruits, and vegetables.   - Patient is advised to stick to a routine mealtimes to eat 3 meals a day and avoid unnecessary snacks (to snack only to correct hypoglycemia).  - I have approached her with the  following individualized plan to manage her diabetes and patient agrees:   -Based on her hyperglycemia overall, she is advised to increase her Lantus to 36 units SQ nightly and adjust her Humalog 5-11 units TID with meals if glucose is above 90 and she is eating (Specific instructions on how to titrate insulin dosage based on glucose readings given to patient in writing).   -she is encouraged to continue monitoring glucose 4 times daily (using her CGM), before meals and before bed, and to call the clinic if she has readings less than 70 or above 300 for 3 tests in a row.    - she is warned not to take insulin  without proper monitoring per orders. - Adjustment parameters are given to her for hypo and hyperglycemia in writing.  - she is not a candidate for sulfa meds due to allergy.  - she will be considered for incretin therapy as appropriate next visit.  - Specific targets for  A1c; LDL, HDL, and Triglycerides were discussed with the patient.  2) Blood Pressure /Hypertension:  her blood pressure is controlled to target without the use of antihypertensive medications.  3) Lipids/Hyperlipidemia:    Review of her recent lipid panel from 05/22/22 showed controlled LDL at 68 .  she is advised to continue Lipitor 20 mg daily at bedtime.  Side effects and precautions discussed with her.  4)  Weight/Diet:  her Body mass index is 33.35 kg/m.  -  clearly complicating her diabetes care.   she is a candidate for weight loss. I discussed with her the fact that loss of 5 - 10% of her  current body weight will have the most impact on her diabetes management.  Exercise, and detailed carbohydrates information provided  -  detailed on discharge instructions.  5) Hypothyroidism-unspecified Her most recent TFTs are consistent with appropriate hormone replacement.  She is advised to continue Levothyroxine 112 mcg po daily before breakfast.   - The correct intake of thyroid hormone (Levothyroxine, Synthroid), is on empty stomach first thing in the morning, with water, separated by at least 30 minutes from breakfast and other medications,  and separated by more than 4 hours from calcium, iron, multivitamins, acid reflux medications (PPIs).  - This medication is a life-long medication and will be needed to correct thyroid hormone imbalances for the rest of your life.  The dose may change from time to time, based on thyroid blood work.  - It is extremely important to be consistent taking this medication, near the same time each morning.  -AVOID TAKING PRODUCTS CONTAINING BIOTIN (commonly found in Hair, Skin, Nails  vitamins) AS IT INTERFERES WITH THE VALIDITY OF THYROID FUNCTION BLOOD TESTS.  6) Chronic Care/Health Maintenance: -she is not on ACEI/ARB and is on Statin medications and is encouraged to initiate and continue to follow up with Ophthalmology, Dentist, Podiatrist at least yearly or according to recommendations, and advised to stay away from smoking. I have recommended yearly flu vaccine and pneumonia vaccine at least every 5 years; moderate intensity exercise for up to 150 minutes weekly; and sleep for at least 7 hours a day.  - she is advised to maintain close follow up with Alvira Monday, FNP for primary care needs, as well as her other providers for optimal and coordinated care.     I spent 38 minutes in the care of the patient today including review of labs from [place], Lipids, Thyroid Function, Hematology (current and previous including abstractions from other facilities); face-to-face time  discussing  her blood glucose readings/logs, discussing hypoglycemia and hyperglycemia episodes and symptoms, medications doses, her options of short and long term treatment based on the latest standards of care / guidelines;  discussion about incorporating lifestyle medicine;  and documenting the encounter. Risk reduction counseling performed per USPSTF guidelines to reduce obesity and cardiovascular risk factors.     Please refer to Patient Instructions for Blood Glucose Monitoring and Insulin/Medications Dosing Guide"  in media tab for additional information. Please  also refer to " Patient Self Inventory" in the Media  tab for reviewed elements of pertinent patient history.  Teresa Pelton participated in the discussions, expressed understanding, and voiced agreement with the above plans.  All questions were answered to her satisfaction. she is encouraged to contact clinic should she have any questions or concerns prior to her return visit.     Follow up plan: - Return in about 3 months (around  11/29/2022) for Diabetes F/U with A1c in office, No previsit labs, Bring meter and logs.   Rayetta Pigg, [facility] [address] [place], [place] [address] Phone: [phone] Fax: [phone]  08/30/2022, 1:32 PM

## 2022-08-30 NOTE — Progress Notes (Signed)
PTNS  Session # 12  Health & Social Factors: Yes Caffeine: 0 Alcohol: 0 Daytime voids #per day: all day no notice Night-time voids #per night: all night no notice Urgency: yes Incontinence Episodes #per day: alaways Ankle used: left Treatment Setting: 4 Feeling/ Response: up the leg Comments: none  Performed By: Marisue Brooklyn, CMA  Follow Up: Follow up as scheduled

## 2022-09-03 ENCOUNTER — Ambulatory Visit (HOSPITAL_COMMUNITY): Payer: 59

## 2022-09-03 ENCOUNTER — Other Ambulatory Visit (HOSPITAL_COMMUNITY): Payer: 59

## 2022-09-07 ENCOUNTER — Telehealth: Payer: Self-pay | Admitting: Family Medicine

## 2022-09-07 ENCOUNTER — Other Ambulatory Visit: Payer: Self-pay

## 2022-09-07 MED ORDER — INSULIN LISPRO (1 UNIT DIAL) 100 UNIT/ML (KWIKPEN): 5.0000 [IU] | PEN_INJECTOR | Freq: Three times a day (TID) | SUBCUTANEOUS | 2 refills | Status: DC

## 2022-09-07 NOTE — Telephone Encounter (Signed)
Patient needs refill on insulin lispro (HUMALOG) 100 UNIT/ML KwikPen   Patient is completley out of insulin   [facility]

## 2022-09-10 ENCOUNTER — Other Ambulatory Visit: Payer: Self-pay | Admitting: Family Medicine

## 2022-09-10 DIAGNOSIS — F419 Anxiety disorder, unspecified: Secondary | ICD-10-CM

## 2022-09-13 ENCOUNTER — Ambulatory Visit (INDEPENDENT_AMBULATORY_CARE_PROVIDER_SITE_OTHER): Payer: 59 | Admitting: Urology

## 2022-09-13 ENCOUNTER — Encounter: Payer: Self-pay | Admitting: Urology

## 2022-09-13 VITALS — BP 136/60 | HR 73

## 2022-09-13 DIAGNOSIS — R339 Retention of urine, unspecified: Secondary | ICD-10-CM | POA: Diagnosis not present

## 2022-09-13 DIAGNOSIS — R350 Frequency of micturition: Secondary | ICD-10-CM

## 2022-09-13 DIAGNOSIS — R351 Nocturia: Secondary | ICD-10-CM

## 2022-09-13 DIAGNOSIS — N3941 Urge incontinence: Secondary | ICD-10-CM | POA: Diagnosis not present

## 2022-09-13 DIAGNOSIS — N952 Postmenopausal atrophic vaginitis: Secondary | ICD-10-CM

## 2022-09-13 NOTE — Progress Notes (Signed)
Pt is prepped for and in and out catherization. Patient was cleaned and prepped in a sterle fashion with betadine. A 14 fr catheter foley was inserted. Urine return was note 170 ml.  Performed by [facility] LPN

## 2022-09-13 NOTE — Progress Notes (Signed)
Subjective:  1. Urge incontinence   2. Urine frequency   3. Nocturia   4. Incomplete bladder emptying   5. Vaginal atrophy     [name] returns today in f/u for her history of incontinence.  She has completed PTNS without benefit.  She is not sure she is responding to [place]. She uses about 30 ppd.  She leaks at night as well.  She feels she empties but will leak soon after voiding.  She never had urodynamics.  She has no dysuria or hematuria.  She has had a a hysterectomy and both overies and tubes removed.  She is a diabetic with some neuropathy.   Her PVR today is 193m.  UA is unremarkable.      ROS:  ROS:  A complete review of systems was performed.  All systems are negative except for pertinent findings as noted.   Review of Systems  Constitutional:  Positive for malaise/fatigue.  Eyes:  Positive for blurred vision.  Cardiovascular:  Positive for leg swelling.  Musculoskeletal:  Positive for back pain and joint pain.  Neurological:  Positive for dizziness.  Endo/Heme/Allergies:  Positive for polydipsia. Bruises/bleeds easily.  Psychiatric/Behavioral:  Positive for memory loss.     Allergies  Allergen Reactions   Ambien [Zolpidem Tartrate] Other (See Comments)    Hallucinations/nightmares   Bee Venom Swelling   Coconut Flavor Swelling    Mouth swells   Codeine Nausea And Vomiting   Onion Nausea Only and Other (See Comments)    Raw onions   Other     Ground Beef (patient avoid due to side effect of diarrhea)   Sulfa Antibiotics Rash and Other (See Comments)    Including topical sulfa  (mouth dries/peels/skin blisters)    Outpatient Encounter Medications as of 09/13/2022  Medication Sig   acyclovir (ZOVIRAX) 400 MG tablet TAKE 1 TABLET BY MOUTH EVERY TWELVE HOURS (AT BREAKFAST AND AT BEDTIME)   Apple Cider Vinegar 300 MG TABS Take by mouth.   aspirin 81 MG chewable tablet Chew 1 tablet (81 mg total) by mouth 2 (two) times daily.   atorvastatin (LIPITOR) 20 MG  tablet Take 1 tablet (20 mg total) by mouth at bedtime.   Blood Pressure Monitoring (BLOOD PRESSURE MONITOR/L CUFF) MISC Dx: R03.0, elevated bp in office   cholecalciferol (VITAMIN D3) 25 MCG (1000 UNIT) tablet Take 1,000 Units by mouth daily.   clonazePAM (KLONOPIN) 0.5 MG tablet TAKE 1 TABLET BY MOUTH AT BEDTIME   donepezil (ARICEPT) 10 MG tablet Take 1 tablet (10 mg total) by mouth daily.   EPINEPHrine 0.3 mg/0.3 mL IJ SOAJ injection Inject 0.3 mg into the muscle as needed for anaphylaxis.   escitalopram (LEXAPRO) 20 MG tablet TAKE 1 TABLET BY MOUTH EVERY DAY   glucose blood (TRUE METRIX BLOOD GLUCOSE TEST) test strip USE AS DIRECTED   insulin glargine (LANTUS SOLOSTAR) 100 UNIT/ML Solostar Pen Inject 30 Units into the skin 2 (two) times daily. Inject 50 units subcutaneously with breakfast & 40 units subcutaneously after supper (Patient taking differently: Inject 30 Units into the skin 2 (two) times daily. Inject 50 units subcutaneously with breakfast & 40 units subcutaneously after supper  Patient reports that she takes 30 units at bedtime)   insulin lispro (HUMALOG) 100 UNIT/ML KwikPen Inject 5-8 Units into the skin 3 (three) times daily.   Insulin Pen Needle (BD AUTOSHIELD DUO) 30G X 5 MM MISC Use 4 times daily as instructed   levothyroxine (SYNTHROID) 112 MCG tablet Take 1 tablet (  112 mcg total) by mouth daily.   loperamide (IMODIUM) 2 MG capsule Take 2 mg by mouth See admin instructions. Take 2 mg PO after each loose stool as needed for diarrhea (max of 3 doses in 24 hours)   methocarbamol (ROBAXIN) 500 MG tablet Take 1 tablet (500 mg total) by mouth 2 (two) times daily.   metoCLOPramide (REGLAN) 5 MG tablet TAKE 1 TABLET BY MOUTH FOUR TIMES DAILY   nystatin cream (MYCOSTATIN) Apply 1 Application topically See admin instructions. Apply to skin folds, under breasts, abdominal folds, and inguinal folds twice a day for candidasis   ondansetron (ZOFRAN) 4 MG tablet Take 1 tablet (4 mg total) by  mouth every 6 (six) hours as needed.   prazosin (MINIPRESS) 2 MG capsule Take 1 capsule (2 mg total) by mouth at bedtime.   predniSONE (DELTASONE) 20 MG tablet Take 1 tablet (20 mg total) by mouth 2 (two) times daily with a meal.   Semaglutide (RYBELSUS) 3 MG TABS Take 3 mg by mouth daily for 28 days, THEN 7 mg daily for 28 days, THEN 7 mg daily for 28 days, THEN 14 mg daily for 28 days.   Specialty Vitamins Products (COLLAGEN ULTRA PO) Take by mouth.   Tetrahydrozoline-Zn Sulfate (EYE DROPS AR OP) Apply to eye.   traZODone (DESYREL) 50 MG tablet TAKE 2 TABLETS BY MOUTH AT BEDTIME   Vibegron (GEMTESA) 75 MG TABS Take 75 mg by mouth daily.   vitamin B-12 (CYANOCOBALAMIN) 1000 MCG tablet Take 1,000 mcg by mouth daily.   No facility-administered encounter medications on file as of 09/13/2022.    Past Medical History:  Diagnosis Date   Anemia    as a teenager   Anxiety    Arthritis    knees, rt hand,hips   Cancer (HCC)    vaginal,uterine,ovariam   COPD (chronic obstructive pulmonary disease) (HCC)    Depression    Diabetes mellitus without complication (HCC)    Hypothyroidism    Neuromuscular disorder ([place])    hands and feet    Past Surgical History:  Procedure Laterality Date   ABDOMINAL HYSTERECTOMY     BREAST SURGERY Right 1988   lumpectomy   EYE SURGERY Bilateral    FRACTURE SURGERY     Rt ankle,lt knee cap,lt shoulder,rt wrist,rt shoulder,   TONSILLECTOMY     at age 62   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   Procedure: TOTAL KNEE ARTHROPLASTY;  Surgeon: Earlie Server, MD;  Location: [facility];  Service: Orthopedics;  Laterality: Right;    Social History   Socioeconomic History   Marital status: Married    Spouse name: Not on file   Number of children: Not on file   Years of education: Not on file   Highest education level: Not on file  Occupational History   Not on file  Tobacco Use   Smoking status: Former    Packs/day: 0.50    Years: 15.00    Total pack  years: 7.50    Types: Cigarettes    Quit date: 2013    Years since quitting: 10.7   Smokeless tobacco: Never  Vaping Use   Vaping Use: Never used  Substance and Sexual Activity   Alcohol use: Never   Drug use: Never   Sexual activity: Not on file  Other Topics Concern   Not on file  Social History Narrative   Not on file   Social Determinants of Health   Financial Resource Strain: Not on file  Food Insecurity: Not on file  Transportation Needs: Not on file  Physical Activity: Not on file  Stress: Not on file  Social Connections: Not on file  Intimate Partner Violence: Not on file    Family History  Problem Relation Age of Onset   Cancer Mother    Hypertension Mother    Thyroid disease Mother    Diabetes Mother    Stroke Mother    Heart failure Mother    Diabetes Father    Heart attack Father    Diabetes Sister    Diabetes Brother        Objective: Vitals:   09/13/22 1103  BP: 136/60  Pulse: 73     Physical Exam Genitourinary:    Comments: Nl ext genitalia. Mild introital stenosis with moderate atrophy. Normal meatus. No urethral hypermobility or leakage. No cystocele or rectocele.      Lab Results:  PSA No results found for: "PSA" No results found for: "TESTOSTERONE"    Studies/Results: No results found.     Assessment & Plan: Severe urge and insensible incontinence with no response to PTNS or Gemtesa.  I will get her set up for urodynamics.  She will hold the Gemtesa in the meantime to see if is doing any good.   Incomplete bladder emptying.  Her PVR is 116m and she could have a component of diabetic cystopathy.  Vaginal atrophy.    She has moderate atrophy.      No orders of the defined types were placed in this encounter.    Orders Placed This Encounter  Procedures   Urinalysis, Routine w reflex microscopic   Ambulatory referral to Urology    Referral Priority:   Routine    Referral Type:   Consultation    Referral  Reason:   Specialty Services Required    Referred to Provider:   WIrine Seal MD    Requested Specialty:   Urology    Number of Visits Requested:   1   In and Out Cath      Return for Next available with me or Dr. SFelipa Ethwith the urodynamics results. .   CC:Alvira Monday FNP      JIrine Seal9/22/2023

## 2022-09-13 NOTE — Progress Notes (Signed)
Patient ID: [name], female   DOB: [birth year], 71 y.o.   MRN: [id number]

## 2022-09-14 ENCOUNTER — Ambulatory Visit (HOSPITAL_COMMUNITY): Payer: 59

## 2022-09-14 ENCOUNTER — Inpatient Hospital Stay (HOSPITAL_COMMUNITY): Admission: RE | Admit: 2022-09-14 | Payer: 59 | Source: Ambulatory Visit

## 2022-09-14 LAB — URINALYSIS, ROUTINE W REFLEX MICROSCOPIC
Bilirubin, UA: NEGATIVE
Leukocytes,UA: NEGATIVE
Nitrite, UA: NEGATIVE
Protein,UA: NEGATIVE
RBC, UA: NEGATIVE
Specific Gravity, UA: 1.02 (ref 1.005–1.030)
Urobilinogen, Ur: 0.2 mg/dL (ref 0.2–1.0)
pH, UA: 5 (ref 5.0–7.5)

## 2022-09-18 ENCOUNTER — Other Ambulatory Visit: Payer: Self-pay

## 2022-09-18 NOTE — Telephone Encounter (Signed)
Patient called to let you know that she went to make apt for urodynamics with [facility], they told her she had already had that test done.  I found results from the testing under chart review- media tab- date 03/14/2022.  Patient is asking what her next steps are now.  Please advise.

## 2022-09-19 MED ORDER — TROSPIUM CHLORIDE 20 MG PO TABS: 20.0000 mg | ORAL_TABLET | Freq: Two times a day (BID) | ORAL | 11 refills | Status: DC

## 2022-09-19 NOTE — Telephone Encounter (Signed)
FYI Patient would like to try the rx before considering the botox injections.  Rx sent to pharmacy and she will call if there is no improvement.  Patient also made aware of possible s/e.

## 2022-09-19 NOTE — Telephone Encounter (Signed)
   Irine Seal, MD  Audie Box, CMA Caller: Unspecified (Yesterday,  1:21 PM) We could try her on trospium '20mg'$  po bid #60 with refills.  She would need to be made aware of the possible side effects of dry mouth and constipation.   She has an unstable bladder and about the only other option for her would be botox injection into the bladder which I don't do.   If Dr. Alyson Ingles or [place] do that, we could have her see one of them.

## 2022-09-25 ENCOUNTER — Other Ambulatory Visit: Payer: Self-pay | Admitting: Family Medicine

## 2022-09-25 DIAGNOSIS — E1100 Type 2 diabetes mellitus with hyperosmolarity without nonketotic hyperglycemic-hyperosmolar coma (NKHHC): Secondary | ICD-10-CM

## 2022-09-28 ENCOUNTER — Ambulatory Visit (HOSPITAL_COMMUNITY)
Admission: RE | Admit: 2022-09-28 | Discharge: 2022-09-28 | Disposition: A | Discharge status: Home / Self Care | Payer: 59 | Source: Ambulatory Visit | Attending: Family Medicine | Admitting: Family Medicine

## 2022-09-28 DIAGNOSIS — Z1231 Encounter for screening mammogram for malignant neoplasm of breast: Secondary | ICD-10-CM | POA: Diagnosis present

## 2022-09-28 DIAGNOSIS — M810 Age-related osteoporosis without current pathological fracture: Secondary | ICD-10-CM | POA: Diagnosis not present

## 2022-09-28 DIAGNOSIS — Z780 Asymptomatic menopausal state: Secondary | ICD-10-CM | POA: Diagnosis not present

## 2022-09-28 DIAGNOSIS — E119 Type 2 diabetes mellitus without complications: Secondary | ICD-10-CM | POA: Diagnosis not present

## 2022-09-28 DIAGNOSIS — Z13820 Encounter for screening for osteoporosis: Secondary | ICD-10-CM | POA: Diagnosis not present

## 2022-09-28 DIAGNOSIS — Z9189 Other specified personal risk factors, not elsewhere classified: Secondary | ICD-10-CM | POA: Insufficient documentation

## 2022-09-28 DIAGNOSIS — Z794 Long term (current) use of insulin: Secondary | ICD-10-CM | POA: Insufficient documentation

## 2022-09-30 ENCOUNTER — Other Ambulatory Visit: Payer: Self-pay | Admitting: Family Medicine

## 2022-09-30 DIAGNOSIS — G4709 Other insomnia: Secondary | ICD-10-CM

## 2022-10-01 ENCOUNTER — Other Ambulatory Visit: Payer: Self-pay | Admitting: Family Medicine

## 2022-10-01 DIAGNOSIS — M810 Age-related osteoporosis without current pathological fracture: Secondary | ICD-10-CM

## 2022-10-01 MED ORDER — ALENDRONATE SODIUM 70 MG PO TABS: 70.0000 mg | ORAL_TABLET | ORAL | 11 refills | Status: DC

## 2022-10-01 NOTE — Progress Notes (Signed)
Please inform the patient that she has osteoporosis. Screening is recommended every 2 years. I have started treatment for osteoporosis to decrease the risk of fractures and increase bone mass. The prescription is sent to her pharmacy. Please advise the patient to take the medication first thing in the morning and 30 minutes before the first food, beverage (except plain water), or other medication(s) of the day. Do not take with mineral water or with other beverages. Patients should be instructed to stay upright (not to lie down) for 30 minutes and until after first food of the day (to reduce esophageal irritation).  I recommend increasing her physical activities and taking OTC calcium supplements of 100 mg daily and vitamin D 800iu.

## 2022-10-05 ENCOUNTER — Telehealth: Payer: Self-pay | Admitting: Family Medicine

## 2022-10-05 NOTE — Telephone Encounter (Signed)
No evidence of malignancy in either breast

## 2022-10-05 NOTE — Telephone Encounter (Signed)
Pt informed

## 2022-10-05 NOTE — Telephone Encounter (Signed)
Pt called wanting results of mammogram??

## 2022-10-05 NOTE — Telephone Encounter (Signed)
Pt would like mammogram reviewed

## 2022-10-22 ENCOUNTER — Other Ambulatory Visit (HOSPITAL_COMMUNITY): Payer: Self-pay

## 2022-10-24 ENCOUNTER — Telehealth: Payer: Self-pay | Admitting: Nurse Practitioner

## 2022-10-24 DIAGNOSIS — Z794 Long term (current) use of insulin: Secondary | ICD-10-CM

## 2022-10-24 MED ORDER — LANTUS SOLOSTAR 100 UNIT/ML ~~LOC~~ SOPN: 36.0000 [IU] | PEN_INJECTOR | Freq: Every day | SUBCUTANEOUS | 3 refills | Status: DC

## 2022-10-24 NOTE — Telephone Encounter (Signed)
I sent it to [facility]

## 2022-10-24 NOTE — Telephone Encounter (Signed)
Pt is asking for a refill on her Lantus. She said she takes 36 units at night according to her sliding scale. She did not leave the pharmacy so I guess send to the last place you sent it. Thank you

## 2022-10-25 ENCOUNTER — Other Ambulatory Visit (HOSPITAL_COMMUNITY): Payer: Self-pay

## 2022-10-29 MED ORDER — BASAGLAR KWIKPEN 100 UNIT/ML ~~LOC~~ SOPN: 36.0000 [IU] | PEN_INJECTOR | Freq: Every day | SUBCUTANEOUS | 3 refills | Status: DC

## 2022-10-29 NOTE — Telephone Encounter (Signed)
Insurance prefers Administrator, sports. Please send whichever to [facility]. Thank you

## 2022-10-29 NOTE — Addendum Note (Signed)
Addended by: Brita Romp on: 10/29/2022 03:52 PM   Modules accepted: Orders

## 2022-10-29 NOTE — Telephone Encounter (Signed)
done

## 2022-10-30 ENCOUNTER — Other Ambulatory Visit: Payer: Self-pay

## 2022-10-30 MED ORDER — BASAGLAR KWIKPEN 100 UNIT/ML ~~LOC~~ SOPN: 36.0000 [IU] | PEN_INJECTOR | Freq: Every day | SUBCUTANEOUS | 3 refills | Status: DC

## 2022-11-04 ENCOUNTER — Other Ambulatory Visit: Payer: Self-pay | Admitting: Family Medicine

## 2022-11-04 DIAGNOSIS — F419 Anxiety disorder, unspecified: Secondary | ICD-10-CM

## 2022-11-08 ENCOUNTER — Other Ambulatory Visit: Payer: Self-pay | Admitting: Family Medicine

## 2022-11-08 DIAGNOSIS — E1100 Type 2 diabetes mellitus with hyperosmolarity without nonketotic hyperglycemic-hyperosmolar coma (NKHHC): Secondary | ICD-10-CM

## 2022-11-11 ENCOUNTER — Other Ambulatory Visit: Payer: Self-pay | Admitting: Family Medicine

## 2022-11-11 DIAGNOSIS — B009 Herpesviral infection, unspecified: Secondary | ICD-10-CM

## 2022-11-11 DIAGNOSIS — F515 Nightmare disorder: Secondary | ICD-10-CM

## 2022-11-11 DIAGNOSIS — F03A4 Unspecified dementia, mild, with anxiety: Secondary | ICD-10-CM

## 2022-11-11 DIAGNOSIS — E7849 Other hyperlipidemia: Secondary | ICD-10-CM

## 2022-11-11 DIAGNOSIS — F419 Anxiety disorder, unspecified: Secondary | ICD-10-CM

## 2022-11-11 DIAGNOSIS — E039 Hypothyroidism, unspecified: Secondary | ICD-10-CM

## 2022-11-29 ENCOUNTER — Encounter: Payer: Self-pay | Admitting: Nurse Practitioner

## 2022-11-29 ENCOUNTER — Ambulatory Visit (INDEPENDENT_AMBULATORY_CARE_PROVIDER_SITE_OTHER): Payer: 59 | Admitting: Nurse Practitioner

## 2022-11-29 VITALS — BP 117/68 | HR 66 | Ht 66.0 in | Wt 201.4 lb

## 2022-11-29 DIAGNOSIS — E039 Hypothyroidism, unspecified: Secondary | ICD-10-CM | POA: Diagnosis not present

## 2022-11-29 DIAGNOSIS — Z794 Long term (current) use of insulin: Secondary | ICD-10-CM | POA: Diagnosis not present

## 2022-11-29 DIAGNOSIS — E119 Type 2 diabetes mellitus without complications: Secondary | ICD-10-CM

## 2022-11-29 LAB — POCT GLYCOSYLATED HEMOGLOBIN (HGB A1C): Hemoglobin A1C: 8.8 % — AB (ref 4.0–5.6)

## 2022-11-29 MED ORDER — RYBELSUS 7 MG PO TABS: 7.0000 mg | ORAL_TABLET | Freq: Every day | ORAL | 3 refills | Status: DC

## 2022-11-29 MED ORDER — BASAGLAR KWIKPEN 100 UNIT/ML ~~LOC~~ SOPN: 40.0000 [IU] | PEN_INJECTOR | Freq: Every day | SUBCUTANEOUS | 3 refills | Status: DC

## 2022-11-29 MED ORDER — INSULIN LISPRO (1 UNIT DIAL) 100 UNIT/ML (KWIKPEN): 8.0000 [IU] | PEN_INJECTOR | Freq: Three times a day (TID) | SUBCUTANEOUS | 3 refills | Status: DC

## 2022-11-29 NOTE — Progress Notes (Signed)
Endocrinology Follow Up Note       11/29/2022, 4:16 PM   Subjective:    Patient ID: [name], female    DOB: 12/11/1951.  [name] is being seen in follow up after being seen in consultation for management of currently uncontrolled symptomatic diabetes requested by  [name], [name].   Past Medical History:  Diagnosis Date   Anemia    as a teenager   Anxiety    Arthritis    knees, rt hand,hips   Cancer (HCC)    vaginal,uterine,ovariam   COPD (chronic obstructive pulmonary disease) (HCC)    Depression    Diabetes mellitus without complication (HCC)    Hypothyroidism    Neuromuscular disorder ([place])    hands and feet    Past Surgical History:  Procedure Laterality Date   ABDOMINAL HYSTERECTOMY     BREAST SURGERY Right 1988   lumpectomy   EYE SURGERY Bilateral    FRACTURE SURGERY     Rt ankle,lt knee cap,lt shoulder,rt wrist,rt shoulder,   TONSILLECTOMY     at age 84   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   Procedure: TOTAL KNEE ARTHROPLASTY;  Surgeon: Earlie Server, MD;  Location: [facility];  Service: Orthopedics;  Laterality: Right;    Social History   Socioeconomic History   Marital status: Married    Spouse name: Not on file   Number of children: Not on file   Years of education: Not on file   Highest education level: Not on file  Occupational History   Not on file  Tobacco Use   Smoking status: Former    Packs/day: 0.50    Years: 15.00    Total pack years: 7.50    Types: Cigarettes    Quit date: 2013    Years since quitting: 10.9   Smokeless tobacco: Never  Vaping Use   Vaping Use: Never used  Substance and Sexual Activity   Alcohol use: Never   Drug use: Never   Sexual activity: Not on file  Other Topics Concern   Not on file  Social History Narrative   Not on file   Social Determinants of Health   Financial Resource Strain: Not on file  Food  Insecurity: Not on file  Transportation Needs: Not on file  Physical Activity: Not on file  Stress: Not on file  Social Connections: Not on file    Family History  Problem Relation Age of Onset   Cancer Mother    Hypertension Mother    Thyroid disease Mother    Diabetes Mother    Stroke Mother    Heart failure Mother    Diabetes Father    Heart attack Father    Diabetes Sister    Diabetes Brother     Outpatient Encounter Medications as of 11/29/2022  Medication Sig   acyclovir (ZOVIRAX) 400 MG tablet TAKE 1 TABLET BY MOUTH EVERY TWELVE HOURS (AT BREAKFAST AND AT BEDTIME)   alendronate (FOSAMAX) 70 MG tablet Take 1 tablet (70 mg total) by mouth every 7 (seven) days. Take with a full glass of water on an empty stomach.   Apple Cider Vinegar 300 MG TABS Take by  mouth.   aspirin 81 MG chewable tablet Chew 1 tablet (81 mg total) by mouth 2 (two) times daily.   atorvastatin (LIPITOR) 20 MG tablet TAKE 1 TABLET BY MOUTH AT BEDTIME   Blood Pressure Monitoring (BLOOD PRESSURE MONITOR/L CUFF) MISC Dx: R03.0, elevated bp in office   cholecalciferol (VITAMIN D3) 25 MCG (1000 UNIT) tablet Take 1,000 Units by mouth daily.   clonazePAM (KLONOPIN) 0.5 MG tablet TAKE 1 TABLET BY MOUTH AT BEDTIME   donepezil (ARICEPT) 10 MG tablet TAKE 1 TABLET BY MOUTH EVERY DAY   EPINEPHrine 0.3 mg/0.3 mL IJ SOAJ injection Inject 0.3 mg into the muscle as needed for anaphylaxis.   escitalopram (LEXAPRO) 20 MG tablet TAKE 1 TABLET BY MOUTH EVERY DAY   glucose blood (TRUE METRIX BLOOD GLUCOSE TEST) test strip USE AS DIRECTED   Insulin Pen Needle (BD AUTOSHIELD DUO) 30G X 5 MM MISC Use 4 times daily as instructed   levothyroxine (SYNTHROID) 112 MCG tablet TAKE 1 TABLET BY MOUTH DAILY   loperamide (IMODIUM) 2 MG capsule Take 2 mg by mouth See admin instructions. Take 2 mg PO after each loose stool as needed for diarrhea (max of 3 doses in 24 hours)   methocarbamol (ROBAXIN) 500 MG tablet Take 1 tablet (500 mg total)  by mouth 2 (two) times daily.   metoCLOPramide (REGLAN) 5 MG tablet TAKE 1 TABLET BY MOUTH FOUR TIMES DAILY   nystatin cream (MYCOSTATIN) Apply 1 Application topically See admin instructions. Apply to skin folds, under breasts, abdominal folds, and inguinal folds twice a day for candidasis   ondansetron (ZOFRAN) 4 MG tablet Take 1 tablet (4 mg total) by mouth every 6 (six) hours as needed.   prazosin (MINIPRESS) 2 MG capsule TAKE ONE CAPSULE BY MOUTH AT BEDTIME   predniSONE (DELTASONE) 20 MG tablet Take 1 tablet (20 mg total) by mouth 2 (two) times daily with a meal.   Semaglutide (RYBELSUS) 7 MG TABS Take 7 mg by mouth daily.   Specialty Vitamins Products (COLLAGEN ULTRA PO) Take by mouth.   Tetrahydrozoline-Zn Sulfate (EYE DROPS AR OP) Apply to eye.   traZODone (DESYREL) 50 MG tablet TAKE 2 TABLETS BY MOUTH AT BEDTIME   trospium (SANCTURA) 20 MG tablet Take 1 tablet (20 mg total) by mouth 2 (two) times daily.   Vibegron (GEMTESA) 75 MG TABS Take 75 mg by mouth daily.   vitamin B-12 (CYANOCOBALAMIN) 1000 MCG tablet Take 1,000 mcg by mouth daily.   [DISCONTINUED] Insulin Glargine (BASAGLAR KWIKPEN) 100 UNIT/ML Inject 36 Units into the skin at bedtime.   [DISCONTINUED] insulin lispro (HUMALOG) 100 UNIT/ML KwikPen Inject 5-8 Units into the skin 3 (three) times daily.   [DISCONTINUED] RYBELSUS 3 MG TABS TAKE 1 TABLET BY MOUTH DAILY FOR 28 DAYS   Insulin Glargine (BASAGLAR KWIKPEN) 100 UNIT/ML Inject 40 Units into the skin at bedtime.   insulin lispro (HUMALOG) 100 UNIT/ML KwikPen Inject 8-14 Units into the skin 3 (three) times daily.   No facility-administered encounter medications on file as of 11/29/2022.    ALLERGIES: Allergies  Allergen Reactions   Ambien [Zolpidem Tartrate] Other (See Comments)    Hallucinations/nightmares   Bee Venom Swelling   Coconut Flavor Swelling    Mouth swells   Codeine Nausea And Vomiting   Onion Nausea Only and Other (See Comments)    Raw onions   Other      Ground Beef (patient avoid due to side effect of diarrhea)   Sulfa Antibiotics Rash and Other (See  Comments)    Including topical sulfa  (mouth dries/peels/skin blisters)    VACCINATION STATUS: Immunization History  Administered Date(s) Administered   Pneumococcal Polysaccharide-23 08/22/2022   Tdap 05/29/2005, 05/22/2022   Zoster Recombinat (Shingrix) 05/22/2022    Diabetes She presents for her follow-up diabetic visit. She has type 2 diabetes mellitus. Onset time: Diagnosed at approx age of 76. Her disease course has been improving. There are no hypoglycemic associated symptoms. Associated symptoms include foot paresthesias and weight loss. There are no hypoglycemic complications. Symptoms are stable. There are no diabetic complications. Risk factors for coronary artery disease include diabetes mellitus, family history, obesity, sedentary lifestyle and post-menopausal. Current diabetic treatment includes intensive insulin program and oral agent (monotherapy). She is compliant with treatment most of the time. Her weight is decreasing steadily. She is following a generally healthy diet. Meal planning includes avoidance of concentrated sweets. She has not had a previous visit with a dietitian. She participates in exercise daily. Her home blood glucose trend is fluctuating minimally. Her overall blood glucose range is 180-200 mg/dl. (She presents today with her logs, no meter and CGM (requesting assistance starting new sensor) showing above target glycemic profile overall.  Her POCT A1c today is 8.8%, improving slightly from last visit of 9.3%.  She reports increased stress lately, has been dealing with caring for her husband which is physically and mentally demanding as well as financial issues.  She denies any hypoglycemia.) An ACE inhibitor/angiotensin II receptor blocker is being taken. She sees a podiatrist.Eye exam is current.    Review of systems  Constitutional: + Minimally fluctuating  body weight, current Body mass index is 32.51 kg/m., no fatigue, no subjective hyperthermia, no subjective hypothermia Eyes: no blurry vision, no xerophthalmia ENT: no sore throat, no nodules palpated in throat, no dysphagia/odynophagia, no hoarseness Cardiovascular: no chest pain, no shortness of breath, no palpitations, no leg swelling Respiratory: no cough, no shortness of breath Gastrointestinal: no nausea/vomiting/diarrhea Musculoskeletal: no muscle/joint aches Skin: no rashes, no hyperemia Neurological: no tremors, + numbness/tingling to BLE and bilateral hands, no dizziness Psychiatric: no depression, no anxiety  Objective:     BP 117/68 (BP Location: Right Arm, Patient Position: Sitting, Cuff Size: Large)   Pulse 66   Ht '5\' 6"'$  (1.676 m)   Wt 201 lb 6.4 oz (91.4 kg)   BMI 32.51 kg/m   Wt Readings from Last 3 Encounters:  11/29/22 201 lb 6.4 oz (91.4 kg)  08/30/22 206 lb 9.6 oz (93.7 kg)  08/22/22 206 lb 1.9 oz (93.5 kg)     BP Readings from Last 3 Encounters:  11/29/22 117/68  09/13/22 136/60  08/30/22 (!) 142/84      Physical Exam- Limited  Constitutional:  Body mass index is 32.51 kg/m. , not in acute distress, normal state of mind Eyes:  EOMI, no exophthalmos Musculoskeletal: no gross deformities, strength intact in all four extremities, no gross restriction of joint movements Skin:  no rashes, no hyperemia Neurological: no tremor with outstretched hands    CMP ( most recent) CMP     Component Value Date/Time   NA 136 08/22/2022 1023   K 4.4 08/22/2022 1023   CL 99 08/22/2022 1023   CO2 23 08/22/2022 1023   GLUCOSE 211 (H) 08/22/2022 1023   GLUCOSE 102 (H) 04/25/2021 1343   BUN 11 08/22/2022 1023   CREATININE 0.67 08/22/2022 1023   CALCIUM 9.9 08/22/2022 1023   PROT 6.7 08/22/2022 1023   ALBUMIN 4.1 08/22/2022 1023   AST  28 08/22/2022 1023   ALT 25 08/22/2022 1023   ALKPHOS 40 (L) 08/22/2022 1023   BILITOT 0.4 08/22/2022 1023   GFRNONAA >60  04/25/2021 1343   GFRAA >60 09/12/2019 0239     Diabetic Labs (most recent): Lab Results  Component Value Date   HGBA1C 8.8 (A) 11/29/2022   HGBA1C 9.3 (H) 08/22/2022   HGBA1C 7.7 (H) 05/22/2022     Lipid Panel ( most recent) Lipid Panel     Component Value Date/Time   CHOL 131 08/22/2022 1023   TRIG 106 08/22/2022 1023   HDL 48 08/22/2022 1023   CHOLHDL 2.7 08/22/2022 1023   LDLCALC 64 08/22/2022 1023   LABVLDL 19 08/22/2022 1023      Lab Results  Component Value Date   TSH 0.317 (L) 08/22/2022   TSH 0.403 (L) 05/22/2022   FREET4 1.46 08/22/2022   FREET4 1.52 05/22/2022           Assessment & Plan:   1) Type 2 diabetes mellitus without complication, with long-term current use of insulin ([place])  She presents today with her logs, no meter and CGM (requesting assistance starting new sensor) showing above target glycemic profile overall.  Her POCT A1c today is 8.8%, improving slightly from last visit of 9.3%.  She reports increased stress lately, has been dealing with caring for her husband which is physically and mentally demanding as well as financial issues.  She denies any hypoglycemia.  - [name] has currently uncontrolled symptomatic type 2 DM since 71 years of age.   -Recent labs reviewed.  - I had a long discussion with her about the progressive nature of diabetes and the pathology behind its complications. -her diabetes is not currently complicated but she remains at a high risk for more acute and chronic complications which include CAD, CVA, CKD, retinopathy, and neuropathy. These are all discussed in detail with her.  The following Lifestyle Medicine recommendations according to [facility]) were discussed and offered to patient and she agrees to start the journey:  A. Whole Foods, Plant-based plate comprising of fruits and vegetables, plant-based proteins, whole-grain carbohydrates was discussed in detail with the  patient.   A list for source of those nutrients were also provided to the patient.  Patient will use only water or unsweetened tea for hydration. B.  The need to stay away from risky substances including alcohol, smoking; obtaining 7 to 9 hours of restorative sleep, at least 150 minutes of moderate intensity exercise weekly, the importance of healthy social connections,  and stress reduction techniques were discussed. C.  A full color page of  Calorie density of various food groups per pound showing examples of each food groups was provided to the patient.  - Nutritional counseling repeated at each appointment due to patients tendency to fall back in to old habits.  - The patient admits there is a room for improvement in their diet and drink choices. -  Suggestion is made for the patient to avoid simple carbohydrates from their diet including Cakes, Sweet Desserts / Pastries, Ice Cream, Soda (diet and regular), Sweet Tea, Candies, Chips, Cookies, Sweet Pastries, Store Bought Juices, Alcohol in Excess of 1-2 drinks a day, Artificial Sweeteners, Coffee Creamer, and "Sugar-free" Products. This will help patient to have stable blood glucose profile and potentially avoid unintended weight gain.   - I encouraged the patient to switch to unprocessed or minimally processed complex starch and increased protein intake (animal or plant  source), fruits, and vegetables.   - Patient is advised to stick to a routine mealtimes to eat 3 meals a day and avoid unnecessary snacks (to snack only to correct hypoglycemia).  - I have approached her with the following individualized plan to manage her diabetes and patient agrees:   -Based on her hyperglycemia overall, she is advised to increase her Lantus to 40 units SQ nightly and adjust her Humalog 8-14 units TID with meals if glucose is above 90 and she is eating (Specific instructions on how to titrate insulin dosage based on glucose readings given to patient in writing).   Her PCP started her on Rybelsus 3 mg approximately 2 months ago.  Will increase Rybelsus to more therapeutic dose of 7 mg po daily.   -she is encouraged to continue monitoring glucose 4 times daily (using her CGM), before meals and before bed, and to call the clinic if she has readings less than 70 or above 300 for 3 tests in a row.    - she is warned not to take insulin without proper monitoring per orders. - Adjustment parameters are given to her for hypo and hyperglycemia in writing.  - she is not a candidate for sulfa meds due to allergy.  - Specific targets for  A1c; LDL, HDL, and Triglycerides were discussed with the patient.  2) Blood Pressure /Hypertension:  her blood pressure is controlled to target without the use of antihypertensive medications.  3) Lipids/Hyperlipidemia:    Review of her recent lipid panel from 05/22/22 showed controlled LDL at 68 .  she is advised to continue Lipitor 20 mg daily at bedtime.  Side effects and precautions discussed with her.  4)  Weight/Diet:  her Body mass index is 32.51 kg/m.  -  clearly complicating her diabetes care.   she is a candidate for weight loss. I discussed with her the fact that loss of 5 - 10% of her  current body weight will have the most impact on her diabetes management.  Exercise, and detailed carbohydrates information provided  -  detailed on discharge instructions.  5) Hypothyroidism-unspecified There are no recent TFTs to review.  She is advised to continue Levothyroxine 112 mcg po daily before breakfast.  Will recheck prior to next visit.   - The correct intake of thyroid hormone (Levothyroxine, Synthroid), is on empty stomach first thing in the morning, with water, separated by at least 30 minutes from breakfast and other medications,  and separated by more than 4 hours from calcium, iron, multivitamins, acid reflux medications (PPIs).  - This medication is a life-long medication and will be needed to correct thyroid  hormone imbalances for the rest of your life.  The dose may change from time to time, based on thyroid blood work.  - It is extremely important to be consistent taking this medication, near the same time each morning.  -AVOID TAKING PRODUCTS CONTAINING BIOTIN (commonly found in Hair, Skin, Nails vitamins) AS IT INTERFERES WITH THE VALIDITY OF THYROID FUNCTION BLOOD TESTS.  6) Chronic Care/Health Maintenance: -she is not on ACEI/ARB and is on Statin medications and is encouraged to initiate and continue to follow up with Ophthalmology, Dentist, Podiatrist at least yearly or according to recommendations, and advised to stay away from smoking. I have recommended yearly flu vaccine and pneumonia vaccine at least every 5 years; moderate intensity exercise for up to 150 minutes weekly; and sleep for at least 7 hours a day.  - she is advised to maintain  close follow up with Angelica Monday, FNP for primary care needs, as well as her other providers for optimal and coordinated care.     I spent 44 minutes in the care of the patient today including review of labs from [place], Lipids, Thyroid Function, Hematology (current and previous including abstractions from other facilities); face-to-face time discussing  her blood glucose readings/logs, discussing hypoglycemia and hyperglycemia episodes and symptoms, medications doses, her options of short and long term treatment based on the latest standards of care / guidelines;  discussion about incorporating lifestyle medicine;  and documenting the encounter. Risk reduction counseling performed per USPSTF guidelines to reduce obesity and cardiovascular risk factors.     Please refer to Patient Instructions for Blood Glucose Monitoring and Insulin/Medications Dosing Guide"  in media tab for additional information. Please  also refer to " Patient Self Inventory" in the Media  tab for reviewed elements of pertinent patient history.  [name] participated in the  discussions, expressed understanding, and voiced agreement with the above plans.  All questions were answered to her satisfaction. she is encouraged to contact clinic should she have any questions or concerns prior to her return visit.     Follow up plan: - Return in about 3 months (around 02/28/2023) for Diabetes F/U with A1c in office, Previsit labs, Thyroid follow up, Bring meter and logs.   Rayetta Pigg, Ms [facility] [address] [place], [place] [address] Phone: [phone] Fax: [phone]  11/29/2022, 4:16 PM

## 2022-12-19 ENCOUNTER — Encounter: Payer: 59 | Admitting: Family Medicine

## 2022-12-25 ENCOUNTER — Other Ambulatory Visit: Payer: Self-pay | Admitting: Family Medicine

## 2022-12-25 DIAGNOSIS — G4709 Other insomnia: Secondary | ICD-10-CM

## 2023-01-02 ENCOUNTER — Encounter: Payer: Self-pay | Admitting: Family Medicine

## 2023-01-02 ENCOUNTER — Ambulatory Visit (INDEPENDENT_AMBULATORY_CARE_PROVIDER_SITE_OTHER): Payer: 59 | Admitting: Family Medicine

## 2023-01-02 VITALS — BP 120/71 | HR 76 | Ht 66.0 in | Wt 201.0 lb

## 2023-01-02 DIAGNOSIS — Z0001 Encounter for general adult medical examination with abnormal findings: Secondary | ICD-10-CM | POA: Diagnosis not present

## 2023-01-02 DIAGNOSIS — E1100 Type 2 diabetes mellitus with hyperosmolarity without nonketotic hyperglycemic-hyperosmolar coma (NKHHC): Secondary | ICD-10-CM

## 2023-01-02 DIAGNOSIS — E038 Other specified hypothyroidism: Secondary | ICD-10-CM | POA: Diagnosis not present

## 2023-01-02 DIAGNOSIS — E559 Vitamin D deficiency, unspecified: Secondary | ICD-10-CM

## 2023-01-02 DIAGNOSIS — E7849 Other hyperlipidemia: Secondary | ICD-10-CM

## 2023-01-02 DIAGNOSIS — Z794 Long term (current) use of insulin: Secondary | ICD-10-CM

## 2023-01-02 NOTE — Patient Instructions (Addendum)
I appreciate the opportunity to provide care to you today!    Follow up:  3 months/ 1 year for physical exam  Labs: please stop by the lab during the week to get your blood drawn (CBC, CMP, TSH, Lipid profile, HgA1c, Vit D)  Please follow up for Eye Exam: [facility] [phone]  Due for shingles vaccine at your pharmacy   Please continue to a heart-healthy diet and increase your physical activities. Try to exercise for 76mns at least five times a week.      It was a pleasure to see you and I look forward to continuing to work together on your health and well-being. Please do not hesitate to call the office if you need care or have questions about your care.   Have a wonderful day and week. With Gratitude, GAlvira MondayMSN, FNP-BC

## 2023-01-02 NOTE — Progress Notes (Signed)
Complete physical exam  Patient: [name]   DOB: 1951/05/03   72 y.o. Female  MRN: [id number]  Subjective:    Chief Complaint  Patient presents with   Annual Exam    Cpe today. Has questions about trazodone medication unsure if she should be doing 1 or 2 tablets.     [name] is a 72 y.o. female who presents today for a complete physical exam. She reports consuming a general diet.  Takes  care of her husband  She generally feels well. She reports sleeping well. She does not have additional problems to discuss today.    Most recent fall risk assessment:    01/02/2023    2:14 PM  Fall Risk   Falls in the past year? 1  Number falls in past yr: 0  Injury with Fall? 1  Risk for fall due to : No Fall Risks;History of fall(s);Impaired balance/gait  Follow up Falls evaluation completed     Most recent depression screenings:    01/02/2023    2:14 PM 08/22/2022    9:08 AM  PHQ 2/9 Scores  PHQ - 2 Score 1 0  PHQ- 9 Score 5     Vision:Not within last year   Patient Active Problem List   Diagnosis Date Noted   Encounter for annual general medical examination with abnormal findings in adult 01/02/2023   Foot callus 08/22/2022   Neck pain 07/16/2022   Elevated blood pressure reading in office without diagnosis of hypertension 07/16/2022   Tinea corporis 06/21/2022   T2DM (type 2 diabetes mellitus) ([place]) 05/22/2022   Anxiety 05/22/2022   Hypothyroidism 05/22/2022   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   Dementia ([place]) 05/22/2022   Insomnia 05/22/2022   Urge incontinence 11/06/2021   Primary localized osteoarthritis of right knee 09/11/2019   Past Medical History:  Diagnosis Date   Anemia    as a teenager   Anxiety    Arthritis    knees, rt hand,hips   Cancer ([place])    vaginal,uterine,ovariam   COPD (chronic obstructive pulmonary disease) ([place])    Depression    Diabetes mellitus without complication ([place])    Hypothyroidism     Neuromuscular disorder ([place])    hands and feet   Past Surgical History:  Procedure Laterality Date   ABDOMINAL HYSTERECTOMY     BREAST SURGERY Right 1988   lumpectomy   EYE SURGERY Bilateral    FRACTURE SURGERY     Rt ankle,lt knee cap,lt shoulder,rt wrist,rt shoulder,   TONSILLECTOMY     at age 41   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   Procedure: TOTAL KNEE ARTHROPLASTY;  Surgeon: Earlie Server, MD;  Location: [facility];  Service: Orthopedics;  Laterality: Right;   Social History   Tobacco Use   Smoking status: Former    Packs/day: 0.50    Years: 15.00    Total pack years: 7.50    Types: Cigarettes    Quit date: 2013    Years since quitting: 11.0   Smokeless tobacco: Never  Vaping Use   Vaping Use: Never used  Substance Use Topics   Alcohol use: Never   Drug use: Never   Social History   Socioeconomic History   Marital status: Married    Spouse name: Not on file   Number of children: Not on file   Years of education: Not on file   Highest education level: Not on file  Occupational History   Not  on file  Tobacco Use   Smoking status: Former    Packs/day: 0.50    Years: 15.00    Total pack years: 7.50    Types: Cigarettes    Quit date: 2013    Years since quitting: 11.0   Smokeless tobacco: Never  Vaping Use   Vaping Use: Never used  Substance and Sexual Activity   Alcohol use: Never   Drug use: Never   Sexual activity: Not on file  Other Topics Concern   Not on file  Social History Narrative   Not on file   Social Determinants of Health   Financial Resource Strain: Not on file  Food Insecurity: Not on file  Transportation Needs: Not on file  Physical Activity: Not on file  Stress: Not on file  Social Connections: Not on file  Intimate Partner Violence: Not on file   Family Status  Relation Name Status   Mother  (Not Specified)   Father  (Not Specified)   Sister  (Not Specified)   Brother  (Not Specified)   Family History  Problem  Relation Age of Onset   Cancer Mother    Hypertension Mother    Thyroid disease Mother    Diabetes Mother    Stroke Mother    Heart failure Mother    Diabetes Father    Heart attack Father    Diabetes Sister    Diabetes Brother    Allergies  Allergen Reactions   Ambien [Zolpidem Tartrate] Other (See Comments)    Hallucinations/nightmares   Bee Venom Swelling   Coconut Flavor Swelling    Mouth swells   Codeine Nausea And Vomiting   Onion Nausea Only and Other (See Comments)    Raw onions   Other     Ground Beef (patient avoid due to side effect of diarrhea)   Sulfa Antibiotics Rash and Other (See Comments)    Including topical sulfa  (mouth dries/peels/skin blisters)      Patient Care Team: Alvira Monday, FNP as PCP - General (Family Medicine)   Outpatient Medications Prior to Visit  Medication Sig   acyclovir (ZOVIRAX) 400 MG tablet TAKE 1 TABLET BY MOUTH EVERY TWELVE HOURS (AT BREAKFAST AND AT BEDTIME)   alendronate (FOSAMAX) 70 MG tablet Take 1 tablet (70 mg total) by mouth every 7 (seven) days. Take with a full glass of water on an empty stomach.   Apple Cider Vinegar 300 MG TABS Take by mouth.   aspirin 81 MG chewable tablet Chew 1 tablet (81 mg total) by mouth 2 (two) times daily.   atorvastatin (LIPITOR) 20 MG tablet TAKE 1 TABLET BY MOUTH AT BEDTIME   Blood Pressure Monitoring (BLOOD PRESSURE MONITOR/L CUFF) MISC Dx: R03.0, elevated bp in office   cholecalciferol (VITAMIN D3) 25 MCG (1000 UNIT) tablet Take 1,000 Units by mouth daily.   clonazePAM (KLONOPIN) 0.5 MG tablet TAKE 1 TABLET BY MOUTH AT BEDTIME   donepezil (ARICEPT) 10 MG tablet TAKE 1 TABLET BY MOUTH EVERY DAY   EPINEPHrine 0.3 mg/0.3 mL IJ SOAJ injection Inject 0.3 mg into the muscle as needed for anaphylaxis.   escitalopram (LEXAPRO) 20 MG tablet TAKE 1 TABLET BY MOUTH EVERY DAY   glucose blood (TRUE METRIX BLOOD GLUCOSE TEST) test strip USE AS DIRECTED   Insulin Glargine (BASAGLAR KWIKPEN) 100  UNIT/ML Inject 40 Units into the skin at bedtime.   insulin lispro (HUMALOG) 100 UNIT/ML KwikPen Inject 8-14 Units into the skin 3 (three) times daily.   Insulin  Pen Needle (BD AUTOSHIELD DUO) 30G X 5 MM MISC Use 4 times daily as instructed   levothyroxine (SYNTHROID) 112 MCG tablet TAKE 1 TABLET BY MOUTH DAILY   loperamide (IMODIUM) 2 MG capsule Take 2 mg by mouth See admin instructions. Take 2 mg PO after each loose stool as needed for diarrhea (max of 3 doses in 24 hours)   methocarbamol (ROBAXIN) 500 MG tablet Take 1 tablet (500 mg total) by mouth 2 (two) times daily.   metoCLOPramide (REGLAN) 5 MG tablet TAKE 1 TABLET BY MOUTH FOUR TIMES DAILY   nystatin cream (MYCOSTATIN) Apply 1 Application topically See admin instructions. Apply to skin folds, under breasts, abdominal folds, and inguinal folds twice a day for candidasis   ondansetron (ZOFRAN) 4 MG tablet Take 1 tablet (4 mg total) by mouth every 6 (six) hours as needed.   polyethylene glycol powder (GLYCOLAX/MIRALAX) 17 GM/SCOOP powder SMARTSIG:1 Capful(s) By Mouth Daily   prazosin (MINIPRESS) 2 MG capsule TAKE ONE CAPSULE BY MOUTH AT BEDTIME   predniSONE (DELTASONE) 20 MG tablet Take 1 tablet (20 mg total) by mouth 2 (two) times daily with a meal.   Semaglutide (RYBELSUS) 7 MG TABS Take 7 mg by mouth daily.   Specialty Vitamins Products (COLLAGEN ULTRA PO) Take by mouth.   Tetrahydrozoline-Zn Sulfate (EYE DROPS AR OP) Apply to eye.   traZODone (DESYREL) 50 MG tablet TAKE 2 TABLETS BY MOUTH AT BEDTIME   trospium (SANCTURA) 20 MG tablet Take 1 tablet (20 mg total) by mouth 2 (two) times daily.   Vibegron (GEMTESA) 75 MG TABS Take 75 mg by mouth daily.   vitamin B-12 (CYANOCOBALAMIN) 1000 MCG tablet Take 1,000 mcg by mouth daily.   No facility-administered medications prior to visit.    Review of Systems  Constitutional:  Negative for chills, fever and malaise/fatigue.  HENT:  Negative for congestion and sinus pain.   Eyes:  Negative  for pain, discharge and redness.  Respiratory:  Negative for cough, sputum production and shortness of breath.   Cardiovascular:  Negative for chest pain, palpitations, claudication and leg swelling.  Gastrointestinal:  Negative for diarrhea, heartburn and nausea.  Genitourinary:  Negative for flank pain and frequency.  Musculoskeletal:  Negative for neck pain.  Skin:  Negative for itching.  Neurological:  Negative for dizziness, seizures and headaches.  Endo/Heme/Allergies:  Negative for environmental allergies.  Psychiatric/Behavioral:  Negative for memory loss. The patient does not have insomnia.        Objective:    BP 120/71   Pulse 76   Ht '5\' 6"'$  (1.676 m)   Wt 201 lb 0.6 oz (91.2 kg)   SpO2 96%   BMI 32.45 kg/m  BP Readings from Last 3 Encounters:  01/02/23 120/71  11/29/22 117/68  09/13/22 136/60   Wt Readings from Last 3 Encounters:  01/02/23 201 lb 0.6 oz (91.2 kg)  11/29/22 201 lb 6.4 oz (91.4 kg)  08/30/22 206 lb 9.6 oz (93.7 kg)      Physical Exam HENT:     Head: Normocephalic.     Left Ear: External ear normal.     Mouth/Throat:     Mouth: Mucous membranes are moist.  Eyes:     Extraocular Movements: Extraocular movements intact.     Pupils: Pupils are equal, round, and reactive to light.  Cardiovascular:     Rate and Rhythm: Normal rate and regular rhythm.     Heart sounds: Normal heart sounds. No murmur heard. Pulmonary:  Effort: Pulmonary effort is normal.     Breath sounds: Normal breath sounds.  Abdominal:     Palpations: Abdomen is soft.     Tenderness: There is no right CVA tenderness or left CVA tenderness.  Musculoskeletal:     Right lower leg: No edema.     Left lower leg: No edema.  Skin:    Findings: No lesion or rash.  Neurological:     Mental Status: She is alert and oriented to person, place, and time.     GCS: GCS eye subscore is 4. GCS verbal subscore is 5. GCS motor subscore is 6.     Cranial Nerves: No facial asymmetry.      Sensory: No sensory deficit.     Motor: No weakness.     Coordination: Coordination normal. Finger-Nose-Finger Test normal.     Gait: Gait normal.  Psychiatric:        Judgment: Judgment normal.     No results found for any visits on 01/02/23. Last CBC Lab Results  Component Value Date   WBC 4.6 08/22/2022   HGB 13.3 08/22/2022   HCT 39.8 08/22/2022   MCV 86 08/22/2022   MCH 28.9 08/22/2022   RDW 13.7 08/22/2022   PLT 202 47/65/4650   Last metabolic panel Lab Results  Component Value Date   GLUCOSE 211 (H) 08/22/2022   NA 136 08/22/2022   K 4.4 08/22/2022   CL 99 08/22/2022   CO2 23 08/22/2022   BUN 11 08/22/2022   CREATININE 0.67 08/22/2022   EGFR 92 08/22/2022   CALCIUM 9.9 08/22/2022   PROT 6.7 08/22/2022   ALBUMIN 4.1 08/22/2022   LABGLOB 2.6 08/22/2022   AGRATIO 1.6 08/22/2022   BILITOT 0.4 08/22/2022   ALKPHOS 40 (L) 08/22/2022   AST 28 08/22/2022   ALT 25 08/22/2022   ANIONGAP 8 04/25/2021   Last lipids Lab Results  Component Value Date   CHOL 131 08/22/2022   HDL 48 08/22/2022   LDLCALC 64 08/22/2022   TRIG 106 08/22/2022   CHOLHDL 2.7 08/22/2022   Last hemoglobin A1c Lab Results  Component Value Date   HGBA1C 8.8 (A) 11/29/2022   Last thyroid functions Lab Results  Component Value Date   TSH 0.317 (L) 08/22/2022   Last vitamin D Lab Results  Component Value Date   VD25OH 36.7 08/22/2022   Last vitamin B12 and Folate No results found for: "VITAMINB12", "FOLATE"      Assessment & Plan:    Routine Health Maintenance and Physical Exam  Immunization History  Administered Date(s) Administered   Fluad Quad(high Dose 65+) 12/03/2022   Pneumococcal Polysaccharide-23 08/22/2022   Tdap 05/29/2005, 05/22/2022   Zoster Recombinat (Shingrix) 05/22/2022    Health Maintenance  Topic Date Due   COVID-19 Vaccine (1) Never done   OPHTHALMOLOGY EXAM  Never done   Diabetic kidney evaluation - Urine ACR  Never done   Zoster Vaccines-  Shingrix (2 of 2) 07/17/2022   HEMOGLOBIN A1C  05/31/2023   Medicare Annual Wellness (AWV)  06/19/2023   Diabetic kidney evaluation - eGFR measurement  08/23/2023   Pneumonia Vaccine 46+ Years old (2 - PCV) 08/23/2023   FOOT EXAM  08/23/2023   MAMMOGRAM  09/28/2024   Fecal DNA (Cologuard)  06/29/2025   DTaP/Tdap/Td (3 - Td or Tdap) 05/22/2032   INFLUENZA VACCINE  Completed   DEXA SCAN  Completed   Hepatitis C Screening  Completed   HPV VACCINES  Aged Out    Discussed  health benefits of physical activity, and encouraged her to engage in regular exercise appropriate for her age and condition.  Encounter for annual general medical examination with abnormal findings in adult Assessment & Plan: Physical exam as documented Counseling is done on healthy lifestyle involving commitment to 150 minutes of exercise per week,  Discussed heart-healthy diet and attaining a healthy weight She reports that she will get her shingles vaccination at the pharmacy      Type 2 diabetes mellitus with hyperosmolarity without coma, with long-term current use of insulin (HCC) -     Microalbumin / creatinine urine ratio -     Hemoglobin A1c  Other specified hypothyroidism -     TSH + free T4  Other hyperlipidemia -     Lipid panel -     CMP14+EGFR -     CBC with Differential/Platelet  Vitamin D deficiency -     VITAMIN D 25 Hydroxy (Vit-D Deficiency, Fractures)    Return in about 1 year (around 01/03/2024) for CPE.     Alvira Monday, FNP

## 2023-01-02 NOTE — Assessment & Plan Note (Signed)
Physical exam as documented Counseling is done on healthy lifestyle involving commitment to 150 minutes of exercise per week,  Discussed heart-healthy diet and attaining a healthy weight She reports that she will get her shingles vaccination at the pharmacy

## 2023-01-07 ENCOUNTER — Other Ambulatory Visit: Payer: Self-pay | Admitting: Family Medicine

## 2023-01-07 DIAGNOSIS — B379 Candidiasis, unspecified: Secondary | ICD-10-CM

## 2023-01-25 ENCOUNTER — Other Ambulatory Visit (HOSPITAL_COMMUNITY): Payer: Self-pay

## 2023-01-29 ENCOUNTER — Other Ambulatory Visit: Payer: Self-pay | Admitting: Nurse Practitioner

## 2023-02-08 ENCOUNTER — Other Ambulatory Visit: Payer: Self-pay | Admitting: Family Medicine

## 2023-02-08 DIAGNOSIS — B009 Herpesviral infection, unspecified: Secondary | ICD-10-CM

## 2023-02-08 DIAGNOSIS — F419 Anxiety disorder, unspecified: Secondary | ICD-10-CM

## 2023-02-23 LAB — MICROALBUMIN / CREATININE URINE RATIO

## 2023-02-25 ENCOUNTER — Other Ambulatory Visit: Payer: Self-pay | Admitting: Family Medicine

## 2023-02-25 DIAGNOSIS — Z794 Long term (current) use of insulin: Secondary | ICD-10-CM

## 2023-02-25 DIAGNOSIS — E559 Vitamin D deficiency, unspecified: Secondary | ICD-10-CM

## 2023-02-25 MED ORDER — VITAMIN D (ERGOCALCIFEROL) 1.25 MG (50000 UNIT) PO CAPS: 50000.0000 [IU] | ORAL_CAPSULE | ORAL | 1 refills | Status: DC

## 2023-02-25 MED ORDER — RYBELSUS 14 MG PO TABS: 14.0000 mg | ORAL_TABLET | Freq: Every day | ORAL | 2 refills | Status: DC

## 2023-02-25 NOTE — Progress Notes (Signed)
A weekly vitamin D supplement prescription has been sent to your pharmacy because your vitamin D is low. Your hemoglobin A1c has decreased from 8.8 to 8.5.  I have increased your Rybelsus from 7 mg to 40 mg daily, please pick up your prescription at the pharmacy and start therapy.  I recommend decreasing your intake of foods high in sugar.

## 2023-02-26 ENCOUNTER — Telehealth: Payer: Self-pay

## 2023-02-26 ENCOUNTER — Other Ambulatory Visit (HOSPITAL_COMMUNITY): Payer: Self-pay

## 2023-02-26 DIAGNOSIS — E119 Type 2 diabetes mellitus without complications: Secondary | ICD-10-CM

## 2023-02-26 MED ORDER — INSULIN GLARGINE 100 UNIT/ML SOLOSTAR PEN: 40.0000 [IU] | PEN_INJECTOR | Freq: Every day | SUBCUTANEOUS | 0 refills | Status: DC

## 2023-02-26 NOTE — Telephone Encounter (Signed)
Rx for lantus 40 units qhs sent to [facility] per Rayetta Pigg, FNP.

## 2023-02-26 NOTE — Telephone Encounter (Signed)
Nope, she can be changed to any of those preferred by ins.

## 2023-02-26 NOTE — Telephone Encounter (Signed)
PA request received via [facility] for Basaglar KwikPen 100UNIT/ML pen-injectors  Key: [id number]  PA not submitted at this time due to question of preferred alternatives.

## 2023-02-26 NOTE — Telephone Encounter (Signed)
Any contraindication to changing pt's insulin?

## 2023-02-28 ENCOUNTER — Ambulatory Visit (INDEPENDENT_AMBULATORY_CARE_PROVIDER_SITE_OTHER): Payer: 59

## 2023-02-28 ENCOUNTER — Ambulatory Visit: Payer: 59 | Admitting: Nurse Practitioner

## 2023-02-28 VITALS — BP 152/79 | HR 82 | Wt 202.0 lb

## 2023-02-28 DIAGNOSIS — E1100 Type 2 diabetes mellitus with hyperosmolarity without nonketotic hyperglycemic-hyperosmolar coma (NKHHC): Secondary | ICD-10-CM

## 2023-02-28 DIAGNOSIS — Z794 Long term (current) use of insulin: Secondary | ICD-10-CM | POA: Diagnosis not present

## 2023-02-28 NOTE — Patient Instructions (Signed)
Call with any questions concerns

## 2023-02-28 NOTE — Progress Notes (Signed)
Went over application instructions for [employer]. Sensor was place on pts L arm.

## 2023-03-07 ENCOUNTER — Encounter: Payer: Self-pay | Admitting: Nurse Practitioner

## 2023-03-07 ENCOUNTER — Ambulatory Visit (INDEPENDENT_AMBULATORY_CARE_PROVIDER_SITE_OTHER): Payer: 59 | Admitting: Nurse Practitioner

## 2023-03-07 VITALS — BP 150/78 | HR 61 | Ht 66.0 in | Wt 201.0 lb

## 2023-03-07 DIAGNOSIS — E039 Hypothyroidism, unspecified: Secondary | ICD-10-CM | POA: Diagnosis not present

## 2023-03-07 DIAGNOSIS — Z794 Long term (current) use of insulin: Secondary | ICD-10-CM

## 2023-03-07 DIAGNOSIS — E119 Type 2 diabetes mellitus without complications: Secondary | ICD-10-CM

## 2023-03-07 DIAGNOSIS — E559 Vitamin D deficiency, unspecified: Secondary | ICD-10-CM | POA: Diagnosis not present

## 2023-03-07 LAB — CBC WITH DIFFERENTIAL/PLATELET
Basophils Absolute: 0 10*3/uL (ref 0.0–0.2)
Basos: 1 %
EOS (ABSOLUTE): 0.1 10*3/uL (ref 0.0–0.4)
Eos: 1 %
Hematocrit: 37.6 % (ref 34.0–46.6)
Hemoglobin: 12.2 g/dL (ref 11.1–15.9)
Immature Grans (Abs): 0 10*3/uL (ref 0.0–0.1)
Immature Granulocytes: 0 %
Lymphocytes Absolute: 1.7 10*3/uL (ref 0.7–3.1)
Lymphs: 28 %
MCH: 27.7 pg (ref 26.6–33.0)
MCHC: 32.4 g/dL (ref 31.5–35.7)
MCV: 85 fL (ref 79–97)
Monocytes Absolute: 0.6 10*3/uL (ref 0.1–0.9)
Monocytes: 10 %
Neutrophils Absolute: 3.7 10*3/uL (ref 1.4–7.0)
Neutrophils: 60 %
Platelets: 248 10*3/uL (ref 150–450)
RBC: 4.41 x10E6/uL (ref 3.77–5.28)
RDW: 13.6 % (ref 11.7–15.4)
WBC: 6.1 10*3/uL (ref 3.4–10.8)

## 2023-03-07 LAB — CMP14+EGFR
ALT: 33 IU/L — ABNORMAL HIGH (ref 0–32)
AST: 36 IU/L (ref 0–40)
Albumin/Globulin Ratio: 1.7 (ref 1.2–2.2)
Albumin: 4.2 g/dL (ref 3.8–4.8)
Alkaline Phosphatase: 41 IU/L — ABNORMAL LOW (ref 44–121)
BUN/Creatinine Ratio: 21 (ref 12–28)
BUN: 14 mg/dL (ref 8–27)
Bilirubin Total: 0.5 mg/dL (ref 0.0–1.2)
CO2: 23 mmol/L (ref 20–29)
Calcium: 9.6 mg/dL (ref 8.7–10.3)
Chloride: 100 mmol/L (ref 96–106)
Creatinine, Ser: 0.67 mg/dL (ref 0.57–1.00)
Globulin, Total: 2.5 g/dL (ref 1.5–4.5)
Glucose: 115 mg/dL — ABNORMAL HIGH (ref 70–99)
Potassium: 4.5 mmol/L (ref 3.5–5.2)
Sodium: 139 mmol/L (ref 134–144)
Total Protein: 6.7 g/dL (ref 6.0–8.5)
eGFR: 92 mL/min/{1.73_m2} (ref >59)

## 2023-03-07 LAB — HEMOGLOBIN A1C
Est. average glucose Bld gHb Est-mCnc: 197 mg/dL
Hgb A1c MFr Bld: 8.5 % — ABNORMAL HIGH (ref 4.8–5.6)

## 2023-03-07 LAB — TSH+FREE T4
Free T4: 1.25 ng/dL (ref 0.82–1.77)
TSH: 0.648 u[IU]/mL (ref 0.450–4.500)

## 2023-03-07 LAB — MICROALBUMIN / CREATININE URINE RATIO

## 2023-03-07 LAB — LIPID PANEL
Chol/HDL Ratio: 2.7 ratio (ref 0.0–4.4)
Cholesterol, Total: 140 mg/dL (ref 100–199)
HDL: 52 mg/dL (ref >39)
LDL Chol Calc (NIH): 68 mg/dL (ref 0–99)
Triglycerides: 107 mg/dL (ref 0–149)
VLDL Cholesterol Cal: 20 mg/dL (ref 5–40)

## 2023-03-07 LAB — VITAMIN D 25 HYDROXY (VIT D DEFICIENCY, FRACTURES): Vit D, 25-Hydroxy: 21.4 ng/mL — ABNORMAL LOW (ref 30.0–100.0)

## 2023-03-07 LAB — SPECIMEN STATUS REPORT

## 2023-03-07 NOTE — Progress Notes (Signed)
Endocrinology Follow Up Note       03/07/2023, 1:45 PM   Subjective:    Patient ID: [name], female    DOB: March 13, 1951.  [name] is being seen in follow up after being seen in consultation for management of currently uncontrolled symptomatic diabetes requested by  [name], [place].   Past Medical History:  Diagnosis Date   Anemia    as a teenager   Anxiety    Arthritis    knees, rt hand,hips   Cancer (HCC)    vaginal,uterine,ovariam   COPD (chronic obstructive pulmonary disease) (HCC)    Depression    Diabetes mellitus without complication (HCC)    Hypothyroidism    Neuromuscular disorder ([place])    hands and feet    Past Surgical History:  Procedure Laterality Date   ABDOMINAL HYSTERECTOMY     BREAST SURGERY Right 1988   lumpectomy   EYE SURGERY Bilateral    FRACTURE SURGERY     Rt ankle,lt knee cap,lt shoulder,rt wrist,rt shoulder,   TONSILLECTOMY     at age 89   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   Procedure: TOTAL KNEE ARTHROPLASTY;  Surgeon: Earlie Server, MD;  Location: [facility];  Service: Orthopedics;  Laterality: Right;    Social History   Socioeconomic History   Marital status: Married    Spouse name: Not on file   Number of children: Not on file   Years of education: Not on file   Highest education level: Not on file  Occupational History   Not on file  Tobacco Use   Smoking status: Former    Packs/day: 0.50    Years: 15.00    Additional pack years: 0.00    Total pack years: 7.50    Types: Cigarettes    Quit date: 2013    Years since quitting: 11.2   Smokeless tobacco: Never  Vaping Use   Vaping Use: Never used  Substance and Sexual Activity   Alcohol use: Never   Drug use: Never   Sexual activity: Not on file  Other Topics Concern   Not on file  Social History Narrative   Not on file   Social Determinants of Health   Financial  Resource Strain: Not on file  Food Insecurity: Not on file  Transportation Needs: Not on file  Physical Activity: Not on file  Stress: Not on file  Social Connections: Not on file    Family History  Problem Relation Age of Onset   Cancer Mother    Hypertension Mother    Thyroid disease Mother    Diabetes Mother    Stroke Mother    Heart failure Mother    Diabetes Father    Heart attack Father    Diabetes Sister    Diabetes Brother     Outpatient Encounter Medications as of 03/07/2023  Medication Sig   acyclovir (ZOVIRAX) 400 MG tablet TAKE 1 TABLET BY MOUTH EVERY TWELVE HOURS (AT BREAKFAST AND AT BEDTIME)   alendronate (FOSAMAX) 70 MG tablet Take 1 tablet (70 mg total) by mouth every 7 (seven) days. Take with a full glass of water on an empty stomach.   Apple  Cider Vinegar 300 MG TABS Take by mouth.   aspirin 81 MG chewable tablet Chew 1 tablet (81 mg total) by mouth 2 (two) times daily.   atorvastatin (LIPITOR) 20 MG tablet TAKE 1 TABLET BY MOUTH AT BEDTIME   Blood Pressure Monitoring (BLOOD PRESSURE MONITOR/L CUFF) MISC Dx: R03.0, elevated bp in office   clonazePAM (KLONOPIN) 0.5 MG tablet TAKE 1 TABLET BY MOUTH AT BEDTIME   donepezil (ARICEPT) 10 MG tablet TAKE 1 TABLET BY MOUTH EVERY DAY   EPINEPHrine 0.3 mg/0.3 mL IJ SOAJ injection Inject 0.3 mg into the muscle as needed for anaphylaxis.   escitalopram (LEXAPRO) 20 MG tablet TAKE 1 TABLET BY MOUTH EVERY DAY   glucose blood (TRUE METRIX BLOOD GLUCOSE TEST) test strip USE AS DIRECTED   insulin glargine (LANTUS) 100 UNIT/ML Solostar Pen Inject 40 Units into the skin at bedtime.   insulin lispro (HUMALOG) 100 UNIT/ML KwikPen Inject 8-14 Units into the skin 3 (three) times daily.   Insulin Pen Needle (BD AUTOSHIELD DUO) 30G X 5 MM MISC Use 4 times daily as instructed   levothyroxine (SYNTHROID) 112 MCG tablet TAKE 1 TABLET BY MOUTH DAILY   loperamide (IMODIUM) 2 MG capsule Take 2 mg by mouth See admin instructions. Take 2 mg PO  after each loose stool as needed for diarrhea (max of 3 doses in 24 hours)   methocarbamol (ROBAXIN) 500 MG tablet Take 1 tablet (500 mg total) by mouth 2 (two) times daily.   metoCLOPramide (REGLAN) 5 MG tablet TAKE 1 TABLET BY MOUTH FOUR TIMES DAILY   nystatin cream (MYCOSTATIN) APPLY TO SKIN FOLDS, UNDER BREASTS, ABDOMINAL FOLDS AND INGUINAL FOLDS TWICE DAILY FOR CANDIDASIS   ondansetron (ZOFRAN) 4 MG tablet Take 1 tablet (4 mg total) by mouth every 6 (six) hours as needed.   polyethylene glycol powder (GLYCOLAX/MIRALAX) 17 GM/SCOOP powder SMARTSIG:1 Capful(s) By Mouth Daily   prazosin (MINIPRESS) 2 MG capsule TAKE ONE CAPSULE BY MOUTH AT BEDTIME   predniSONE (DELTASONE) 20 MG tablet Take 1 tablet (20 mg total) by mouth 2 (two) times daily with a meal.   Semaglutide (RYBELSUS) 14 MG TABS Take 1 tablet (14 mg total) by mouth daily.   Specialty Vitamins Products (COLLAGEN ULTRA PO) Take by mouth.   Tetrahydrozoline-Zn Sulfate (EYE DROPS AR OP) Apply to eye.   traZODone (DESYREL) 50 MG tablet TAKE 2 TABLETS BY MOUTH AT BEDTIME   trospium (SANCTURA) 20 MG tablet Take 1 tablet (20 mg total) by mouth 2 (two) times daily.   Vibegron (GEMTESA) 75 MG TABS Take 75 mg by mouth daily.   vitamin B-12 (CYANOCOBALAMIN) 1000 MCG tablet Take 1,000 mcg by mouth daily.   Vitamin D, Ergocalciferol, (DRISDOL) 1.25 MG (50000 UNIT) CAPS capsule Take 1 capsule (50,000 Units total) by mouth every 7 (seven) days.   cholecalciferol (VITAMIN D3) 25 MCG (1000 UNIT) tablet Take 1,000 Units by mouth daily. (Patient not taking: Reported on 03/07/2023)   No facility-administered encounter medications on file as of 03/07/2023.    ALLERGIES: Allergies  Allergen Reactions   Ambien [Zolpidem Tartrate] Other (See Comments)    Hallucinations/nightmares   Bee Venom Swelling   Coconut Flavor Swelling    Mouth swells   Codeine Nausea And Vomiting   Onion Nausea Only and Other (See Comments)    Raw onions   Other     Ground  Beef (patient avoid due to side effect of diarrhea)   Sulfa Antibiotics Rash and Other (See Comments)    Including  topical sulfa  (mouth dries/peels/skin blisters)    VACCINATION STATUS: Immunization History  Administered Date(s) Administered   Fluad Quad(high Dose 65+) 12/03/2022   Pneumococcal Polysaccharide-23 08/22/2022   Tdap 05/29/2005, 05/22/2022   Zoster Recombinat (Shingrix) 05/22/2022    Diabetes She presents for her follow-up diabetic visit. She has type 2 diabetes mellitus. Onset time: Diagnosed at approx age of 54. Her disease course has been improving. There are no hypoglycemic associated symptoms. Associated symptoms include foot paresthesias. There are no hypoglycemic complications. Symptoms are stable. There are no diabetic complications. Risk factors for coronary artery disease include diabetes mellitus, family history, obesity, sedentary lifestyle and post-menopausal. Current diabetic treatment includes intensive insulin program and oral agent (monotherapy). She is compliant with treatment most of the time. Her weight is fluctuating minimally. She is following a generally healthy diet. Meal planning includes avoidance of concentrated sweets. She has not had a previous visit with a dietitian. She participates in exercise daily. Her home blood glucose trend is decreasing steadily. Her overall blood glucose range is 180-200 mg/dl. (She presents today with her logs, no meter and CGM (requesting assistance starting new sensor) showing above target glycemic profile overall.  Her most recent A1c, checked by her PCP on 3/1, was 8.5%, improving slightly from last visit of 8.8%.  She says she has had trouble getting her sensors to stay.  She denies any hypoglycemia.  She has tolerated the addition of Rybelsus well, PCP increased her dose to 14 mg which she has not yet picked up, but she has been taking 2 of her 7 mg tabs in the interim.) An ACE inhibitor/angiotensin II receptor blocker is  being taken. She sees a podiatrist.Eye exam is current.    Review of systems  Constitutional: + Minimally fluctuating body weight, current Body mass index is 32.44 kg/m., no fatigue, no subjective hyperthermia, no subjective hypothermia Eyes: no blurry vision, no xerophthalmia ENT: no sore throat, no nodules palpated in throat, no dysphagia/odynophagia, no hoarseness Cardiovascular: no chest pain, no shortness of breath, no palpitations, no leg swelling Respiratory: no cough, no shortness of breath Gastrointestinal: no nausea/vomiting/diarrhea Musculoskeletal: no muscle/joint aches Skin: no rashes, no hyperemia Neurological: no tremors, + numbness/tingling to BLE and bilateral hands, no dizziness Psychiatric: no depression, no anxiety  Objective:     BP (!) 150/78 (BP Location: Left Arm, Patient Position: Sitting, Cuff Size: Large) Comment: Retake - manuel cuff  Pulse 61   Ht '5\' 6"'$  (1.676 m)   Wt 201 lb (91.2 kg)   BMI 32.44 kg/m   Wt Readings from Last 3 Encounters:  03/07/23 201 lb (91.2 kg)  02/28/23 202 lb (91.6 kg)  01/02/23 201 lb 0.6 oz (91.2 kg)     BP Readings from Last 3 Encounters:  03/07/23 (!) 150/78  02/28/23 (!) 152/79  01/02/23 120/71      Physical Exam- Limited  Constitutional:  Body mass index is 32.44 kg/m. , not in acute distress, normal state of mind Eyes:  EOMI, no exophthalmos Musculoskeletal: no gross deformities, strength intact in all four extremities, no gross restriction of joint movements Skin:  no rashes, no hyperemia Neurological: no tremor with outstretched hands    CMP ( most recent) CMP     Component Value Date/Time   NA 139 02/22/2023 1341   K 4.5 02/22/2023 1341   CL 100 02/22/2023 1341   CO2 23 02/22/2023 1341   GLUCOSE 115 (H) 02/22/2023 1341   GLUCOSE 102 (H) 04/25/2021 1343   BUN 14  02/22/2023 1341   CREATININE 0.67 02/22/2023 1341   CALCIUM 9.6 02/22/2023 1341   PROT 6.7 02/22/2023 1341   ALBUMIN 4.2 02/22/2023  1341   AST 36 02/22/2023 1341   ALT 33 (H) 02/22/2023 1341   ALKPHOS 41 (L) 02/22/2023 1341   BILITOT 0.5 02/22/2023 1341   GFRNONAA >60 04/25/2021 1343   GFRAA >60 09/12/2019 0239     Diabetic Labs (most recent): Lab Results  Component Value Date   HGBA1C 8.5 (H) 02/22/2023   HGBA1C 8.8 (A) 11/29/2022   HGBA1C 9.3 (H) 08/22/2022     Lipid Panel ( most recent) Lipid Panel     Component Value Date/Time   CHOL 140 02/22/2023 1341   TRIG 107 02/22/2023 1341   HDL 52 02/22/2023 1341   CHOLHDL 2.7 02/22/2023 1341   LDLCALC 68 02/22/2023 1341   LABVLDL 20 02/22/2023 1341      Lab Results  Component Value Date   TSH 0.648 02/22/2023   TSH 0.317 (L) 08/22/2022   TSH 0.403 (L) 05/22/2022   FREET4 1.25 02/22/2023   FREET4 1.46 08/22/2022   FREET4 1.52 05/22/2022           Assessment & Plan:   1) Type 2 diabetes mellitus without complication, with long-term current use of insulin ([place])  She presents today with her logs, no meter and CGM (requesting assistance starting new sensor) showing above target glycemic profile overall.  Her most recent A1c, checked by her PCP on 3/1, was 8.5%, improving slightly from last visit of 8.8%.  She says she has had trouble getting her sensors to stay.  She denies any hypoglycemia.  She has tolerated the addition of Rybelsus well, PCP increased her dose to 14 mg which she has not yet picked up, but she has been taking 2 of her 7 mg tabs in the interim.  - [name] has currently uncontrolled symptomatic type 2 DM since 72 years of age.   -Recent labs reviewed.  - I had a long discussion with her about the progressive nature of diabetes and the pathology behind its complications. -her diabetes is not currently complicated but she remains at a high risk for more acute and chronic complications which include CAD, CVA, CKD, retinopathy, and neuropathy. These are all discussed in detail with her.  The following Lifestyle Medicine  recommendations according to [facility]) were discussed and offered to patient and she agrees to start the journey:  A. Whole Foods, Plant-based plate comprising of fruits and vegetables, plant-based proteins, whole-grain carbohydrates was discussed in detail with the patient.   A list for source of those nutrients were also provided to the patient.  Patient will use only water or unsweetened tea for hydration. B.  The need to stay away from risky substances including alcohol, smoking; obtaining 7 to 9 hours of restorative sleep, at least 150 minutes of moderate intensity exercise weekly, the importance of healthy social connections,  and stress reduction techniques were discussed. C.  A full color page of  Calorie density of various food groups per pound showing examples of each food groups was provided to the patient.  - Nutritional counseling repeated at each appointment due to patients tendency to fall back in to old habits.  - The patient admits there is a room for improvement in their diet and drink choices. -  Suggestion is made for the patient to avoid simple carbohydrates from their diet including Cakes, Sweet Desserts / Pastries,  Ice Cream, Soda (diet and regular), Sweet Tea, Candies, Chips, Cookies, Sweet Pastries, Store Bought Juices, Alcohol in Excess of 1-2 drinks a day, Artificial Sweeteners, Coffee Creamer, and "Sugar-free" Products. This will help patient to have stable blood glucose profile and potentially avoid unintended weight gain.   - I encouraged the patient to switch to unprocessed or minimally processed complex starch and increased protein intake (animal or plant source), fruits, and vegetables.   - Patient is advised to stick to a routine mealtimes to eat 3 meals a day and avoid unnecessary snacks (to snack only to correct hypoglycemia).  - I have approached her with the following individualized plan to manage her diabetes and patient agrees:    -She is advised to continue Lantus 40 units SQ nightly and continue her Humalog 8-14 units TID with meals if glucose is above 90 and she is eating (Specific instructions on how to titrate insulin dosage based on glucose readings given to patient in writing).  She can also continue Rybelsus 14 mg po daily before breakfast.   -she is encouraged to continue monitoring glucose 4 times daily (using her CGM), before meals and before bed, and to call the clinic if she has readings less than 70 or above 300 for 3 tests in a row.  Sample CGM given today and assisted with application in the room with SKIN TAC to help stay on.  - she is warned not to take insulin without proper monitoring per orders. - Adjustment parameters are given to her for hypo and hyperglycemia in writing.  - she is not a candidate for sulfa meds due to allergy.  - Specific targets for  A1c; LDL, HDL, and Triglycerides were discussed with the patient.  2) Blood Pressure /Hypertension:  her blood pressure is controlled to target without the use of antihypertensive medications.  3) Lipids/Hyperlipidemia:    Review of her recent lipid panel from 02/22/23 showed controlled LDL at 68.  she is advised to continue Lipitor 20 mg daily at bedtime.  Side effects and precautions discussed with her.  4)  Weight/Diet:  her Body mass index is 32.44 kg/m.  -  clearly complicating her diabetes care.   she is a candidate for weight loss. I discussed with her the fact that loss of 5 - 10% of her  current body weight will have the most impact on her diabetes management.  Exercise, and detailed carbohydrates information provided  -  detailed on discharge instructions.  5) Hypothyroidism-unspecified Her previsit thyroid function tests are consistent with appropriate hormone replacement.  She is advised to continue Levothyroxine 112 mcg po daily before breakfast.     - The correct intake of thyroid hormone (Levothyroxine, Synthroid), is on empty  stomach first thing in the morning, with water, separated by at least 30 minutes from breakfast and other medications,  and separated by more than 4 hours from calcium, iron, multivitamins, acid reflux medications (PPIs).  - This medication is a life-long medication and will be needed to correct thyroid hormone imbalances for the rest of your life.  The dose may change from time to time, based on thyroid blood work.  - It is extremely important to be consistent taking this medication, near the same time each morning.  -AVOID TAKING PRODUCTS CONTAINING BIOTIN (commonly found in Hair, Skin, Nails vitamins) AS IT INTERFERES WITH THE VALIDITY OF THYROID FUNCTION BLOOD TESTS.  6) Vitamin D deficiency Her recent vitamin D level was low at 21.4.  Her PCP put  her on Ergocalciferol 50000 units weekly.  7) Chronic Care/Health Maintenance: -she is not on ACEI/ARB and is on Statin medications and is encouraged to initiate and continue to follow up with Ophthalmology, Dentist, Podiatrist at least yearly or according to recommendations, and advised to stay away from smoking. I have recommended yearly flu vaccine and pneumonia vaccine at least every 5 years; moderate intensity exercise for up to 150 minutes weekly; and sleep for at least 7 hours a day.  - she is advised to maintain close follow up with Angelica Monday, FNP for primary care needs, as well as her other providers for optimal and coordinated care.     I spent  48  minutes in the care of the patient today including review of labs from [place], Lipids, Thyroid Function, Hematology (current and previous including abstractions from other facilities); face-to-face time discussing  her blood glucose readings/logs, discussing hypoglycemia and hyperglycemia episodes and symptoms, medications doses, her options of short and long term treatment based on the latest standards of care / guidelines;  discussion about incorporating lifestyle medicine;  and documenting  the encounter. Risk reduction counseling performed per USPSTF guidelines to reduce obesity and cardiovascular risk factors.     Please refer to Patient Instructions for Blood Glucose Monitoring and Insulin/Medications Dosing Guide"  in media tab for additional information. Please  also refer to " Patient Self Inventory" in the Media  tab for reviewed elements of pertinent patient history.  [name] participated in the discussions, expressed understanding, and voiced agreement with the above plans.  All questions were answered to her satisfaction. she is encouraged to contact clinic should she have any questions or concerns prior to her return visit.     Follow up plan: - Return in about 3 months (around 06/07/2023) for Diabetes F/U with A1c in office, No previsit labs, Bring meter and logs.   [name], [facility] [address] [place], [place] [address] Phone: [phone] Fax: [phone]  03/07/2023, 1:45 PM

## 2023-03-12 ENCOUNTER — Telehealth: Payer: Self-pay | Admitting: Family Medicine

## 2023-03-12 NOTE — Telephone Encounter (Signed)
Pt wants to know if something can be called in for diarrhea that's stronger then imodium?   [facility]

## 2023-03-13 NOTE — Telephone Encounter (Signed)
Please schedule an office visit to further evaluate your symptoms

## 2023-03-22 ENCOUNTER — Ambulatory Visit (INDEPENDENT_AMBULATORY_CARE_PROVIDER_SITE_OTHER): Payer: 59 | Admitting: Internal Medicine

## 2023-03-22 ENCOUNTER — Encounter: Payer: Self-pay | Admitting: Internal Medicine

## 2023-03-22 ENCOUNTER — Other Ambulatory Visit: Payer: Self-pay | Admitting: Family Medicine

## 2023-03-22 VITALS — BP 118/63 | HR 69 | Ht 66.5 in | Wt 193.4 lb

## 2023-03-22 DIAGNOSIS — B379 Candidiasis, unspecified: Secondary | ICD-10-CM

## 2023-03-22 DIAGNOSIS — R110 Nausea: Secondary | ICD-10-CM

## 2023-03-22 DIAGNOSIS — K2970 Gastritis, unspecified, without bleeding: Secondary | ICD-10-CM | POA: Insufficient documentation

## 2023-03-22 DIAGNOSIS — A049 Bacterial intestinal infection, unspecified: Secondary | ICD-10-CM | POA: Diagnosis not present

## 2023-03-22 MED ORDER — CIPROFLOXACIN HCL 500 MG PO TABS: 500.0000 mg | ORAL_TABLET | Freq: Two times a day (BID) | ORAL | 0 refills | Status: AC

## 2023-03-22 MED ORDER — ONDANSETRON HCL 4 MG PO TABS: 4.0000 mg | ORAL_TABLET | Freq: Four times a day (QID) | ORAL | 2 refills | Status: DC | PRN

## 2023-03-22 NOTE — Assessment & Plan Note (Signed)
Presents today for an acute visit endorsing the symptoms noted above.  Her husband had similar symptoms.  He was recently treated with Cipro and has significantly improved.  Stool studies are pending. [name] is not appreciated any blood in her stool.  She has taken any antibiotics recently.  For bacterial GE. -Cipro 500 mg twice daily x 3 days prescribed today -Zofran refilled for nausea relief -She was instructed to maintain aggressive hydration given the large volume loss with episodes of diarrhea -BRAT diet recommended -Stool studies ordered today -Given that we are heading into the weekend, [name] was instructed to present to the emergency department begins to feel worse or her symptoms do not improve with the treatment measures noted above

## 2023-03-22 NOTE — Progress Notes (Signed)
   Acute Office Visit  Subjective:     Patient ID: [name], female    DOB: [birth year], 72 y.o.   MRN: [id number]  Chief Complaint  Patient presents with   Diarrhea    Diarrhea and vomiting since 03/18/23. Husband has same symptoms and thinks she got what he had.     [name] presents today for an acute visit endorsing a 4-day history of nausea and vomiting with diarrhea.  Her husband has had similar symptoms.  She is concerned that she has developed an infection from him.  She describes 3-4 episodes of voluminous watery diarrhea daily.  She has not appreciated any blood in her stool.  She endorses multiple daily episodes of nausea with vomiting.  Emesis is nonbloody and nonbilious.  She has been taking Imodium for symptom relief.  She has considered presenting to the emergency department for evaluation because of her symptoms. [name] reports feeling weak.  She denies fever/chills or further associated symptoms.  Review of Systems  Constitutional:  Negative for chills and fever.  Gastrointestinal:  Positive for abdominal pain, diarrhea, nausea and vomiting. Negative for blood in stool.  All other systems reviewed and are negative.     Objective:    BP 118/63   Pulse 69   Ht 5' 6.5" (1.689 m)   Wt 193 lb 6.4 oz (87.7 kg)   SpO2 98%   BMI 30.75 kg/m  BP Readings from Last 3 Encounters:  03/22/23 118/63  03/07/23 (!) 150/78  02/28/23 (!) 152/79   Physical Exam Vitals reviewed.  Constitutional:      Appearance: She is obese. She is ill-appearing.     Comments: Moist mucous membranes  Abdominal:     General: Bowel sounds are normal.     Tenderness: There is abdominal tenderness (Vague, generalized abdominal).  Skin:    General: Skin is warm and dry.  Neurological:     Mental Status: She is alert.       Assessment & Plan:   Problem List Items Addressed This Visit       Bacterial gastroenteritis - Primary    Presents today for an acute visit endorsing  the symptoms noted above.  Her husband had similar symptoms.  He was recently treated with Cipro and has significantly improved.  Stool studies are pending. [name] is not appreciated any blood in her stool.  She has taken any antibiotics recently.  For bacterial GE. -Cipro 500 mg twice daily x 3 days prescribed today -Zofran refilled for nausea relief -She was instructed to maintain aggressive hydration given the large volume loss with episodes of diarrhea -BRAT diet recommended -Stool studies ordered today -Given that we are heading into the weekend, [name] was instructed to present to the emergency department begins to feel worse or her symptoms do not improve with the treatment measures noted above      Meds ordered this encounter  Medications   ondansetron (ZOFRAN) 4 MG tablet    Sig: Take 1 tablet (4 mg total) by mouth every 6 (six) hours as needed.    Dispense:  30 tablet    Refill:  2   ciprofloxacin (CIPRO) 500 MG tablet    Sig: Take 1 tablet (500 mg total) by mouth 2 (two) times daily for 3 days.    Dispense:  6 tablet    Refill:  0    Return if symptoms worsen or fail to improve.  Johnette Abraham, MD

## 2023-03-22 NOTE — Patient Instructions (Signed)
It was a pleasure to see you today.  Thank you for giving [place] the opportunity to be involved in your care.  Below is a brief recap of your visit and next steps.  We will plan to see you again in April.  Summary Cipro 500 mg twice daily x 3 days prescribed Please be sure to drink plenty of fluids Zofran refilled for nausea relief Please present to the emergency department if you are unable to keep food down and continue to feel weak

## 2023-04-02 ENCOUNTER — Other Ambulatory Visit: Payer: Self-pay

## 2023-04-02 MED ORDER — DEXCOM G7 SENSOR MISC: 0 refills | Status: DC

## 2023-04-05 ENCOUNTER — Ambulatory Visit (INDEPENDENT_AMBULATORY_CARE_PROVIDER_SITE_OTHER): Payer: 59 | Admitting: Family Medicine

## 2023-04-05 ENCOUNTER — Encounter: Payer: Self-pay | Admitting: Family Medicine

## 2023-04-05 VITALS — BP 118/70 | HR 74 | Ht 66.5 in | Wt 194.0 lb

## 2023-04-05 DIAGNOSIS — K641 Second degree hemorrhoids: Secondary | ICD-10-CM | POA: Diagnosis not present

## 2023-04-05 DIAGNOSIS — E1100 Type 2 diabetes mellitus with hyperosmolarity without nonketotic hyperglycemic-hyperosmolar coma (NKHHC): Secondary | ICD-10-CM | POA: Diagnosis not present

## 2023-04-05 DIAGNOSIS — K5641 Fecal impaction: Secondary | ICD-10-CM | POA: Diagnosis not present

## 2023-04-05 DIAGNOSIS — F419 Anxiety disorder, unspecified: Secondary | ICD-10-CM | POA: Diagnosis not present

## 2023-04-05 DIAGNOSIS — E038 Other specified hypothyroidism: Secondary | ICD-10-CM

## 2023-04-05 DIAGNOSIS — B379 Candidiasis, unspecified: Secondary | ICD-10-CM

## 2023-04-05 DIAGNOSIS — Z794 Long term (current) use of insulin: Secondary | ICD-10-CM

## 2023-04-05 DIAGNOSIS — K5900 Constipation, unspecified: Secondary | ICD-10-CM

## 2023-04-05 MED ORDER — RYBELSUS 14 MG PO TABS: 14.0000 mg | ORAL_TABLET | Freq: Every day | ORAL | 2 refills | Status: DC

## 2023-04-05 MED ORDER — NYSTATIN 100000 UNIT/GM EX CREA: TOPICAL_CREAM | CUTANEOUS | 1 refills | Status: DC

## 2023-04-05 MED ORDER — SENNOSIDES 8.6 MG PO TABS: 1.0000 | ORAL_TABLET | Freq: Every day | ORAL | 1 refills | Status: DC

## 2023-04-05 MED ORDER — HYDROCORTISONE (PERIANAL) 2.5 % EX CREA: 1.0000 | TOPICAL_CREAM | Freq: Two times a day (BID) | CUTANEOUS | 0 refills | Status: DC

## 2023-04-05 NOTE — Assessment & Plan Note (Signed)
Refilled Rybelsus 14 mg daily Encouraged to continue taking Lantus 40 units nightly and Humalog 8-14 units 3 times daily Encouraged to follow-up with endocrinology as scheduled No reports of plan urea, polyphagia, by dyspnea Lab Results  Component Value Date   HGBA1C 8.5 (H) 02/22/2023

## 2023-04-05 NOTE — Assessment & Plan Note (Signed)
No reports of suicidal ideation or thoughts Encouraged to continue taking Prozac 20 mg daily

## 2023-04-05 NOTE — Assessment & Plan Note (Addendum)
Encouraged to take over-the-counter minimal also fiber for constipation Encouraged to take 2.5 to 30 g/day in divided doses Encouraged to increase her fluid consumption at least 64 ounces daily and Encouraged to start taking Senokot 8.6mg  daily Refill nystatin cream to apply to the gluteal fold Will treat external hemorrhoids with hydrocortisone 2.5% rectal cream

## 2023-04-05 NOTE — Progress Notes (Signed)
Established Patient Office Visit  Subjective:  Patient ID: [name], female    DOB: June 05, 1951  Age: 72 y.o. MRN: [id number]  CC:  Chief Complaint  Patient presents with   Follow-up    3 month f/u, pt reports sx of consitpation on and off for the past few months states she will be consipated for 3-4 days then she finally goes it will be watery and blood.     HPI LEODA SMITHHART is a 72 y.o. female with past medical history of type 2 diabetes, hypothyroidism, and anxiety presents for f/u of chronic medical conditions.  For the details of today's visit, please refer to the assessment and plan.    Fecal impaction:History of constipation. Reports going 3 to 4 days without a bowel movement.  When she does have a bowel movement, she complains of abdominal cramping and bloating, leakage of liquid, or episodes of watery diarrhea.  She also reports straining when having a bowel movement watery stools.  She notes blood in her stool from external hemorrhoids.  No systematic symptoms were reported.  She had a negative Cologuard screening on 06/29/2022.    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   Procedure: TOTAL KNEE ARTHROPLASTY;  Surgeon: Frederico Hamman, MD;  Location: [facility];  Service: Orthopedics;  Laterality: Right;    Family History  Problem Relation Age of Onset   Cancer Mother    Hypertension  Mother    Thyroid disease Mother    Diabetes Mother    Stroke Mother    Heart failure Mother    Diabetes Father    Heart attack Father    Diabetes Sister    Diabetes Brother     Social History   Socioeconomic History   Marital status: Married    Spouse name: Not on file   Number of children: Not on file   Years of education: Not on file   Highest education level: Not on file  Occupational History   Not on file  Tobacco Use   Smoking status: Former    Packs/day: 0.50    Years: 15.00    Additional pack years: 0.00    Total pack years: 7.50    Types: Cigarettes    Quit date: 2013    Years since quitting: 11.2   Smokeless tobacco: Never  Vaping Use   Vaping Use: Never used  Substance and Sexual Activity   Alcohol use: Never   Drug use: Never   Sexual activity: Not on file  Other Topics Concern   Not on file  Social History Narrative   Not on file   Social Determinants of Health   Financial Resource Strain: Not on file  Food Insecurity: Not on file  Transportation Needs: Not on file  Physical Activity: Not on file  Stress: Not on file  Social Connections: Not on file  Intimate Partner Violence: Not on file    Outpatient Medications Prior to Visit  Medication Sig Dispense Refill   acyclovir (ZOVIRAX) 400 MG tablet TAKE 1 TABLET BY MOUTH EVERY TWELVE HOURS (AT BREAKFAST AND AT BEDTIME) 90 tablet 1   alendronate (FOSAMAX) 70 MG tablet Take 1 tablet (70 mg total) by mouth every 7 (seven) days. Take with a full glass of water on an empty stomach. 4 tablet 11   Apple Cider Vinegar 300 MG TABS Take by mouth.     aspirin 81 MG chewable tablet Chew 1 tablet (81 mg total) by mouth 2 (two) times daily. 60 tablet 0   atorvastatin (LIPITOR) 20 MG tablet TAKE 1 TABLET BY MOUTH AT BEDTIME 90 tablet 1   Blood Pressure Monitoring (BLOOD PRESSURE MONITOR/L CUFF) MISC Dx: R03.0, elevated bp in office 1 each 0   cholecalciferol (VITAMIN D3) 25 MCG (1000 UNIT) tablet Take 1,000  Units by mouth daily.     clonazePAM (KLONOPIN) 0.5 MG tablet TAKE 1 TABLET BY MOUTH AT BEDTIME 30 tablet 1   Continuous Blood Gluc Sensor (DEXCOM G7 SENSOR) MISC Use to test BG 4+ times daily. Change sensor every 10 days. E11.65 6 each 0   donepezil (ARICEPT) 10 MG tablet TAKE 1 TABLET BY MOUTH EVERY DAY 90 tablet 1   EPINEPHrine 0.3 mg/0.3 mL IJ SOAJ injection Inject 0.3 mg into the muscle as needed for anaphylaxis. 1 each 2   escitalopram (LEXAPRO) 20 MG tablet TAKE 1 TABLET BY MOUTH EVERY DAY 30 tablet 2   glucose blood (TRUE METRIX BLOOD GLUCOSE TEST) test strip USE AS DIRECTED 100 each 2   insulin glargine (LANTUS) 100 UNIT/ML Solostar Pen Inject 40 Units into the skin at bedtime. 45 mL 0   insulin lispro (HUMALOG) 100 UNIT/ML KwikPen Inject 8-14 Units into the skin 3 (three) times daily. 36 mL 3   Insulin Pen Needle (BD AUTOSHIELD DUO) 30G X 5 MM MISC Use 4 times daily as instructed 100 each 2   levothyroxine (SYNTHROID) 112 MCG tablet TAKE 1 TABLET BY MOUTH DAILY 90 tablet 1   loperamide (IMODIUM) 2 MG capsule Take 2 mg by mouth See admin instructions. Take 2 mg PO after each loose stool as needed for diarrhea (max of 3 doses in 24 hours)     methocarbamol (ROBAXIN) 500 MG tablet Take 1 tablet (500 mg total) by mouth 2 (two) times daily. 30 tablet 0   metoCLOPramide (REGLAN) 5 MG tablet TAKE 1 TABLET BY MOUTH FOUR TIMES DAILY 30 tablet 0   ondansetron (ZOFRAN) 4 MG tablet Take 1 tablet (4 mg total) by mouth every 6 (six) hours as needed. 30 tablet 2   polyethylene glycol powder (GLYCOLAX/MIRALAX) 17 GM/SCOOP powder SMARTSIG:1 Capful(s) By Mouth Daily     prazosin (MINIPRESS) 2 MG capsule TAKE ONE CAPSULE BY MOUTH AT BEDTIME 90 capsule 1   predniSONE (DELTASONE) 20 MG tablet Take 1 tablet (20 mg total) by mouth 2 (two) times daily with a meal. 10 tablet 0   Specialty Vitamins Products (COLLAGEN ULTRA PO) Take by mouth.     Tetrahydrozoline-Zn Sulfate (EYE DROPS AR OP) Apply to eye.      traZODone (DESYREL) 50 MG tablet TAKE 2 TABLETS BY MOUTH AT BEDTIME 30 tablet 2   trospium (SANCTURA) 20 MG tablet Take 1 tablet (20 mg total) by mouth 2 (two) times daily. 60 tablet  11   Vibegron (GEMTESA) 75 MG TABS Take 75 mg by mouth daily. 28 tablet 0   vitamin B-12 (CYANOCOBALAMIN) 1000 MCG tablet Take 1,000 mcg by mouth daily.     Vitamin D, Ergocalciferol, (DRISDOL) 1.25 MG (50000 UNIT) CAPS capsule Take 1 capsule (50,000 Units total) by mouth every 7 (seven) days. 10 capsule 1   nystatin cream (MYCOSTATIN) APPLY TO SKIN FOLDS, UNDER BREASTS, ABDOMINAL FOLDS AND INGUINAL FOLDS TWICE DAILY FOR CANDIDASIS 30 g 1   Semaglutide (RYBELSUS) 14 MG TABS Take 1 tablet (14 mg total) by mouth daily. 30 tablet 2   No facility-administered medications prior to visit.    Allergies  Allergen Reactions   Ambien [Zolpidem Tartrate] Other (See Comments)    Hallucinations/nightmares   Bee Venom Swelling   Coconut Flavor Swelling    Mouth swells   Codeine Nausea And Vomiting   Onion Nausea Only and Other (See Comments)    Raw onions   Other     Ground Beef (patient avoid due to side effect of diarrhea)   Sulfa Antibiotics Rash and Other (See Comments)    Including topical sulfa  (mouth dries/peels/skin blisters)    ROS Review of Systems  Constitutional:  Negative for chills and fever.  Eyes:  Negative for visual disturbance.  Respiratory:  Negative for chest tightness and shortness of breath.   Gastrointestinal:  Positive for abdominal pain and constipation.  Neurological:  Negative for dizziness and headaches.      Objective:    Physical Exam HENT:     Head: Normocephalic.     Mouth/Throat:     Mouth: Mucous membranes are moist.  Cardiovascular:     Rate and Rhythm: Normal rate.     Heart sounds: Normal heart sounds.  Pulmonary:     Effort: Pulmonary effort is normal.     Breath sounds: Normal breath sounds.  Genitourinary:    Rectum: External hemorrhoid present.  Skin:     Findings: Rash (gluteal sulcus) present.  Neurological:     Mental Status: She is alert.     BP 118/70   Pulse 74   Ht 5' 6.5" (1.689 m)   Wt 194 lb (88 kg)   SpO2 98%   BMI 30.84 kg/m  Wt Readings from Last 3 Encounters:  04/05/23 194 lb (88 kg)  03/22/23 193 lb 6.4 oz (87.7 kg)  03/07/23 201 lb (91.2 kg)    Lab Results  Component Value Date   TSH 0.648 02/22/2023   Lab Results  Component Value Date   WBC 6.1 02/22/2023   HGB 12.2 02/22/2023   HCT 37.6 02/22/2023   MCV 85 02/22/2023   PLT 248 02/22/2023   Lab Results  Component Value Date   NA 139 02/22/2023   K 4.5 02/22/2023   CO2 23 02/22/2023   GLUCOSE 115 (H) 02/22/2023   BUN 14 02/22/2023   CREATININE 0.67 02/22/2023   BILITOT 0.5 02/22/2023   ALKPHOS 41 (L) 02/22/2023   AST 36 02/22/2023   ALT 33 (H) 02/22/2023   PROT 6.7 02/22/2023   ALBUMIN 4.2 02/22/2023   CALCIUM 9.6 02/22/2023   ANIONGAP 8 04/25/2021   EGFR 92 02/22/2023   Lab Results  Component Value Date   CHOL 140 02/22/2023   Lab Results  Component Value Date   HDL 52 02/22/2023   Lab Results  Component Value Date   LDLCALC 68 02/22/2023   Lab Results  Component Value Date   TRIG 107 02/22/2023  Lab Results  Component Value Date   CHOLHDL 2.7 02/22/2023   Lab Results  Component Value Date   HGBA1C 8.5 (H) 02/22/2023      Assessment & Plan:  Fecal impaction Assessment & Plan: Encouraged to take over-the-counter minimal also fiber for constipation Encouraged to take 2.5 to 30 g/day in divided doses Encouraged to increase her fluid consumption at least 64 ounces daily and Encouraged to start taking Senokot 8.6mg  daily Refill nystatin cream to apply to the gluteal fold Will treat external hemorrhoids with hydrocortisone 2.5% rectal cream   Constipation in female -     Sennosides; Take 1 tablet (8.6 mg total) by mouth daily.  Dispense: 60 tablet; Refill: 1  Grade II hemorrhoids -     Hydrocortisone (Perianal);  Place 1 Application rectally 2 (two) times daily.  Dispense: 30 g; Refill: 0  Type 2 diabetes mellitus with hyperosmolarity without coma, with long-term current use of insulin Assessment & Plan: Refilled Rybelsus 14 mg daily Encouraged to continue taking Lantus 40 units nightly and Humalog 8-14 units 3 times daily Encouraged to follow-up with endocrinology as scheduled No reports of plan urea, polyphagia, by dyspnea Lab Results  Component Value Date   HGBA1C 8.5 (H) 02/22/2023     Orders: -     Rybelsus; Take 1 tablet (14 mg total) by mouth daily.  Dispense: 90 tablet; Refill: 2  Anxiety Assessment & Plan: No reports of suicidal ideation or thoughts Encouraged to continue taking Prozac 20 mg daily   Candida infection -     Nystatin; APPLY TO SKIN FOLDS, UNDER BREASTS, ABDOMINAL FOLDS AND INGUINAL FOLDS TWICE DAILY FOR CANDIDASIS  Dispense: 30 g; Refill: 1  Other specified hypothyroidism Assessment & Plan: Encouraged to continue taking synthroid 112 mcg Pending labs Lab Results  Component Value Date   TSH 0.648 02/22/2023        Follow-up: Return in about 3 months (around 07/05/2023).   Gilmore Laroche, FNP

## 2023-04-05 NOTE — Patient Instructions (Addendum)
I appreciate the opportunity to provide care to you today!    Follow up:  3 months    Hemorrhoids Please stat applying hydrocortisone 2.5% cream twice daily to the affected site  Constipation  Please start taking over-the-counter minimal stool fiber for constipation 2.5 to 30 g/day in divided doses A prescription for Senokot, stool softener sent to your pharmacy to start taking   Please continue to a heart-healthy diet and increase your physical activities. Try to exercise for at least five days a week.      It was a pleasure to see you and I look forward to continuing to work together on your health and well-being. Please do not hesitate to call the office if you need care or have questions about your care.   Have a wonderful day and week. With Gratitude, Gilmore Laroche MSN, FNP-BC

## 2023-04-05 NOTE — Assessment & Plan Note (Signed)
Encouraged to continue taking synthroid 112 mcg Pending labs Lab Results  Component Value Date   TSH 0.648 02/22/2023

## 2023-04-10 ENCOUNTER — Telehealth: Payer: Self-pay | Admitting: Nurse Practitioner

## 2023-04-10 MED ORDER — DEXCOM G7 SENSOR MISC: 0 refills | Status: DC

## 2023-04-10 NOTE — Telephone Encounter (Signed)
Sensors resent to pharmacy

## 2023-04-10 NOTE — Telephone Encounter (Signed)
Pt said that [facility] contacted her to let her know we still have not sent in her sensors. I told her they were sent on 4/9 can you follow up? Maybe re send? I havent seen a PA on this

## 2023-04-11 ENCOUNTER — Telehealth: Payer: Self-pay | Admitting: *Deleted

## 2023-04-11 ENCOUNTER — Other Ambulatory Visit (HOSPITAL_COMMUNITY): Payer: Self-pay

## 2023-04-11 ENCOUNTER — Other Ambulatory Visit: Payer: Self-pay | Admitting: *Deleted

## 2023-04-11 DIAGNOSIS — E119 Type 2 diabetes mellitus without complications: Secondary | ICD-10-CM

## 2023-04-11 MED ORDER — DEXCOM G7 SENSOR MISC: 4 refills | Status: DC

## 2023-04-11 NOTE — Telephone Encounter (Signed)
Patient has called back today left a message that she has not heard back from her pharmacy about her sensors. A prescription was sent in on yesterday to [facility]. Our office has not seen a PA on this.  Patient was called and made aware that we would send over to the PA team a request for a status, if they have received it.  If the patient cannot get she is asking that a meter /test strips al that she would need be called in for her so that she can keep up with her blood sugars.

## 2023-04-11 NOTE — Telephone Encounter (Signed)
I have called the patient and made her aware that a new prescription had been sent in to [facility] per the request of the PA team. She will let us know what she finds out.

## 2023-05-08 ENCOUNTER — Other Ambulatory Visit: Payer: Self-pay | Admitting: Family Medicine

## 2023-05-08 DIAGNOSIS — B009 Herpesviral infection, unspecified: Secondary | ICD-10-CM

## 2023-05-08 DIAGNOSIS — F419 Anxiety disorder, unspecified: Secondary | ICD-10-CM

## 2023-05-08 DIAGNOSIS — E039 Hypothyroidism, unspecified: Secondary | ICD-10-CM

## 2023-05-08 DIAGNOSIS — E7849 Other hyperlipidemia: Secondary | ICD-10-CM

## 2023-05-08 DIAGNOSIS — F515 Nightmare disorder: Secondary | ICD-10-CM

## 2023-05-08 DIAGNOSIS — F03A4 Unspecified dementia, mild, with anxiety: Secondary | ICD-10-CM

## 2023-05-21 ENCOUNTER — Other Ambulatory Visit: Payer: Self-pay | Admitting: Family Medicine

## 2023-05-21 DIAGNOSIS — B379 Candidiasis, unspecified: Secondary | ICD-10-CM

## 2023-05-22 ENCOUNTER — Ambulatory Visit: Payer: 59

## 2023-05-22 LAB — HM DIABETES EYE EXAM

## 2023-06-10 ENCOUNTER — Other Ambulatory Visit: Payer: Self-pay | Admitting: Family Medicine

## 2023-06-10 DIAGNOSIS — E559 Vitamin D deficiency, unspecified: Secondary | ICD-10-CM

## 2023-06-17 ENCOUNTER — Encounter: Payer: Self-pay | Admitting: Nurse Practitioner

## 2023-06-17 ENCOUNTER — Other Ambulatory Visit (HOSPITAL_COMMUNITY): Payer: Self-pay

## 2023-06-17 ENCOUNTER — Ambulatory Visit (INDEPENDENT_AMBULATORY_CARE_PROVIDER_SITE_OTHER): Payer: 59 | Admitting: Nurse Practitioner

## 2023-06-17 VITALS — BP 105/66 | HR 78 | Ht 66.5 in | Wt 185.4 lb

## 2023-06-17 DIAGNOSIS — Z794 Long term (current) use of insulin: Secondary | ICD-10-CM | POA: Diagnosis not present

## 2023-06-17 DIAGNOSIS — E039 Hypothyroidism, unspecified: Secondary | ICD-10-CM | POA: Diagnosis not present

## 2023-06-17 DIAGNOSIS — E559 Vitamin D deficiency, unspecified: Secondary | ICD-10-CM

## 2023-06-17 DIAGNOSIS — E119 Type 2 diabetes mellitus without complications: Secondary | ICD-10-CM

## 2023-06-17 DIAGNOSIS — Z7984 Long term (current) use of oral hypoglycemic drugs: Secondary | ICD-10-CM

## 2023-06-17 DIAGNOSIS — E1100 Type 2 diabetes mellitus with hyperosmolarity without nonketotic hyperglycemic-hyperosmolar coma (NKHHC): Secondary | ICD-10-CM

## 2023-06-17 LAB — POCT GLYCOSYLATED HEMOGLOBIN (HGB A1C): Hemoglobin A1C: 6.2 % — AB (ref 4.0–5.6)

## 2023-06-17 MED ORDER — INSULIN LISPRO (1 UNIT DIAL) 100 UNIT/ML (KWIKPEN): 8.0000 [IU] | PEN_INJECTOR | Freq: Three times a day (TID) | SUBCUTANEOUS | 3 refills | Status: DC

## 2023-06-17 MED ORDER — INSULIN GLARGINE 100 UNIT/ML SOLOSTAR PEN: 40.0000 [IU] | PEN_INJECTOR | Freq: Every day | SUBCUTANEOUS | 3 refills | Status: DC

## 2023-06-17 MED ORDER — RYBELSUS 14 MG PO TABS: 14.0000 mg | ORAL_TABLET | Freq: Every day | ORAL | 3 refills | Status: DC

## 2023-06-17 NOTE — Progress Notes (Signed)
Endocrinology Follow Up Note       06/17/2023, 9:26 AM   Subjective:    Patient ID: [name], female    DOB: 23-Feb-1951.  [name] is being seen in follow up after being seen in consultation for management of currently uncontrolled symptomatic diabetes requested by  Gilmore Laroche, FNP.   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   Procedure: TOTAL KNEE ARTHROPLASTY;  Surgeon: Frederico Hamman, MD;  Location: [facility];  Service: Orthopedics;  Laterality: Right;    Social History   Socioeconomic History   Marital status: Married    Spouse name: Not on file   Number of children: Not on file   Years of education: Not on file   Highest education level: Not on file  Occupational History   Not on file  Tobacco Use   Smoking status: Former    Packs/day: 0.50    Years: 15.00    Additional pack years: 0.00    Total pack years: 7.50    Types: Cigarettes    Quit date: 2013    Years since quitting: 11.4   Smokeless tobacco: Never  Vaping Use   Vaping Use: Never used  Substance and Sexual Activity   Alcohol use: Never   Drug use: Never   Sexual activity: Not on file  Other Topics Concern   Not on file  Social History Narrative   Not on file   Social Determinants of Health   Financial  Resource Strain: Not on file  Food Insecurity: Not on file  Transportation Needs: Not on file  Physical Activity: Not on file  Stress: Not on file  Social Connections: Not on file    Family History  Problem Relation Age of Onset   Cancer Mother    Hypertension Mother    Thyroid disease Mother    Diabetes Mother    Stroke Mother    Heart failure Mother    Diabetes Father    Heart attack Father    Diabetes Sister    Diabetes Brother     Outpatient Encounter Medications as of 06/17/2023  Medication Sig   acyclovir (ZOVIRAX) 400 MG tablet TAKE 1 TABLET BY MOUTH EVERY TWELVE HOURS (AT BREAKFAST AND AT BEDTIME)   alendronate (FOSAMAX) 70 MG tablet Take 1 tablet (70 mg total) by mouth every 7 (seven) days. Take with a full glass of water on an empty stomach.   Apple  Cider Vinegar 300 MG TABS Take by mouth.   aspirin 81 MG chewable tablet Chew 1 tablet (81 mg total) by mouth 2 (two) times daily.   atorvastatin (LIPITOR) 20 MG tablet TAKE 1 TABLET BY MOUTH AT BEDTIME   Blood Pressure Monitoring (BLOOD PRESSURE MONITOR/L CUFF) MISC Dx: R03.0, elevated bp in office   cholecalciferol (VITAMIN D3) 25 MCG (1000 UNIT) tablet Take 1,000 Units by mouth daily.   clonazePAM (KLONOPIN) 0.5 MG tablet TAKE 1 TABLET BY MOUTH AT BEDTIME   Continuous Glucose Sensor (DEXCOM G7 SENSOR) MISC Use to test BG 4+ times daily. Change sensor every 10 days. E11.65   donepezil (ARICEPT) 10 MG tablet TAKE 1 TABLET BY MOUTH EVERY DAY   EPINEPHrine 0.3 mg/0.3 mL IJ SOAJ injection Inject 0.3 mg into the muscle as needed for anaphylaxis.   escitalopram (LEXAPRO) 20 MG tablet TAKE 1 TABLET BY MOUTH EVERY DAY   glucose blood (TRUE METRIX BLOOD GLUCOSE TEST) test strip USE AS DIRECTED   hydrocortisone (ANUSOL-HC) 2.5 % rectal cream Place 1 Application rectally 2 (two) times daily.   Insulin Pen Needle (BD AUTOSHIELD DUO) 30G X 5 MM MISC Use 4 times daily as instructed   levothyroxine (SYNTHROID) 112 MCG tablet TAKE 1  TABLET BY MOUTH DAILY   loperamide (IMODIUM) 2 MG capsule Take 2 mg by mouth See admin instructions. Take 2 mg PO after each loose stool as needed for diarrhea (max of 3 doses in 24 hours)   methocarbamol (ROBAXIN) 500 MG tablet Take 1 tablet (500 mg total) by mouth 2 (two) times daily.   metoCLOPramide (REGLAN) 5 MG tablet TAKE 1 TABLET BY MOUTH FOUR TIMES DAILY   nystatin cream (MYCOSTATIN) apply TO SKIN folds, UNDER breasts, abdominal folds, AND inguinal folds TWICE DAILY FOR candidasis   ondansetron (ZOFRAN) 4 MG tablet Take 1 tablet (4 mg total) by mouth every 6 (six) hours as needed.   polyethylene glycol powder (GLYCOLAX/MIRALAX) 17 GM/SCOOP powder SMARTSIG:1 Capful(s) By Mouth Daily   prazosin (MINIPRESS) 2 MG capsule TAKE ONE CAPSULE BY MOUTH AT BEDTIME   predniSONE (DELTASONE) 20 MG tablet Take 1 tablet (20 mg total) by mouth 2 (two) times daily with a meal.   senna (SENOKOT) 8.6 MG tablet Take 1 tablet (8.6 mg total) by mouth daily.   Specialty Vitamins Products (COLLAGEN ULTRA PO) Take by mouth.   Tetrahydrozoline-Zn Sulfate (EYE DROPS AR OP) Apply to eye.   traZODone (DESYREL) 50 MG tablet TAKE 2 TABLETS BY MOUTH AT BEDTIME   trospium (SANCTURA) 20 MG tablet Take 1 tablet (20 mg total) by mouth 2 (two) times daily.   Vibegron (GEMTESA) 75 MG TABS Take 75 mg by mouth daily.   vitamin B-12 (CYANOCOBALAMIN) 1000 MCG tablet Take 1,000 mcg by mouth daily.   Vitamin D, Ergocalciferol, (DRISDOL) 1.25 MG (50000 UNIT) CAPS capsule TAKE ONE CAPSULE BY MOUTH ONCE WEEKLY ON TUESDAY   [DISCONTINUED] insulin glargine (LANTUS) 100 UNIT/ML Solostar Pen Inject 40 Units into the skin at bedtime.   [DISCONTINUED] insulin lispro (HUMALOG) 100 UNIT/ML KwikPen Inject 8-14 Units into the skin 3 (three) times daily.   [DISCONTINUED] Semaglutide (RYBELSUS) 14 MG TABS Take 1 tablet (14 mg total) by mouth daily.   insulin glargine (LANTUS) 100 UNIT/ML Solostar Pen Inject 40 Units into the skin at bedtime.    insulin lispro (HUMALOG) 100 UNIT/ML KwikPen Inject 8-14 Units into the skin 3 (three) times daily.   Semaglutide (RYBELSUS) 14 MG TABS Take 1 tablet (14 mg total)  by mouth daily.   No facility-administered encounter medications on file as of 06/17/2023.    ALLERGIES: Allergies  Allergen Reactions   Ambien [Zolpidem Tartrate] Other (See Comments)    Hallucinations/nightmares   Bee Venom Swelling   Coconut Flavor Swelling    Mouth swells   Codeine Nausea And Vomiting   Onion Nausea Only and Other (See Comments)    Raw onions   Other     Ground Beef (patient avoid due to side effect of diarrhea)   Sulfa Antibiotics Rash and Other (See Comments)    Including topical sulfa  (mouth dries/peels/skin blisters)    VACCINATION STATUS: Immunization History  Administered Date(s) Administered   Fluad Quad(high Dose 65+) 12/03/2022   Pneumococcal Polysaccharide-23 08/22/2022   Tdap 05/29/2005, 05/22/2022   Zoster Recombinat (Shingrix) 05/22/2022    Diabetes She presents for her follow-up diabetic visit. She has type 2 diabetes mellitus. Onset time: Diagnosed at approx age of 5. Her disease course has been improving. There are no hypoglycemic associated symptoms. Associated symptoms include foot paresthesias and weight loss. There are no hypoglycemic complications. Symptoms are stable. There are no diabetic complications. Risk factors for coronary artery disease include diabetes mellitus, family history, obesity, sedentary lifestyle and post-menopausal. Current diabetic treatment includes intensive insulin program and oral agent (monotherapy). She is compliant with treatment most of the time. Her weight is decreasing steadily. She is following a generally healthy diet. Meal planning includes avoidance of concentrated sweets. She has not had a previous visit with a dietitian. She participates in exercise daily. Her home blood glucose trend is decreasing steadily. Her overall blood glucose range is  140-180 mg/dl. (She presents today with her logs, no meter and CGM (requesting assistance starting new sensor) showing mostly at goal glycemic profile.  Her POCT A1c today is 6.2%, improving from last visit of 8.5%.  She denies any hypoglycemia.  She has tolerated the addition of Rybelsus well, the constipation has resolved with her eating healthier high fiber items.  Analysis of her CGM shows TIR 81%, TAR 18%, TBR <2% with a GMI of 6.8%.) An ACE inhibitor/angiotensin II receptor blocker is being taken. She sees a podiatrist.Eye exam is current.    Review of systems  Constitutional: + decreasing body weight, current Body mass index is 29.48 kg/m., no fatigue, no subjective hyperthermia, no subjective hypothermia Eyes: no blurry vision, no xerophthalmia ENT: no sore throat, no nodules palpated in throat, no dysphagia/odynophagia, no hoarseness Cardiovascular: no chest pain, no shortness of breath, no palpitations, no leg swelling Respiratory: no cough, no shortness of breath Gastrointestinal: no nausea/vomiting/diarrhea Musculoskeletal: left shoulder and rib pain following recent fall Skin: no rashes, no hyperemia Neurological: no tremors, + numbness/tingling to BLE and bilateral hands, no dizziness Psychiatric: no depression, no anxiety  Objective:     BP 105/66 (BP Location: Right Arm, Patient Position: Sitting, Cuff Size: Large)   Pulse 78   Ht 5' 6.5" (1.689 m)   Wt 185 lb 6.4 oz (84.1 kg)   BMI 29.48 kg/m   Wt Readings from Last 3 Encounters:  06/17/23 185 lb 6.4 oz (84.1 kg)  04/05/23 194 lb (88 kg)  03/22/23 193 lb 6.4 oz (87.7 kg)     BP Readings from Last 3 Encounters:  06/17/23 105/66  04/05/23 118/70  03/22/23 118/63      Physical Exam- Limited  Constitutional:  Body mass index is 29.48 kg/m. , not in acute distress, normal state of mind Eyes:  EOMI, no exophthalmos Musculoskeletal:  ecchymosis to left shoulder from recent fall Skin:  no rashes, no  hyperemia Neurological: no tremor with outstretched hands   Diabetic Foot Exam - Simple   No data filed     CMP ( most recent) CMP     Component Value Date/Time   NA 139 02/22/2023 1341   K 4.5 02/22/2023 1341   CL 100 02/22/2023 1341   CO2 23 02/22/2023 1341   GLUCOSE 115 (H) 02/22/2023 1341   GLUCOSE 102 (H) 04/25/2021 1343   BUN 14 02/22/2023 1341   CREATININE 0.67 02/22/2023 1341   CALCIUM 9.6 02/22/2023 1341   PROT 6.7 02/22/2023 1341   ALBUMIN 4.2 02/22/2023 1341   AST 36 02/22/2023 1341   ALT 33 (H) 02/22/2023 1341   ALKPHOS 41 (L) 02/22/2023 1341   BILITOT 0.5 02/22/2023 1341   GFRNONAA >60 04/25/2021 1343   GFRAA >60 09/12/2019 0239     Diabetic Labs (most recent): Lab Results  Component Value Date   HGBA1C 6.2 (A) 06/17/2023   HGBA1C 8.5 (H) 02/22/2023   HGBA1C 8.8 (A) 11/29/2022     Lipid Panel ( most recent) Lipid Panel     Component Value Date/Time   CHOL 140 02/22/2023 1341   TRIG 107 02/22/2023 1341   HDL 52 02/22/2023 1341   CHOLHDL 2.7 02/22/2023 1341   LDLCALC 68 02/22/2023 1341   LABVLDL 20 02/22/2023 1341      Lab Results  Component Value Date   TSH 0.648 02/22/2023   TSH 0.317 (L) 08/22/2022   TSH 0.403 (L) 05/22/2022   FREET4 1.25 02/22/2023   FREET4 1.46 08/22/2022   FREET4 1.52 05/22/2022           Assessment & Plan:   1) Type 2 diabetes mellitus without complication, with long-term current use of insulin (HCC)  She presents today with her logs, no meter and CGM (requesting assistance starting new sensor) showing mostly at goal glycemic profile.  Her POCT A1c today is 6.2%, improving from last visit of 8.5%.  She denies any hypoglycemia.  She has tolerated the addition of Rybelsus well, the constipation has resolved with her eating healthier high fiber items.  Analysis of her CGM shows TIR 81%, TAR 18%, TBR <2% with a GMI of 6.8%.  - [name] has currently uncontrolled symptomatic type 2 DM since 72 years of  age.   -Recent labs reviewed.  - I had a long discussion with her about the progressive nature of diabetes and the pathology behind its complications. -her diabetes is not currently complicated but she remains at a high risk for more acute and chronic complications which include CAD, CVA, CKD, retinopathy, and neuropathy. These are all discussed in detail with her.  The following Lifestyle Medicine recommendations according to American College of Lifestyle Medicine [facility]) were discussed and offered to patient and she agrees to start the journey:  A. Whole Foods, Plant-based plate comprising of fruits and vegetables, plant-based proteins, whole-grain carbohydrates was discussed in detail with the patient.   A list for source of those nutrients were also provided to the patient.  Patient will use only water or unsweetened tea for hydration. B.  The need to stay away from risky substances including alcohol, smoking; obtaining 7 to 9 hours of restorative sleep, at least 150 minutes of moderate intensity exercise weekly, the importance of healthy social connections,  and stress reduction techniques were discussed. C.  A full color page of  Calorie density of various food groups  per pound showing examples of each food groups was provided to the patient.  - Nutritional counseling repeated at each appointment due to patients tendency to fall back in to old habits.  - The patient admits there is a room for improvement in their diet and drink choices. -  Suggestion is made for the patient to avoid simple carbohydrates from their diet including Cakes, Sweet Desserts / Pastries, Ice Cream, Soda (diet and regular), Sweet Tea, Candies, Chips, Cookies, Sweet Pastries, Store Bought Juices, Alcohol in Excess of 1-2 drinks a day, Artificial Sweeteners, Coffee Creamer, and "Sugar-free" Products. This will help patient to have stable blood glucose profile and potentially avoid unintended weight gain.   - I encouraged  the patient to switch to unprocessed or minimally processed complex starch and increased protein intake (animal or plant source), fruits, and vegetables.   - Patient is advised to stick to a routine mealtimes to eat 3 meals a day and avoid unnecessary snacks (to snack only to correct hypoglycemia).  - I have approached her with the following individualized plan to manage her diabetes and patient agrees:   -She is advised to continue Lantus 40 units SQ nightly and continue her Humalog 8-14 units TID with meals if glucose is above 90 and she is eating (Specific instructions on how to titrate insulin dosage based on glucose readings given to patient in writing).  She can also continue Rybelsus 14 mg po daily before breakfast.   -she is encouraged to continue monitoring glucose 4 times daily (using her CGM), before meals and before bed, and to call the clinic if she has readings less than 70 or above 300 for 3 tests in a row.   - she is warned not to take insulin without proper monitoring per orders. - Adjustment parameters are given to her for hypo and hyperglycemia in writing.  - she is not a candidate for sulfa meds due to allergy.  - Specific targets for  A1c; LDL, HDL, and Triglycerides were discussed with the patient.  2) Blood Pressure /Hypertension:  her blood pressure is controlled to target without the use of antihypertensive medications.  3) Lipids/Hyperlipidemia:    Review of her recent lipid panel from 02/22/23 showed controlled LDL at 68.  she is advised to continue Lipitor 20 mg daily at bedtime.  Side effects and precautions discussed with her.  4)  Weight/Diet:  her Body mass index is 29.48 kg/m.  -  clearly complicating her diabetes care.   she is a candidate for weight loss. I discussed with her the fact that loss of 5 - 10% of her  current body weight will have the most impact on her diabetes management.  Exercise, and detailed carbohydrates information provided  -  detailed on  discharge instructions.  5) Hypothyroidism-unspecified There are no recent TFTs to review.  She is advised to continue Levothyroxine 112 mcg po daily before breakfast.  Will recheck TFTs prior to next visit.   - The correct intake of thyroid hormone (Levothyroxine, Synthroid), is on empty stomach first thing in the morning, with water, separated by at least 30 minutes from breakfast and other medications,  and separated by more than 4 hours from calcium, iron, multivitamins, acid reflux medications (PPIs).  - This medication is a life-long medication and will be needed to correct thyroid hormone imbalances for the rest of your life.  The dose may change from time to time, based on thyroid blood work.  - It is extremely important  to be consistent taking this medication, near the same time each morning.  -AVOID TAKING PRODUCTS CONTAINING BIOTIN (commonly found in Hair, Skin, Nails vitamins) AS IT INTERFERES WITH THE VALIDITY OF THYROID FUNCTION BLOOD TESTS.  6) Vitamin D deficiency Her recent vitamin D level was low at 21.4.  Her PCP put her on Ergocalciferol 50000 units weekly.  7) Chronic Care/Health Maintenance: -she is not on ACEI/ARB and is on Statin medications and is encouraged to initiate and continue to follow up with Ophthalmology, Dentist, Podiatrist at least yearly or according to recommendations, and advised to stay away from smoking. I have recommended yearly flu vaccine and pneumonia vaccine at least every 5 years; moderate intensity exercise for up to 150 minutes weekly; and sleep for at least 7 hours a day.  - she is advised to maintain close follow up with Gilmore Laroche, FNP for primary care needs, as well as her other providers for optimal and coordinated care.     I spent  42  minutes in the care of the patient today including review of labs from CMP, Lipids, Thyroid Function, Hematology (current and previous including abstractions from other facilities); face-to-face time  discussing  her blood glucose readings/logs, discussing hypoglycemia and hyperglycemia episodes and symptoms, medications doses, her options of short and long term treatment based on the latest standards of care / guidelines;  discussion about incorporating lifestyle medicine;  and documenting the encounter. Risk reduction counseling performed per USPSTF guidelines to reduce obesity and cardiovascular risk factors.     Please refer to Patient Instructions for Blood Glucose Monitoring and Insulin/Medications Dosing Guide"  in media tab for additional information. Please  also refer to " Patient Self Inventory" in the Media  tab for reviewed elements of pertinent patient history.  [name] participated in the discussions, expressed understanding, and voiced agreement with the above plans.  All questions were answered to her satisfaction. she is encouraged to contact clinic should she have any questions or concerns prior to her return visit.     Follow up plan: - Return in about 4 months (around 10/17/2023) for Diabetes F/U with A1c in office, Thyroid follow up, Previsit labs, Bring meter and logs.  Ronny Bacon, [facility] [address] [place], [place] [address] Phone: [phone] Fax: [phone]  06/17/2023, 9:26 AM

## 2023-06-20 ENCOUNTER — Telehealth: Payer: Self-pay | Admitting: Family Medicine

## 2023-06-20 NOTE — Telephone Encounter (Signed)
Patient called to follow up on recent referral to orthopedic specialist; stated provider was outside of her insurance's network.  Requesting to be referred to a different facility.  Please advise at [phone].

## 2023-06-21 NOTE — Telephone Encounter (Signed)
Contacted patient, [place] of county, gave her phone #

## 2023-06-21 NOTE — Telephone Encounter (Signed)
We have not referred her to orthopedics. Looks like she went to [facility] for a fall but we have not seen her for this issue. If she is needing orthopedics she can go to the walk in clinic at [facility] on [address] [place] without a referral. Their number is [phone]

## 2023-06-24 ENCOUNTER — Other Ambulatory Visit (HOSPITAL_COMMUNITY): Payer: Self-pay

## 2023-06-24 ENCOUNTER — Telehealth: Payer: Self-pay

## 2023-06-24 NOTE — Transitions of Care (Post Inpatient/ED Visit) (Signed)
06/24/2023  Name: [name] MRN: [id number] DOB: 04/30/1951  Today's TOC FU Call Status: Today's TOC FU Call Status:: Successful TOC FU Call Competed TOC FU Call Complete Date: 06/24/23  Transition Care Management Follow-up Telephone Call Date of Discharge: 06/20/23 Discharge Facility: Other ([facility]) Name of Other ([facility]) Discharge Facility: [facility] Type of Discharge: Emergency Department Reason for ED Visit: Other: (fall) How have you been since you were released from the hospital?: Better Any questions or concerns?: No  Items Reviewed: Did you receive and understand the discharge instructions provided?: Yes Medications obtained,verified, and reconciled?: Yes (Medications Reviewed) Any new allergies since your discharge?: No Dietary orders reviewed?: Yes Do you have support at home?: Yes  Medications Reviewed Today: Medications Reviewed Today     Reviewed by Merleen Nicely, LPN (Licensed Practical Nurse) on 06/24/23 at 1439  Med List Status: <None>   Medication Order Taking? Sig Documenting Provider Last Dose Status Informant  acyclovir (ZOVIRAX) 400 MG tablet [id number] Yes TAKE 1 TABLET BY MOUTH EVERY TWELVE HOURS (AT BREAKFAST AND AT BEDTIME) Gilmore Laroche, FNP Taking Active   alendronate (FOSAMAX) 70 MG tablet [id number] Yes Take 1 tablet (70 mg total) by mouth every 7 (seven) days. Take with a full glass of water on an empty stomach. Gilmore Laroche, FNP Taking Active   Apple Cider Vinegar 300 MG TABS [id number] Yes Take by mouth. [provider] Taking Active   aspirin 81 MG chewable tablet [id number] Yes Chew 1 tablet (81 mg total) by mouth 2 (two) times daily. Margart Sickles, PA-C Taking Active Nursing Home Medication Administration Guide (MAG)  atorvastatin (LIPITOR) 20 MG tablet [id number] Yes TAKE 1 TABLET BY MOUTH AT BEDTIME Gilmore Laroche, FNP Taking Active   Blood Pressure Monitoring (BLOOD PRESSURE MONITOR/L CUFF) MISC [id number] Yes  Dx: R03.0, elevated bp in office Gilmore Laroche, FNP Taking Active   cholecalciferol (VITAMIN D3) 25 MCG (1000 UNIT) tablet [id number] Yes Take 1,000 Units by mouth daily. [provider] Taking Active Nursing Home Medication Administration Guide (MAG)           Med Note Shona Needles   Thu Mar 07, 2023  1:15 PM) Patient states that she is taking 5000 units a week  clonazePAM (KLONOPIN) 0.5 MG tablet [id number] Yes TAKE 1 TABLET BY MOUTH AT BEDTIME Gilmore Laroche, FNP Taking Active   Continuous Glucose Sensor (DEXCOM G7 SENSOR) MISC [id number] Yes Use to test BG 4+ times daily. Change sensor every 10 days. E11.65 Dani Gobble, NP Taking Active   donepezil (ARICEPT) 10 MG tablet [id number] Yes TAKE 1 TABLET BY MOUTH EVERY DAY Gilmore Laroche, FNP Taking Active   EPINEPHrine 0.3 mg/0.3 mL IJ SOAJ injection [id number] Yes Inject 0.3 mg into the muscle as needed for anaphylaxis. Gilmore Laroche, FNP Taking Active   escitalopram (LEXAPRO) 20 MG tablet [id number] Yes TAKE 1 TABLET BY MOUTH EVERY DAY Gilmore Laroche, FNP Taking Active   glucose blood (TRUE METRIX BLOOD GLUCOSE TEST) test strip [id number] Yes USE AS DIRECTED Gilmore Laroche, FNP Taking Active   hydrocortisone (ANUSOL-HC) 2.5 % rectal cream [id number] Yes Place 1 Application rectally 2 (two) times daily. Gilmore Laroche, FNP Taking Active   insulin glargine (LANTUS) 100 UNIT/ML Solostar Pen [id number] Yes Inject 40 Units into the skin at bedtime. Dani Gobble, NP Taking Active   insulin lispro (HUMALOG) 100 UNIT/ML KwikPen [id number] Yes Inject 8-14 Units into the skin 3 (three) times daily. Dani Gobble, NP Taking Active   Insulin Pen  Needle (BD AUTOSHIELD DUO) 30G X 5 MM MISC [id number] Yes Use 4 times daily as instructed Gilmore Laroche, FNP Taking Active   levothyroxine (SYNTHROID) 112 MCG tablet [id number] Yes TAKE 1 TABLET BY MOUTH DAILY Gilmore Laroche, FNP Taking Active   loperamide (IMODIUM) 2 MG capsule [id number]  Yes Take 2 mg by mouth See admin instructions. Take 2 mg PO after each loose stool as needed for diarrhea (max of 3 doses in 24 hours) [provider] Taking Active Nursing Home Medication Administration Guide (MAG)  methocarbamol (ROBAXIN) 500 MG tablet [id number] Yes Take 1 tablet (500 mg total) by mouth 2 (two) times daily. Gilmore Laroche, FNP Taking Active   metoCLOPramide (REGLAN) 5 MG tablet [id number] Yes TAKE 1 TABLET BY MOUTH FOUR TIMES DAILY Gilmore Laroche, FNP Taking Active   nystatin cream (MYCOSTATIN) [id number] Yes apply TO SKIN folds, UNDER breasts, abdominal folds, AND inguinal folds TWICE DAILY FOR candidasis Gilmore Laroche, FNP Taking Active   ondansetron (ZOFRAN) 4 MG tablet [id number] Yes Take 1 tablet (4 mg total) by mouth every 6 (six) hours as needed. Billie Lade, MD Taking Active   polyethylene glycol powder [facility]) 17 GM/SCOOP powder [id number] Yes SMARTSIG:1 Capful(s) By Mouth Daily [provider] Taking Active   prazosin (MINIPRESS) 2 MG capsule [id number] Yes TAKE ONE CAPSULE BY MOUTH AT BEDTIME Gilmore Laroche, FNP Taking Active   predniSONE (DELTASONE) 20 MG tablet [id number] Yes Take 1 tablet (20 mg total) by mouth 2 (two) times daily with a meal. Kerri Perches, MD Taking Active   Semaglutide [facility]) 14 MG TABS [id number] Yes Take 1 tablet (14 mg total) by mouth daily. Dani Gobble, NP Taking Active   senna (SENOKOT) 8.6 MG tablet [id number] Yes Take 1 tablet (8.6 mg total) by mouth daily. Gilmore Laroche, FNP Taking Active   Specialty Vitamins Products (COLLAGEN ULTRA PO) [id number] Yes Take by mouth. [provider] Taking Active   Tetrahydrozoline-Zn Sulfate (EYE DROPS AR OP) [id number] Yes Apply to eye. [provider] Taking Active   traZODone (DESYREL) 50 MG tablet [id number] Yes TAKE 2 TABLETS BY MOUTH AT BEDTIME Gilmore Laroche, FNP Taking Active   trospium (SANCTURA) 20 MG tablet [id number] Yes Take 1  tablet (20 mg total) by mouth 2 (two) times daily. Bjorn Pippin, MD Taking Active   Vibegron [facility]) 75 MG TABS [id number] Yes Take 75 mg by mouth daily. Stoneking, Danford Bad., MD Taking Active   vitamin B-12 (CYANOCOBALAMIN) 1000 MCG tablet [id number] Yes Take 1,000 mcg by mouth daily. [provider] Taking Active Nursing Home Medication Administration Guide (MAG)  Vitamin D, Ergocalciferol, (DRISDOL) 1.25 MG (50000 UNIT) CAPS capsule [id number] Yes TAKE ONE CAPSULE BY MOUTH ONCE WEEKLY ON Loura Back, FNP Taking Active   Med List Note Sharlene Dory, CPhT 04/25/21 1517): [employer] [facility]             Home Care and Equipment/Supplies: Were Home Health Services Ordered?: NA Any new equipment or medical supplies ordered?: NA  Functional Questionnaire: Do you need assistance with bathing/showering or dressing?: No Do you need assistance with meal preparation?: No Do you need assistance with eating?: No Do you have difficulty maintaining continence: No Do you need assistance with getting out of bed/getting out of a chair/moving?: No Do you have difficulty managing or taking your medications?: No  Follow up appointments reviewed: PCP Follow-up appointment confirmed?: No MD Provider Line Number:[phone] Given: Yes Specialist Hospital Follow-up appointment confirmed?: No Reason Specialist Follow-Up  Not Confirmed: Patient has Specialist Provider Number and will Call for Appointment Do you need transportation to your follow-up appointment?: No Do you understand care options if your condition(s) worsen?: Yes-patient verbalized understanding    SIGNATURE  Woodfin Ganja LPN [facility] Nurse Health Advisor Direct Dial [phone]

## 2023-06-24 NOTE — Telephone Encounter (Signed)
Patient Advocate Encounter   Received notification from [facility] that prior authorization is required for Dexcom G7 sensor  Per test claim:    PA is not required

## 2023-07-01 NOTE — Telephone Encounter (Signed)
Patient was called and made aware. 

## 2023-07-08 ENCOUNTER — Ambulatory Visit (INDEPENDENT_AMBULATORY_CARE_PROVIDER_SITE_OTHER): Payer: 59 | Admitting: Family Medicine

## 2023-07-08 ENCOUNTER — Encounter: Payer: Self-pay | Admitting: Family Medicine

## 2023-07-08 VITALS — BP 128/68 | HR 92 | Ht 66.5 in | Wt 181.0 lb

## 2023-07-08 DIAGNOSIS — E1169 Type 2 diabetes mellitus with other specified complication: Secondary | ICD-10-CM

## 2023-07-08 DIAGNOSIS — E559 Vitamin D deficiency, unspecified: Secondary | ICD-10-CM | POA: Diagnosis not present

## 2023-07-08 DIAGNOSIS — K921 Melena: Secondary | ICD-10-CM

## 2023-07-08 DIAGNOSIS — F419 Anxiety disorder, unspecified: Secondary | ICD-10-CM

## 2023-07-08 DIAGNOSIS — Z794 Long term (current) use of insulin: Secondary | ICD-10-CM

## 2023-07-08 DIAGNOSIS — E1101 Type 2 diabetes mellitus with hyperosmolarity with coma: Secondary | ICD-10-CM | POA: Diagnosis not present

## 2023-07-08 DIAGNOSIS — E038 Other specified hypothyroidism: Secondary | ICD-10-CM

## 2023-07-08 DIAGNOSIS — E785 Hyperlipidemia, unspecified: Secondary | ICD-10-CM

## 2023-07-08 NOTE — Progress Notes (Signed)
Established Patient Office Visit  Subjective:  Patient ID: [name], female    DOB: 03/28/1951  Age: 72 y.o. MRN: [id number]  CC:  Chief Complaint  Patient presents with   Chronic Care Management    3 month f/u   Stool Color Change    Pt reports black stools ongoing for 06/17/2023.     HPI [name] is a 72 y.o. female with past medical history type 2 diabetes, hypothyroidism, and anxiety of the last time seeing you presents for f/u of  chronic medical conditions. For the details of today's visit, please refer to the assessment and plan.     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   Procedure: TOTAL KNEE ARTHROPLASTY;  Surgeon: Frederico Hamman, MD;  Location: [facility];  Service: Orthopedics;  Laterality: Right;    Family History  Problem Relation Age of Onset   Cancer Mother    Hypertension Mother    Thyroid disease Mother    Diabetes Mother    Stroke Mother    Heart failure Mother    Diabetes Father    Heart attack Father    Diabetes Sister    Diabetes Brother     Social History   Socioeconomic History   Marital status: Married    Spouse name: Not on file   Number of children: Not on file   Years of education: Not on file   Highest education level: Not on file  Occupational History   Not on file  Tobacco Use   Smoking status: Former    Current  packs/day: 0.00    Average packs/day: 0.5 packs/day for 15.0 years (7.5 ttl pk-yrs)    Types: Cigarettes    Start date: 31    Quit date: 2013    Years since quitting: 11.5   Smokeless tobacco: Never  Vaping Use   Vaping status: Never Used  Substance and Sexual Activity   Alcohol use: Never   Drug use: Never   Sexual activity: Not on file  Other Topics Concern   Not on file  Social History Narrative   Not on file   Social Determinants of Health   Financial Resource Strain: Not on file  Food Insecurity: Not on file  Transportation Needs: Not on file  Physical Activity: Not on file  Stress: Not on file  Social Connections: Not on file  Intimate Partner Violence: Not on file    Outpatient Medications Prior to Visit  Medication Sig Dispense Refill   acyclovir (ZOVIRAX) 400 MG tablet TAKE 1 TABLET BY MOUTH EVERY TWELVE HOURS (AT BREAKFAST AND AT BEDTIME) 90 tablet 1   alendronate (FOSAMAX) 70 MG tablet Take 1 tablet (70 mg total) by mouth every 7 (seven) days. Take  with a full glass of water on an empty stomach. 4 tablet 11   Apple Cider Vinegar 300 MG TABS Take by mouth.     aspirin 81 MG chewable tablet Chew 1 tablet (81 mg total) by mouth 2 (two) times daily. 60 tablet 0   atorvastatin (LIPITOR) 20 MG tablet TAKE 1 TABLET BY MOUTH AT BEDTIME 90 tablet 1   Blood Pressure Monitoring (BLOOD PRESSURE MONITOR/L CUFF) MISC Dx: R03.0, elevated bp in office 1 each 0   cholecalciferol (VITAMIN D3) 25 MCG (1000 UNIT) tablet Take 1,000 Units by mouth daily.     clonazePAM (KLONOPIN) 0.5 MG tablet TAKE 1 TABLET BY MOUTH AT BEDTIME 30 tablet 1   Continuous Glucose Sensor (DEXCOM G7 SENSOR) MISC Use to test BG 4+ times daily. Change sensor every 10 days. E11.65 9 each 4   donepezil (ARICEPT) 10 MG tablet TAKE 1 TABLET BY MOUTH EVERY DAY 90 tablet 1   EPINEPHrine 0.3 mg/0.3 mL IJ SOAJ injection Inject 0.3 mg into the muscle as needed for anaphylaxis. 1 each 2   escitalopram (LEXAPRO) 20 MG  tablet TAKE 1 TABLET BY MOUTH EVERY DAY 30 tablet 2   glucose blood (TRUE METRIX BLOOD GLUCOSE TEST) test strip USE AS DIRECTED 100 each 2   hydrocortisone (ANUSOL-HC) 2.5 % rectal cream Place 1 Application rectally 2 (two) times daily. 30 g 0   insulin glargine (LANTUS) 100 UNIT/ML Solostar Pen Inject 40 Units into the skin at bedtime. 36 mL 3   insulin lispro (HUMALOG) 100 UNIT/ML KwikPen Inject 8-14 Units into the skin 3 (three) times daily. 36 mL 3   Insulin Pen Needle (BD AUTOSHIELD DUO) 30G X 5 MM MISC Use 4 times daily as instructed 100 each 2   levothyroxine (SYNTHROID) 112 MCG tablet TAKE 1 TABLET BY MOUTH DAILY 90 tablet 1   loperamide (IMODIUM) 2 MG capsule Take 2 mg by mouth See admin instructions. Take 2 mg PO after each loose stool as needed for diarrhea (max of 3 doses in 24 hours)     methocarbamol (ROBAXIN) 500 MG tablet Take 1 tablet (500 mg total) by mouth 2 (two) times daily. 30 tablet 0   metoCLOPramide (REGLAN) 5 MG tablet TAKE 1 TABLET BY MOUTH FOUR TIMES DAILY 30 tablet 0   nystatin cream (MYCOSTATIN) apply TO SKIN folds, UNDER breasts, abdominal folds, AND inguinal folds TWICE DAILY FOR candidasis 30 g 1   ondansetron (ZOFRAN) 4 MG tablet Take 1 tablet (4 mg total) by mouth every 6 (six) hours as needed. 30 tablet 2   polyethylene glycol powder (GLYCOLAX/MIRALAX) 17 GM/SCOOP powder SMARTSIG:1 Capful(s) By Mouth Daily     prazosin (MINIPRESS) 2 MG capsule TAKE ONE CAPSULE BY MOUTH AT BEDTIME 90 capsule 1   predniSONE (DELTASONE) 20 MG tablet Take 1 tablet (20 mg total) by mouth 2 (two) times daily with a meal. 10 tablet 0   Semaglutide (RYBELSUS) 14 MG TABS Take 1 tablet (14 mg total) by mouth daily. 90 tablet 3   senna (SENOKOT) 8.6 MG tablet Take 1 tablet (8.6 mg total) by mouth daily. 60 tablet 1   Specialty Vitamins Products (COLLAGEN ULTRA PO) Take by mouth.     Tetrahydrozoline-Zn Sulfate (EYE DROPS AR OP) Apply to eye.     traZODone (DESYREL) 50 MG tablet TAKE 2  TABLETS BY MOUTH AT BEDTIME 30 tablet 2   trospium (SANCTURA) 20 MG tablet Take 1 tablet (20 mg total) by mouth 2 (two) times daily. 60 tablet  11   Vibegron (GEMTESA) 75 MG TABS Take 75 mg by mouth daily. 28 tablet 0   vitamin B-12 (CYANOCOBALAMIN) 1000 MCG tablet Take 1,000 mcg by mouth daily.     Vitamin D, Ergocalciferol, (DRISDOL) 1.25 MG (50000 UNIT) CAPS capsule TAKE ONE CAPSULE BY MOUTH ONCE WEEKLY ON TUESDAY 10 capsule 1   No facility-administered medications prior to visit.    Allergies  Allergen Reactions   Ambien [Zolpidem Tartrate] Other (See Comments)    Hallucinations/nightmares   Bee Venom Swelling   Coconut Flavor Swelling    Mouth swells   Codeine Nausea And Vomiting   Onion Nausea Only and Other (See Comments)    Raw onions   Other     Ground Beef (patient avoid due to side effect of diarrhea)   Sulfa Antibiotics Rash and Other (See Comments)    Including topical sulfa  (mouth dries/peels/skin blisters)    ROS Review of Systems  Constitutional:  Negative for chills and fever.  Eyes:  Negative for visual disturbance.  Respiratory:  Negative for chest tightness and shortness of breath.   Gastrointestinal:        Dark stools for 3 weeks  Neurological:  Negative for dizziness and headaches.      Objective:    Physical Exam HENT:     Head: Normocephalic.     Mouth/Throat:     Mouth: Mucous membranes are moist.  Cardiovascular:     Rate and Rhythm: Normal rate.     Heart sounds: Normal heart sounds.  Pulmonary:     Effort: Pulmonary effort is normal.     Breath sounds: Normal breath sounds.  Abdominal:     Palpations: There is no mass.     Tenderness: There is no abdominal tenderness.  Neurological:     Mental Status: She is alert.     BP 128/68   Pulse 92   Ht 5' 6.5" (1.689 m)   Wt 181 lb 0.6 oz (82.1 kg)   SpO2 98%   BMI 28.78 kg/m  Wt Readings from Last 3 Encounters:  07/08/23 181 lb 0.6 oz (82.1 kg)  06/17/23 185 lb 6.4 oz (84.1 kg)   04/05/23 194 lb (88 kg)    Lab Results  Component Value Date   TSH 0.648 02/22/2023   Lab Results  Component Value Date   WBC 6.1 02/22/2023   HGB 12.2 02/22/2023   HCT 37.6 02/22/2023   MCV 85 02/22/2023   PLT 248 02/22/2023   Lab Results  Component Value Date   NA 139 02/22/2023   K 4.5 02/22/2023   CO2 23 02/22/2023   GLUCOSE 115 (H) 02/22/2023   BUN 14 02/22/2023   CREATININE 0.67 02/22/2023   BILITOT 0.5 02/22/2023   ALKPHOS 41 (L) 02/22/2023   AST 36 02/22/2023   ALT 33 (H) 02/22/2023   PROT 6.7 02/22/2023   ALBUMIN 4.2 02/22/2023   CALCIUM 9.6 02/22/2023   ANIONGAP 8 04/25/2021   EGFR 92 02/22/2023   Lab Results  Component Value Date   CHOL 140 02/22/2023   Lab Results  Component Value Date   HDL 52 02/22/2023   Lab Results  Component Value Date   LDLCALC 68 02/22/2023   Lab Results  Component Value Date   TRIG 107 02/22/2023   Lab Results  Component Value Date   CHOLHDL 2.7 02/22/2023   Lab Results  Component Value Date   HGBA1C 6.2 (A) 06/17/2023      Assessment & Plan:  Melena Assessment & Plan: Reports having loose "explosive watery dark stools for 3 weeks". Noted fecal incontinence when she feels the urge to have a BM No hematochezia, or hematemesis reports Denies symptoms of dizziness, fatigue and lightheadedness Advised to avoid NSAIDs products No hx of IBS  No c/o rectal or abdominal pain' No unintentional wt loss reported Encouraged increasing her intake of fiber supplement Fiber supplement The recommended amount of fiber is 20 to 35 grams a day.  If you can't get enough fiber from food, you can add wheat bran to the foods you do eat. I recommend Benefiber powder 2 teaspoons mixed in 8 ounces of liquid daily   Orders: -     Ambulatory referral to Gastroenterology  Type 2 diabetes mellitus with hyperosmolar coma, with long-term current use of insulin (HCC) Assessment & Plan: Refilled Rybelsus 14 mg daily Encouraged to  continue taking Lantus 40 units nightly and Humalog 8-14 units 3 times daily Encouraged to follow-up with endocrinology as scheduled No reports of plan urea, polyphagia, by dyspnea Lab Results  Component Value Date   HGBA1C 6.2 (A) 06/17/2023      Anxiety Assessment & Plan: GAD-7 is 5 No reports of suicidal ideation or thoughts Encouraged to continue taking Prozac 20 mg daily Discussed nonpharmacologic management of anxiety and depression: Mindfulness and Meditation Practices like mindfulness meditation can help reduce symptoms by promoting relaxation and present-moment awareness.  Exercise  Regular physical activity has been shown to improve mood and reduce anxiety through the release of endorphins and other neurochemicals.  Healthy Diet Eating a balanced diet rich in fruits, vegetables, whole grains, and lean proteins can support overall mental health.  Sleep Hygiene  Establishing a regular sleep routine and ensuring good sleep quality can significantly impact mood and anxiety levels.  Stress Management Techniques Activities such as yoga, tai chi, and deep breathing exercises can help manage stress.  Social Support Maintaining strong relationships and seeking support from friends, family, or support groups can provide emotional comfort and reduce feelings of isolation.  Lifestyle Modifications Reducing alcohol and caffeine intake, quitting smoking, and avoiding recreational drugs can improve symptoms.  Art and Music Therapy Engaging in creative activities like painting, drawing, or playing music can be therapeutic and help express emotions.  Light Therapy Particularly useful for seasonal affective disorder (SAD), exposure to bright light can help regulate mood. .     Vitamin D deficiency -     VITAMIN D 25 Hydroxy (Vit-D Deficiency, Fractures)  Other specified hypothyroidism -     TSH + free T4  Hyperlipidemia associated with type 2 diabetes mellitus (HCC) -     Lipid  panel -     CMP14+EGFR -     CBC with Differential/Platelet  Note: This chart has been completed using Engineer, civil (consulting) software, and while attempts have been made to ensure accuracy, certain words and phrases may not be transcribed as intended.    Follow-up: Return in about 4 months (around 11/08/2023).   Gilmore Laroche, FNP

## 2023-07-08 NOTE — Patient Instructions (Addendum)
I appreciate the opportunity to provide care to you today!    Follow up:  4 months  Fasting Labs: please stop by the lab during the week to get your blood drawn (CBC, CMP, TSH, Lipid profile, Vit D)  Please schedule medical annual wellness  Please stop by your local pharmacy and get your Shingles vaccine  Fiber supplement The recommended amount of fiber is 20 to 35 grams a day.  If you can't get enough fiber from food, you can add wheat bran to the foods you do eat. I recommend Benefiber powder 2 teaspoons mixed in 8 ounces of liquid daily   Referrals today- GI  Attached with your AVS, you will find valuable resources for self-education. I highly recommend dedicating some time to thoroughly examine them.   Please continue to a heart-healthy diet and increase your physical activities. Try to exercise for at least five days a week.    It was a pleasure to see you and I look forward to continuing to work together on your health and well-being. Please do not hesitate to call the office if you need care or have questions about your care.  In case of emergency, please visit the Emergency Department for urgent care, or contact our clinic at [phone] to schedule an appointment. We're here to help you!   Have a wonderful day and week. With Gratitude, Gilmore Laroche MSN, FNP-BC

## 2023-07-08 NOTE — Assessment & Plan Note (Signed)
Refilled Rybelsus 14 mg daily Encouraged to continue taking Lantus 40 units nightly and Humalog 8-14 units 3 times daily Encouraged to follow-up with endocrinology as scheduled No reports of plan urea, polyphagia, by dyspnea Lab Results  Component Value Date   HGBA1C 6.2 (A) 06/17/2023

## 2023-07-08 NOTE — Assessment & Plan Note (Addendum)
Reports having loose "explosive watery dark stools for 3 weeks". Noted fecal incontinence when she feels the urge to have a BM No hematochezia, or hematemesis reports Denies symptoms of dizziness, fatigue and lightheadedness Advised to avoid NSAIDs products No hx of IBS  No c/o rectal or abdominal pain' No unintentional wt loss reported Encouraged increasing her intake of fiber supplement Fiber supplement The recommended amount of fiber is 20 to 35 grams a day.  If you can't get enough fiber from food, you can add wheat bran to the foods you do eat. I recommend Benefiber powder 2 teaspoons mixed in 8 ounces of liquid daily

## 2023-07-08 NOTE — Assessment & Plan Note (Addendum)
GAD-7 is 5 No reports of suicidal ideation or thoughts Encouraged to continue taking Prozac 20 mg daily Discussed nonpharmacologic management of anxiety and depression: Mindfulness and Meditation Practices like mindfulness meditation can help reduce symptoms by promoting relaxation and present-moment awareness.  Exercise  Regular physical activity has been shown to improve mood and reduce anxiety through the release of endorphins and other neurochemicals.  Healthy Diet Eating a balanced diet rich in fruits, vegetables, whole grains, and lean proteins can support overall mental health.  Sleep Hygiene  Establishing a regular sleep routine and ensuring good sleep quality can significantly impact mood and anxiety levels.  Stress Management Techniques Activities such as yoga, tai chi, and deep breathing exercises can help manage stress.  Social Support Maintaining strong relationships and seeking support from friends, family, or support groups can provide emotional comfort and reduce feelings of isolation.  Lifestyle Modifications Reducing alcohol and caffeine intake, quitting smoking, and avoiding recreational drugs can improve symptoms.  Art and Music Therapy Engaging in creative activities like painting, drawing, or playing music can be therapeutic and help express emotions.  Light Therapy Particularly useful for seasonal affective disorder (SAD), exposure to bright light can help regulate mood. Marland Kitchen

## 2023-07-09 ENCOUNTER — Other Ambulatory Visit: Payer: Self-pay | Admitting: Family Medicine

## 2023-07-09 DIAGNOSIS — E039 Hypothyroidism, unspecified: Secondary | ICD-10-CM

## 2023-07-09 DIAGNOSIS — E038 Other specified hypothyroidism: Secondary | ICD-10-CM

## 2023-07-09 LAB — CBC WITH DIFFERENTIAL/PLATELET
Basophils Absolute: 0 10*3/uL (ref 0.0–0.2)
Basos: 1 %
EOS (ABSOLUTE): 0.1 10*3/uL (ref 0.0–0.4)
Eos: 3 %
Hematocrit: 35.7 % (ref 34.0–46.6)
Hemoglobin: 11.5 g/dL (ref 11.1–15.9)
Immature Grans (Abs): 0 10*3/uL (ref 0.0–0.1)
Immature Granulocytes: 0 %
Lymphocytes Absolute: 1.5 10*3/uL (ref 0.7–3.1)
Lymphs: 28 %
MCH: 28.3 pg (ref 26.6–33.0)
MCHC: 32.2 g/dL (ref 31.5–35.7)
MCV: 88 fL (ref 79–97)
Monocytes Absolute: 0.6 10*3/uL (ref 0.1–0.9)
Monocytes: 12 %
Neutrophils Absolute: 3 10*3/uL (ref 1.4–7.0)
Neutrophils: 56 %
Platelets: 242 10*3/uL (ref 150–450)
RBC: 4.06 x10E6/uL (ref 3.77–5.28)
RDW: 13.3 % (ref 11.7–15.4)
WBC: 5.3 10*3/uL (ref 3.4–10.8)

## 2023-07-09 LAB — CMP14+EGFR
ALT: 20 IU/L (ref 0–32)
AST: 28 IU/L (ref 0–40)
Albumin: 3.9 g/dL (ref 3.8–4.8)
Alkaline Phosphatase: 34 IU/L — ABNORMAL LOW (ref 44–121)
BUN/Creatinine Ratio: 21 (ref 12–28)
BUN: 16 mg/dL (ref 8–27)
Bilirubin Total: 0.4 mg/dL (ref 0.0–1.2)
CO2: 19 mmol/L — ABNORMAL LOW (ref 20–29)
Calcium: 8.9 mg/dL (ref 8.7–10.3)
Chloride: 102 mmol/L (ref 96–106)
Creatinine, Ser: 0.76 mg/dL (ref 0.57–1.00)
Globulin, Total: 2.2 g/dL (ref 1.5–4.5)
Glucose: 138 mg/dL — ABNORMAL HIGH (ref 70–99)
Potassium: 3.9 mmol/L (ref 3.5–5.2)
Sodium: 138 mmol/L (ref 134–144)
Total Protein: 6.1 g/dL (ref 6.0–8.5)
eGFR: 83 mL/min/{1.73_m2} (ref >59)

## 2023-07-09 LAB — TSH+FREE T4
Free T4: 1.43 ng/dL (ref 0.82–1.77)
TSH: 0.313 u[IU]/mL — ABNORMAL LOW (ref 0.450–4.500)

## 2023-07-09 LAB — LIPID PANEL
Chol/HDL Ratio: 3 ratio (ref 0.0–4.4)
Cholesterol, Total: 127 mg/dL (ref 100–199)
HDL: 42 mg/dL (ref >39)
LDL Chol Calc (NIH): 63 mg/dL (ref 0–99)
Triglycerides: 121 mg/dL (ref 0–149)
VLDL Cholesterol Cal: 22 mg/dL (ref 5–40)

## 2023-07-09 LAB — VITAMIN D 25 HYDROXY (VIT D DEFICIENCY, FRACTURES): Vit D, 25-Hydroxy: 62.6 ng/mL (ref 30.0–100.0)

## 2023-07-09 MED ORDER — LEVOTHYROXINE SODIUM 88 MCG PO TABS: 88.0000 ug | ORAL_TABLET | Freq: Every day | ORAL | 1 refills | Status: DC

## 2023-07-09 NOTE — Progress Notes (Signed)
Kindly encourage the patient to return for labs in 6 week around Aug 26,2024 to reassess her thyroids levels. Her labs indicate subclinical hyperthyroidism. I've decreased her synthroid to 88 mcg and the new prescription is sent to her pharmacy to start taking.   All other labs are stable

## 2023-07-10 ENCOUNTER — Encounter: Payer: Self-pay | Admitting: *Deleted

## 2023-07-10 ENCOUNTER — Other Ambulatory Visit: Payer: Self-pay | Admitting: *Deleted

## 2023-07-10 ENCOUNTER — Encounter: Payer: Self-pay | Admitting: Internal Medicine

## 2023-07-10 ENCOUNTER — Ambulatory Visit (INDEPENDENT_AMBULATORY_CARE_PROVIDER_SITE_OTHER): Payer: 59 | Admitting: Internal Medicine

## 2023-07-10 ENCOUNTER — Ambulatory Visit: Payer: 59 | Admitting: Internal Medicine

## 2023-07-10 VITALS — BP 112/50 | HR 72 | Temp 97.9°F | Ht 66.5 in | Wt 178.3 lb

## 2023-07-10 DIAGNOSIS — K921 Melena: Secondary | ICD-10-CM

## 2023-07-10 DIAGNOSIS — R630 Anorexia: Secondary | ICD-10-CM

## 2023-07-10 DIAGNOSIS — R197 Diarrhea, unspecified: Secondary | ICD-10-CM | POA: Diagnosis not present

## 2023-07-10 MED ORDER — NA SULFATE-K SULFATE-MG SULF 17.5-3.13-1.6 GM/177ML PO SOLN: ORAL | 0 refills | Status: DC

## 2023-07-10 NOTE — H&P (View-Only) (Signed)
Primary Care Physician:  Gilmore Laroche, FNP Primary Gastroenterologist:  Dr. Marletta Lor  Chief Complaint  Patient presents with   Melena    Referred for dark stools. Started several months ago. Having a lot of dizziness.     HPI:   [name] is a 72 y.o. female who presents to the clinic today by referral from her PCP Gilmore Laroche.  Patient states that she has had black stools for the last 3 months.  Notes 2-3 bowel movements a day.  Always loose.  States she historically has had chronic diarrhea though change color approximately 3 months ago.  Denies taking any iron supplementation.  No Pepto-Bismol.  Has tried Imodium which does help if she has a "explosion."  Symptoms are moderate, constant.  Denies any NSAID use.  No history of peptic ulcer disease.  No previous upper endoscopy.  States her last colonoscopy was greater than 10 years ago.  Denies any family history of colon cancer.  States she has been using Cologuard which have historically been negative.  Also notes decreased appetite.  Denies any dysphagia odynophagia.  No epigastric or chest pain.  Had reflux symptoms years ago previously maintained on Tums.  Does not currently take anything for chronic acid suppression.  Patient's granddaughter currently pregnant and expecting baby girl in 1 to 2 weeks.  Patient is excited in this regard.  Past Medical History:  Diagnosis Date   Anemia    as a teenager   Anxiety    Arthritis    knees, rt hand,hips   Cancer (HCC)    vaginal,uterine,ovariam   COPD (chronic obstructive pulmonary disease) (HCC)    Depression    Diabetes mellitus without complication (HCC)    Hypothyroidism    Neuromuscular disorder (HCC)    hands and feet    Past Surgical History:  Procedure Laterality Date   ABDOMINAL HYSTERECTOMY     BREAST SURGERY Right 1988   lumpectomy   EYE SURGERY Bilateral    FRACTURE SURGERY     Rt ankle,lt knee cap,lt shoulder,rt wrist,rt shoulder,    TONSILLECTOMY     at age 35   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   Procedure: TOTAL KNEE ARTHROPLASTY;  Surgeon: Frederico Hamman, MD;  Location: [facility];  Service: Orthopedics;  Laterality: Right;    Current Outpatient Medications  Medication Sig Dispense Refill   acyclovir (ZOVIRAX) 400 MG tablet TAKE 1 TABLET BY MOUTH EVERY TWELVE HOURS (AT BREAKFAST AND AT BEDTIME) 90 tablet 1   alendronate (FOSAMAX) 70 MG tablet Take 1 tablet (70 mg total) by mouth every 7 (seven) days. Take with a full glass of water on an empty stomach. 4 tablet 11   Apple Cider Vinegar 300 MG TABS Take by mouth.     aspirin 81 MG chewable tablet Chew 1 tablet (81 mg total) by mouth 2 (two) times daily. 60 tablet 0   atorvastatin (LIPITOR) 20 MG tablet TAKE 1 TABLET BY MOUTH AT BEDTIME 90 tablet 1   Blood Pressure Monitoring (BLOOD PRESSURE MONITOR/L CUFF) MISC Dx: R03.0, elevated bp in office 1 each 0   cholecalciferol (VITAMIN D3) 25 MCG (1000 UNIT) tablet Take 1,000 Units by mouth daily.     clonazePAM (KLONOPIN) 0.5 MG tablet TAKE 1 TABLET BY MOUTH AT BEDTIME 30 tablet 1   Continuous Glucose Sensor (DEXCOM G7 SENSOR) MISC Use to test BG 4+ times daily. Change sensor every 10 days. E11.65 9 each 4   donepezil (ARICEPT) 10 MG  tablet TAKE 1 TABLET BY MOUTH EVERY DAY 90 tablet 1   EPINEPHrine 0.3 mg/0.3 mL IJ SOAJ injection Inject 0.3 mg into the muscle as needed for anaphylaxis. 1 each 2   escitalopram (LEXAPRO) 20 MG tablet TAKE 1 TABLET BY MOUTH EVERY DAY 30 tablet 2   glucose blood (TRUE METRIX BLOOD GLUCOSE TEST) test strip USE AS DIRECTED 100 each 2   hydrocortisone (ANUSOL-HC) 2.5 % rectal cream Place 1 Application rectally 2 (two) times daily. 30 g 0   insulin glargine (LANTUS) 100 UNIT/ML Solostar Pen Inject 40 Units into the skin at bedtime. 36 mL 3   insulin lispro (HUMALOG) 100 UNIT/ML KwikPen Inject 8-14 Units into the skin 3 (three) times daily. 36 mL 3   Insulin Pen Needle (BD AUTOSHIELD DUO) 30G X 5  MM MISC Use 4 times daily as instructed 100 each 2   levothyroxine (SYNTHROID) 88 MCG tablet Take 1 tablet (88 mcg total) by mouth daily. 90 tablet 1   loperamide (IMODIUM) 2 MG capsule Take 2 mg by mouth See admin instructions. Take 2 mg PO after each loose stool as needed for diarrhea (max of 3 doses in 24 hours)     methocarbamol (ROBAXIN) 500 MG tablet Take 1 tablet (500 mg total) by mouth 2 (two) times daily. 30 tablet 0   metoCLOPramide (REGLAN) 5 MG tablet TAKE 1 TABLET BY MOUTH FOUR TIMES DAILY 30 tablet 0   nystatin cream (MYCOSTATIN) apply TO SKIN folds, UNDER breasts, abdominal folds, AND inguinal folds TWICE DAILY FOR candidasis 30 g 1   ondansetron (ZOFRAN) 4 MG tablet Take 1 tablet (4 mg total) by mouth every 6 (six) hours as needed. 30 tablet 2   polyethylene glycol powder (GLYCOLAX/MIRALAX) 17 GM/SCOOP powder SMARTSIG:1 Capful(s) By Mouth Daily     prazosin (MINIPRESS) 2 MG capsule TAKE ONE CAPSULE BY MOUTH AT BEDTIME 90 capsule 1   predniSONE (DELTASONE) 20 MG tablet Take 1 tablet (20 mg total) by mouth 2 (two) times daily with a meal. 10 tablet 0   Semaglutide (RYBELSUS) 14 MG TABS Take 1 tablet (14 mg total) by mouth daily. 90 tablet 3   senna (SENOKOT) 8.6 MG tablet Take 1 tablet (8.6 mg total) by mouth daily. 60 tablet 1   Specialty Vitamins Products (COLLAGEN ULTRA PO) Take by mouth.     Tetrahydrozoline-Zn Sulfate (EYE DROPS AR OP) Apply to eye. As needed     traZODone (DESYREL) 50 MG tablet TAKE 2 TABLETS BY MOUTH AT BEDTIME 30 tablet 2   trospium (SANCTURA) 20 MG tablet Take 1 tablet (20 mg total) by mouth 2 (two) times daily. 60 tablet 11   Vibegron (GEMTESA) 75 MG TABS Take 75 mg by mouth daily. 28 tablet 0   vitamin B-12 (CYANOCOBALAMIN) 1000 MCG tablet Take 1,000 mcg by mouth daily.     Vitamin D, Ergocalciferol, (DRISDOL) 1.25 MG (50000 UNIT) CAPS capsule TAKE ONE CAPSULE BY MOUTH ONCE WEEKLY ON TUESDAY 10 capsule 1   No current facility-administered medications for  this visit.    Allergies as of 07/10/2023 - Review Complete 07/10/2023  Allergen Reaction Noted   Ambien [zolpidem tartrate] Other (See Comments) 09/01/2019   Bee venom Swelling 05/22/2022   Coconut flavor Swelling 09/01/2019   Codeine Nausea And Vomiting 09/01/2019   Onion Nausea Only and Other (See Comments) 09/01/2019   Other  09/01/2019   Sulfa antibiotics Rash and Other (See Comments) 09/01/2019    Family History  Problem Relation Age of Onset  Cancer Mother    Hypertension Mother    Thyroid disease Mother    Diabetes Mother    Stroke Mother    Heart failure Mother    Diabetes Father    Heart attack Father    Diabetes Sister    Diabetes Brother     Social History   Socioeconomic History   Marital status: Married    Spouse name: Not on file   Number of children: Not on file   Years of education: Not on file   Highest education level: Not on file  Occupational History   Not on file  Tobacco Use   Smoking status: Former    Current packs/day: 0.00    Average packs/day: 0.5 packs/day for 15.0 years (7.5 ttl pk-yrs)    Types: Cigarettes    Start date: 38    Quit date: 2013    Years since quitting: 11.5   Smokeless tobacco: Never  Vaping Use   Vaping status: Never Used  Substance and Sexual Activity   Alcohol use: Never   Drug use: Never   Sexual activity: Not on file  Other Topics Concern   Not on file  Social History Narrative   Not on file   Social Determinants of Health   Financial Resource Strain: Not on file  Food Insecurity: Not on file  Transportation Needs: Not on file  Physical Activity: Not on file  Stress: Not on file  Social Connections: Not on file  Intimate Partner Violence: Not on file    Subjective: Review of Systems  Constitutional:  Negative for chills and fever.  HENT:  Negative for congestion and hearing loss.   Eyes:  Negative for blurred vision and double vision.  Respiratory:  Negative for cough and shortness of  breath.   Cardiovascular:  Negative for chest pain and palpitations.  Gastrointestinal:  Positive for diarrhea and melena. Negative for abdominal pain, blood in stool, constipation, heartburn and vomiting.  Genitourinary:  Negative for dysuria and urgency.  Musculoskeletal:  Negative for joint pain and myalgias.  Skin:  Negative for itching and rash.  Neurological:  Negative for dizziness and headaches.  Psychiatric/Behavioral:  Negative for depression. The patient is not nervous/anxious.        Objective: BP (!) 112/50 (BP Location: Right Arm, Patient Position: Sitting, Cuff Size: Large)   Pulse 72   Temp 97.9 F (36.6 C) (Oral)   Ht 5' 6.5" (1.689 m)   Wt 178 lb 4.8 oz (80.9 kg)   BMI 28.35 kg/m  Physical Exam Constitutional:      Appearance: Normal appearance.  HENT:     Head: Normocephalic and atraumatic.  Eyes:     Extraocular Movements: Extraocular movements intact.     Conjunctiva/sclera: Conjunctivae normal.  Cardiovascular:     Rate and Rhythm: Normal rate and regular rhythm.  Pulmonary:     Effort: Pulmonary effort is normal.     Breath sounds: Normal breath sounds.  Abdominal:     General: Bowel sounds are normal.     Palpations: Abdomen is soft.  Musculoskeletal:        General: No swelling. Normal range of motion.     Cervical back: Normal range of motion and neck supple.  Skin:    General: Skin is warm and dry.     Coloration: Skin is not jaundiced.  Neurological:     General: No focal deficit present.     Mental Status: She is alert and oriented to person, place,  and time.  Psychiatric:        Mood and Affect: Mood normal.        Behavior: Behavior normal.      Assessment: *Chronic diarrhea *Melena *Poor appetite  Plan: Etiology of patient's symptoms clear. Will schedule for EGD to evaluate for peptic ulcer disease, esophagitis, gastritis, H. Pylori, duodenitis, or other. Will also evaluate for esophageal stricture, Schatzki's ring, esophageal  web or other.   At the same time we will perform colonoscopy with random colon biopsies.  The risks including infection, bleed, or perforation as well as benefits, limitations, alternatives and imponderables have been reviewed with the patient. Potential for esophageal dilation, biopsy, etc. have also been reviewed.  Questions have been answered. All parties agreeable.  Continue Imodium as needed.  Will check iron levels and screen for celiac disease today.  Thank you Gilmore Laroche for the kind referral.    07/10/2023 9:36 AM   Disclaimer: This note was dictated with voice recognition software. Similar sounding words can inadvertently be transcribed and may not be corrected upon review.

## 2023-07-10 NOTE — Progress Notes (Signed)
Primary Care Physician:  Gilmore Laroche, FNP Primary Gastroenterologist:  Dr. Marletta Lor  Chief Complaint  Patient presents with   Melena    Referred for dark stools. Started several months ago. Having a lot of dizziness.     HPI:   [name] is a 72 y.o. female who presents to the clinic today by referral from her PCP Gilmore Laroche.  Patient states that she has had black stools for the last 3 months.  Notes 2-3 bowel movements a day.  Always loose.  States she historically has had chronic diarrhea though change color approximately 3 months ago.  Denies taking any iron supplementation.  No Pepto-Bismol.  Has tried Imodium which does help if she has a "explosion."  Symptoms are moderate, constant.  Denies any NSAID use.  No history of peptic ulcer disease.  No previous upper endoscopy.  States her last colonoscopy was greater than 10 years ago.  Denies any family history of colon cancer.  States she has been using Cologuard which have historically been negative.  Also notes decreased appetite.  Denies any dysphagia odynophagia.  No epigastric or chest pain.  Had reflux symptoms years ago previously maintained on Tums.  Does not currently take anything for chronic acid suppression.  Patient's granddaughter currently pregnant and expecting baby girl in 1 to 2 weeks.  Patient is excited in this regard.  Past Medical History:  Diagnosis Date   Anemia    as a teenager   Anxiety    Arthritis    knees, rt hand,hips   Cancer (HCC)    vaginal,uterine,ovariam   COPD (chronic obstructive pulmonary disease) (HCC)    Depression    Diabetes mellitus without complication (HCC)    Hypothyroidism    Neuromuscular disorder (HCC)    hands and feet    Past Surgical History:  Procedure Laterality Date   ABDOMINAL HYSTERECTOMY     BREAST SURGERY Right 1988   lumpectomy   EYE SURGERY Bilateral    FRACTURE SURGERY     Rt ankle,lt knee cap,lt shoulder,rt wrist,rt shoulder,    TONSILLECTOMY     at age 35   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   Procedure: TOTAL KNEE ARTHROPLASTY;  Surgeon: Frederico Hamman, MD;  Location: [facility];  Service: Orthopedics;  Laterality: Right;    Current Outpatient Medications  Medication Sig Dispense Refill   acyclovir (ZOVIRAX) 400 MG tablet TAKE 1 TABLET BY MOUTH EVERY TWELVE HOURS (AT BREAKFAST AND AT BEDTIME) 90 tablet 1   alendronate (FOSAMAX) 70 MG tablet Take 1 tablet (70 mg total) by mouth every 7 (seven) days. Take with a full glass of water on an empty stomach. 4 tablet 11   Apple Cider Vinegar 300 MG TABS Take by mouth.     aspirin 81 MG chewable tablet Chew 1 tablet (81 mg total) by mouth 2 (two) times daily. 60 tablet 0   atorvastatin (LIPITOR) 20 MG tablet TAKE 1 TABLET BY MOUTH AT BEDTIME 90 tablet 1   Blood Pressure Monitoring (BLOOD PRESSURE MONITOR/L CUFF) MISC Dx: R03.0, elevated bp in office 1 each 0   cholecalciferol (VITAMIN D3) 25 MCG (1000 UNIT) tablet Take 1,000 Units by mouth daily.     clonazePAM (KLONOPIN) 0.5 MG tablet TAKE 1 TABLET BY MOUTH AT BEDTIME 30 tablet 1   Continuous Glucose Sensor (DEXCOM G7 SENSOR) MISC Use to test BG 4+ times daily. Change sensor every 10 days. E11.65 9 each 4   donepezil (ARICEPT) 10 MG  tablet TAKE 1 TABLET BY MOUTH EVERY DAY 90 tablet 1   EPINEPHrine 0.3 mg/0.3 mL IJ SOAJ injection Inject 0.3 mg into the muscle as needed for anaphylaxis. 1 each 2   escitalopram (LEXAPRO) 20 MG tablet TAKE 1 TABLET BY MOUTH EVERY DAY 30 tablet 2   glucose blood (TRUE METRIX BLOOD GLUCOSE TEST) test strip USE AS DIRECTED 100 each 2   hydrocortisone (ANUSOL-HC) 2.5 % rectal cream Place 1 Application rectally 2 (two) times daily. 30 g 0   insulin glargine (LANTUS) 100 UNIT/ML Solostar Pen Inject 40 Units into the skin at bedtime. 36 mL 3   insulin lispro (HUMALOG) 100 UNIT/ML KwikPen Inject 8-14 Units into the skin 3 (three) times daily. 36 mL 3   Insulin Pen Needle (BD AUTOSHIELD DUO) 30G X 5  MM MISC Use 4 times daily as instructed 100 each 2   levothyroxine (SYNTHROID) 88 MCG tablet Take 1 tablet (88 mcg total) by mouth daily. 90 tablet 1   loperamide (IMODIUM) 2 MG capsule Take 2 mg by mouth See admin instructions. Take 2 mg PO after each loose stool as needed for diarrhea (max of 3 doses in 24 hours)     methocarbamol (ROBAXIN) 500 MG tablet Take 1 tablet (500 mg total) by mouth 2 (two) times daily. 30 tablet 0   metoCLOPramide (REGLAN) 5 MG tablet TAKE 1 TABLET BY MOUTH FOUR TIMES DAILY 30 tablet 0   nystatin cream (MYCOSTATIN) apply TO SKIN folds, UNDER breasts, abdominal folds, AND inguinal folds TWICE DAILY FOR candidasis 30 g 1   ondansetron (ZOFRAN) 4 MG tablet Take 1 tablet (4 mg total) by mouth every 6 (six) hours as needed. 30 tablet 2   polyethylene glycol powder (GLYCOLAX/MIRALAX) 17 GM/SCOOP powder SMARTSIG:1 Capful(s) By Mouth Daily     prazosin (MINIPRESS) 2 MG capsule TAKE ONE CAPSULE BY MOUTH AT BEDTIME 90 capsule 1   predniSONE (DELTASONE) 20 MG tablet Take 1 tablet (20 mg total) by mouth 2 (two) times daily with a meal. 10 tablet 0   Semaglutide (RYBELSUS) 14 MG TABS Take 1 tablet (14 mg total) by mouth daily. 90 tablet 3   senna (SENOKOT) 8.6 MG tablet Take 1 tablet (8.6 mg total) by mouth daily. 60 tablet 1   Specialty Vitamins Products (COLLAGEN ULTRA PO) Take by mouth.     Tetrahydrozoline-Zn Sulfate (EYE DROPS AR OP) Apply to eye. As needed     traZODone (DESYREL) 50 MG tablet TAKE 2 TABLETS BY MOUTH AT BEDTIME 30 tablet 2   trospium (SANCTURA) 20 MG tablet Take 1 tablet (20 mg total) by mouth 2 (two) times daily. 60 tablet 11   Vibegron (GEMTESA) 75 MG TABS Take 75 mg by mouth daily. 28 tablet 0   vitamin B-12 (CYANOCOBALAMIN) 1000 MCG tablet Take 1,000 mcg by mouth daily.     Vitamin D, Ergocalciferol, (DRISDOL) 1.25 MG (50000 UNIT) CAPS capsule TAKE ONE CAPSULE BY MOUTH ONCE WEEKLY ON TUESDAY 10 capsule 1   No current facility-administered medications for  this visit.    Allergies as of 07/10/2023 - Review Complete 07/10/2023  Allergen Reaction Noted   Ambien [zolpidem tartrate] Other (See Comments) 09/01/2019   Bee venom Swelling 05/22/2022   Coconut flavor Swelling 09/01/2019   Codeine Nausea And Vomiting 09/01/2019   Onion Nausea Only and Other (See Comments) 09/01/2019   Other  09/01/2019   Sulfa antibiotics Rash and Other (See Comments) 09/01/2019    Family History  Problem Relation Age of Onset  Cancer Mother    Hypertension Mother    Thyroid disease Mother    Diabetes Mother    Stroke Mother    Heart failure Mother    Diabetes Father    Heart attack Father    Diabetes Sister    Diabetes Brother     Social History   Socioeconomic History   Marital status: Married    Spouse name: Not on file   Number of children: Not on file   Years of education: Not on file   Highest education level: Not on file  Occupational History   Not on file  Tobacco Use   Smoking status: Former    Current packs/day: 0.00    Average packs/day: 0.5 packs/day for 15.0 years (7.5 ttl pk-yrs)    Types: Cigarettes    Start date: 38    Quit date: 2013    Years since quitting: 11.5   Smokeless tobacco: Never  Vaping Use   Vaping status: Never Used  Substance and Sexual Activity   Alcohol use: Never   Drug use: Never   Sexual activity: Not on file  Other Topics Concern   Not on file  Social History Narrative   Not on file   Social Determinants of Health   Financial Resource Strain: Not on file  Food Insecurity: Not on file  Transportation Needs: Not on file  Physical Activity: Not on file  Stress: Not on file  Social Connections: Not on file  Intimate Partner Violence: Not on file    Subjective: Review of Systems  Constitutional:  Negative for chills and fever.  HENT:  Negative for congestion and hearing loss.   Eyes:  Negative for blurred vision and double vision.  Respiratory:  Negative for cough and shortness of  breath.   Cardiovascular:  Negative for chest pain and palpitations.  Gastrointestinal:  Positive for diarrhea and melena. Negative for abdominal pain, blood in stool, constipation, heartburn and vomiting.  Genitourinary:  Negative for dysuria and urgency.  Musculoskeletal:  Negative for joint pain and myalgias.  Skin:  Negative for itching and rash.  Neurological:  Negative for dizziness and headaches.  Psychiatric/Behavioral:  Negative for depression. The patient is not nervous/anxious.        Objective: BP (!) 112/50 (BP Location: Right Arm, Patient Position: Sitting, Cuff Size: Large)   Pulse 72   Temp 97.9 F (36.6 C) (Oral)   Ht 5' 6.5" (1.689 m)   Wt 178 lb 4.8 oz (80.9 kg)   BMI 28.35 kg/m  Physical Exam Constitutional:      Appearance: Normal appearance.  HENT:     Head: Normocephalic and atraumatic.  Eyes:     Extraocular Movements: Extraocular movements intact.     Conjunctiva/sclera: Conjunctivae normal.  Cardiovascular:     Rate and Rhythm: Normal rate and regular rhythm.  Pulmonary:     Effort: Pulmonary effort is normal.     Breath sounds: Normal breath sounds.  Abdominal:     General: Bowel sounds are normal.     Palpations: Abdomen is soft.  Musculoskeletal:        General: No swelling. Normal range of motion.     Cervical back: Normal range of motion and neck supple.  Skin:    General: Skin is warm and dry.     Coloration: Skin is not jaundiced.  Neurological:     General: No focal deficit present.     Mental Status: She is alert and oriented to person, place,  and time.  Psychiatric:        Mood and Affect: Mood normal.        Behavior: Behavior normal.      Assessment: *Chronic diarrhea *Melena *Poor appetite  Plan: Etiology of patient's symptoms clear. Will schedule for EGD to evaluate for peptic ulcer disease, esophagitis, gastritis, H. Pylori, duodenitis, or other. Will also evaluate for esophageal stricture, Schatzki's ring, esophageal  web or other.   At the same time we will perform colonoscopy with random colon biopsies.  The risks including infection, bleed, or perforation as well as benefits, limitations, alternatives and imponderables have been reviewed with the patient. Potential for esophageal dilation, biopsy, etc. have also been reviewed.  Questions have been answered. All parties agreeable.  Continue Imodium as needed.  Will check iron levels and screen for celiac disease today.  Thank you Gilmore Laroche for the kind referral.    07/10/2023 9:36 AM   Disclaimer: This note was dictated with voice recognition software. Similar sounding words can inadvertently be transcribed and may not be corrected upon review.

## 2023-07-10 NOTE — Patient Instructions (Signed)
I am going to check blood work today at [facility].  I am going to screen you for celiac disease, and check your iron levels.  We will schedule you for upper endoscopy and colonoscopy to further evaluate your symptoms.  Continue Imodium as needed for now.  It was very nice to meet you today.  Congratulations on the new great grandbaby.  Dr. Marletta Lor

## 2023-07-11 LAB — CELIAC DISEASE PANEL
(tTG) Ab, IgA: <1 U/mL
(tTG) Ab, IgG: 1.1 U/mL
Gliadin IgA: <1 U/mL
Gliadin IgG: <1 U/mL
Immunoglobulin A: 162 mg/dL (ref 70–320)

## 2023-07-11 LAB — IRON,TIBC AND FERRITIN PANEL
%SAT: 12 % (calc) — ABNORMAL LOW (ref 16–45)
Ferritin: 21 ng/mL (ref 16–288)
Iron: 42 ug/dL — ABNORMAL LOW (ref 45–160)
TIBC: 361 mcg/dL (calc) (ref 250–450)

## 2023-07-15 ENCOUNTER — Encounter: Payer: Self-pay | Admitting: Internal Medicine

## 2023-07-18 ENCOUNTER — Other Ambulatory Visit: Payer: Self-pay | Admitting: Family Medicine

## 2023-07-18 DIAGNOSIS — E1100 Type 2 diabetes mellitus with hyperosmolarity without nonketotic hyperglycemic-hyperosmolar coma (NKHHC): Secondary | ICD-10-CM

## 2023-07-18 DIAGNOSIS — G4709 Other insomnia: Secondary | ICD-10-CM

## 2023-07-18 DIAGNOSIS — B379 Candidiasis, unspecified: Secondary | ICD-10-CM

## 2023-07-19 ENCOUNTER — Ambulatory Visit: Payer: 59 | Admitting: Gastroenterology

## 2023-07-25 ENCOUNTER — Ambulatory Visit (INDEPENDENT_AMBULATORY_CARE_PROVIDER_SITE_OTHER): Payer: 59

## 2023-07-25 ENCOUNTER — Telehealth: Payer: Self-pay

## 2023-07-25 ENCOUNTER — Other Ambulatory Visit: Payer: Self-pay | Admitting: Family Medicine

## 2023-07-25 VITALS — Ht 65.5 in | Wt 174.0 lb

## 2023-07-25 DIAGNOSIS — Z0100 Encounter for examination of eyes and vision without abnormal findings: Secondary | ICD-10-CM

## 2023-07-25 DIAGNOSIS — Z0000 Encounter for general adult medical examination without abnormal findings: Secondary | ICD-10-CM

## 2023-07-25 DIAGNOSIS — Z1231 Encounter for screening mammogram for malignant neoplasm of breast: Secondary | ICD-10-CM

## 2023-07-25 DIAGNOSIS — E119 Type 2 diabetes mellitus without complications: Secondary | ICD-10-CM

## 2023-07-25 NOTE — Telephone Encounter (Signed)
Patient scored an 11 on the PHQ 9 during her AWV. She would like a referral to behavioral health in [place] for treatment. She understands she may need to be seen prior to referral being placed.   Thank you Abby Brittyn Salaz, CMA  [facility] AWV Team Direct Dial: [phone]

## 2023-07-25 NOTE — Patient Instructions (Signed)
[name] , Thank you for taking time to come for your Medicare Wellness Visit. I appreciate your ongoing commitment to your health goals. Please review the following plan we discussed and let me know if I can assist you in the future.   These are the goals we discussed:  Goals       patient (pt-stated)      Goals for this year are to decrease back pain, not have black stools, and lose weight      Patient Stated      Patient states that her current goal is to stop having to use the restroom all the time.        This is a list of the screening recommended for you and due dates:  Health Maintenance  Topic Date Due   Zoster (Shingles) Vaccine (2 of 2) 07/17/2022   COVID-19 Vaccine (1 - 2023-24 season) Never done   Flu Shot  07/25/2023   Pneumonia Vaccine (2 of 2 - PCV) 08/23/2023   Complete foot exam   08/23/2023   Hemoglobin A1C  12/17/2023   Yearly kidney health urinalysis for diabetes  02/22/2024   Eye exam for diabetics  05/21/2024   Yearly kidney function blood test for diabetes  07/07/2024   Medicare Annual Wellness Visit  07/24/2024   Mammogram  09/28/2024   Cologuard (Stool DNA test)  06/29/2025   DTaP/Tdap/Td vaccine (3 - Td or Tdap) 05/22/2032   DEXA scan (bone density measurement)  Completed   Hepatitis C Screening  Completed   HPV Vaccine  Aged Out    Advanced directives: Advance directive discussed with you today. Even though you declined this today, please call our office should you change your mind, and we can give you the proper paperwork for you to fill out. Advance care planning is a way to make decisions about medical care that fits your values in case you are ever unable to make these decisions for yourself.  Information on Advanced Care Planning can be found at [facility] Advance Health Care Directives Advance Health Care Directives ([email or web address]    Conditions/risks identified:  You have an order for:  []   2D Mammogram  [x]   3D  Mammogram  []   Bone Density   []   Lung Cancer Screening  Please call for appointment:   [facility] [place] [address] [place], [place] [address] [phone]  Make sure to wear two-piece clothing.  No lotions powders or deodorants the day of the appointment Make sure to bring picture ID and insurance card.  Bring list of medications you are currently taking including any supplements.   Schedule your [place] screening mammogram through MyChart!   Log into your MyChart account.  Go to 'Visit' (or 'Appointments' if on mobile App) --> Schedule an Appointment  Under 'Select a Reason for Visit' choose the Mammogram Screening option.  Complete the pre-visit questions and select the time and place that best fits your schedule.  A message will be sent to your provider regarding a referral to speak to someone about depression and treatment   Next appointment: VIRTUAL/TELEPHONE APPOINTMENT Follow up in one year for your annual wellness visit September 16, 2024 at 9:00am telephone visit   Preventive Care 65 Years and Older, Female Preventive care refers to lifestyle choices and visits with your health care provider that can promote health and wellness. What does preventive care include? A yearly physical exam. This is also called  an annual well check. Dental exams once or twice a year. Routine eye exams. Ask your health care provider how often you should have your eyes checked. Personal lifestyle choices, including: Daily care of your teeth and gums. Regular physical activity. Eating a healthy diet. Avoiding tobacco and drug use. Limiting alcohol use. Practicing safe sex. Taking low-dose aspirin every day. Taking vitamin and mineral supplements as recommended by your health care provider. What happens during an annual well check? The services and screenings done by your health care provider during your annual well check will depend on your  age, overall health, lifestyle risk factors, and family history of disease. Counseling  Your health care provider may ask you questions about your: Alcohol use. Tobacco use. Drug use. Emotional well-being. Home and relationship well-being. Sexual activity. Eating habits. History of falls. Memory and ability to understand (cognition). Work and work Astronomer. Reproductive health. Screening  You may have the following tests or measurements: Height, weight, and BMI. Blood pressure. Lipid and cholesterol levels. These may be checked every 5 years, or more frequently if you are over 81 years old. Skin check. Lung cancer screening. You may have this screening every year starting at age 61 if you have a 30-pack-year history of smoking and currently smoke or have quit within the past 15 years. Fecal occult blood test (FOBT) of the stool. You may have this test every year starting at age 37. Flexible sigmoidoscopy or colonoscopy. You may have a sigmoidoscopy every 5 years or a colonoscopy every 10 years starting at age 86. Hepatitis C blood test. Hepatitis B blood test. Sexually transmitted disease (STD) testing. Diabetes screening. This is done by checking your blood sugar (glucose) after you have not eaten for a while (fasting). You may have this done every 1-3 years. Bone density scan. This is done to screen for osteoporosis. You may have this done starting at age 74. Mammogram. This may be done every 1-2 years. Talk to your health care provider about how often you should have regular mammograms. Talk with your health care provider about your test results, treatment options, and if necessary, the need for more tests. Vaccines  Your health care provider may recommend certain vaccines, such as: Influenza vaccine. This is recommended every year. Tetanus, diphtheria, and acellular pertussis (Tdap, Td) vaccine. You may need a Td booster every 10 years. Zoster vaccine. You may need this after  age 39. Pneumococcal 13-valent conjugate (PCV13) vaccine. One dose is recommended after age 80. Pneumococcal polysaccharide (PPSV23) vaccine. One dose is recommended after age 58. Talk to your health care provider about which screenings and vaccines you need and how often you need them. This information is not intended to replace advice given to you by your health care provider. Make sure you discuss any questions you have with your health care provider. Document Released: 01/06/2016 Document Revised: 08/29/2016 Document Reviewed: 10/11/2015 Elsevier Interactive Patient Education  2017 [employer].  Fall Prevention in the Home Falls can cause injuries. They can happen to people of all ages. There are many things you can do to make your home safe and to help prevent falls. What can I do on the outside of my home? Regularly fix the edges of walkways and driveways and fix any cracks. Remove anything that might make you trip as you walk through a door, such as a raised step or threshold. Trim any bushes or trees on the path to your home. Use bright outdoor lighting. Clear any walking paths of  anything that might make someone trip, such as rocks or tools. Regularly check to see if handrails are loose or broken. Make sure that both sides of any steps have handrails. Any raised decks and porches should have guardrails on the edges. Have any leaves, snow, or ice cleared regularly. Use sand or salt on walking paths during winter. Clean up any spills in your garage right away. This includes oil or grease spills. What can I do in the bathroom? Use night lights. Install grab bars by the toilet and in the tub and shower. Do not use towel bars as grab bars. Use non-skid mats or decals in the tub or shower. If you need to sit down in the shower, use a plastic, non-slip stool. Keep the floor dry. Clean up any water that spills on the floor as soon as it happens. Remove soap buildup in the tub or shower  regularly. Attach bath mats securely with double-sided non-slip rug tape. Do not have throw rugs and other things on the floor that can make you trip. What can I do in the bedroom? Use night lights. Make sure that you have a light by your bed that is easy to reach. Do not use any sheets or blankets that are too big for your bed. They should not hang down onto the floor. Have a firm chair that has side arms. You can use this for support while you get dressed. Do not have throw rugs and other things on the floor that can make you trip. What can I do in the kitchen? Clean up any spills right away. Avoid walking on wet floors. Keep items that you use a lot in easy-to-reach places. If you need to reach something above you, use a strong step stool that has a grab bar. Keep electrical cords out of the way. Do not use floor polish or wax that makes floors slippery. If you must use wax, use non-skid floor wax. Do not have throw rugs and other things on the floor that can make you trip. What can I do with my stairs? Do not leave any items on the stairs. Make sure that there are handrails on both sides of the stairs and use them. Fix handrails that are broken or loose. Make sure that handrails are as long as the stairways. Check any carpeting to make sure that it is firmly attached to the stairs. Fix any carpet that is loose or worn. Avoid having throw rugs at the top or bottom of the stairs. If you do have throw rugs, attach them to the floor with carpet tape. Make sure that you have a light switch at the top of the stairs and the bottom of the stairs. If you do not have them, ask someone to add them for you. What else can I do to help prevent falls? Wear shoes that: Do not have high heels. Have rubber bottoms. Are comfortable and fit you well. Are closed at the toe. Do not wear sandals. If you use a stepladder: Make sure that it is fully opened. Do not climb a closed stepladder. Make sure that  both sides of the stepladder are locked into place. Ask someone to hold it for you, if possible. Clearly mark and make sure that you can see: Any grab bars or handrails. First and last steps. Where the edge of each step is. Use tools that help you move around (mobility aids) if they are needed. These include: Canes. Walkers. Scooters. Crutches. Turn on  the lights when you go into a dark area. Replace any light bulbs as soon as they burn out. Set up your furniture so you have a clear path. Avoid moving your furniture around. If any of your floors are uneven, fix them. If there are any pets around you, be aware of where they are. Review your medicines with your doctor. Some medicines can make you feel dizzy. This can increase your chance of falling. Ask your doctor what other things that you can do to help prevent falls. This information is not intended to replace advice given to you by your health care provider. Make sure you discuss any questions you have with your health care provider. Document Released: 10/06/2009 Document Revised: 05/17/2016 Document Reviewed: 01/14/2015 Elsevier Interactive Patient Education  2017 [employer].

## 2023-07-25 NOTE — Progress Notes (Signed)
Because this visit was a virtual/telehealth visit,  certain criteria was not obtained, such a blood pressure, CBG if patient is a diabetic, and timed up and go. Any medications not marked as "taking" was not mentioned during the medication reconciliation part of the visit. Any vitals not documented were not able to be obtained due to this being a telehealth visit. Vitals documented are verbally provided by the patient.  Per patient no change in vitals since last visit, unable to obtain new vitals due to telehealth visit.   Subjective:   [name] is a 72 y.o. female who presents for Medicare Annual (Subsequent) preventive examination.  Visit Complete: Virtual  I connected with  [name] on 07/25/23 by a audio enabled telemedicine application and verified that I am speaking with the correct person using two identifiers.  Patient Location: Home  Provider Location: Home Office  I discussed the limitations of evaluation and management by telemedicine. The patient expressed understanding and agreed to proceed.  Patient Medicare AWV questionnaire was completed by the patient on n/a; I have confirmed that all information answered by patient is correct and no changes since this date.  Review of Systems     Cardiac Risk Factors include: advanced age (>18men, >82 women);diabetes mellitus;dyslipidemia;hypertension;sedentary lifestyle     Objective:    Today's Vitals   07/25/23 1000 07/25/23 1003  Weight: 174 lb (78.9 kg)   Height: 5' 5.5" (1.664 m)   PainSc:  5    Body mass index is 28.51 kg/m.     07/25/2023   10:00 AM 06/18/2022    4:56 PM 12/02/2021    4:06 PM 04/25/2021   12:16 PM 09/11/2019    3:00 PM 09/03/2019    3:11 PM  Advanced Directives  Does Patient Have a Medical Advance Directive? No Yes No No No No  Type of Special educational needs teacher of [place];Living will      Copy of Healthcare Power of Attorney in Chart?  No - copy requested      Would  patient like information on creating a medical advance directive? No - Patient declined  No - Patient declined No - Patient declined Yes (MAU/Ambulatory/Procedural Areas - Information given) Yes (MAU/Ambulatory/Procedural Areas - Information given)    Current Medications (verified) Outpatient Encounter Medications as of 07/25/2023  Medication Sig   acyclovir (ZOVIRAX) 400 MG tablet TAKE 1 TABLET BY MOUTH EVERY TWELVE HOURS (AT BREAKFAST AND AT BEDTIME)   alendronate (FOSAMAX) 70 MG tablet Take 1 tablet (70 mg total) by mouth every 7 (seven) days. Take with a full glass of water on an empty stomach.   Apple Cider Vinegar 300 MG TABS Take by mouth.   aspirin 81 MG chewable tablet Chew 1 tablet (81 mg total) by mouth 2 (two) times daily.   atorvastatin (LIPITOR) 20 MG tablet TAKE 1 TABLET BY MOUTH AT BEDTIME   Blood Pressure Monitoring (BLOOD PRESSURE MONITOR/L CUFF) MISC Dx: R03.0, elevated bp in office   cholecalciferol (VITAMIN D3) 25 MCG (1000 UNIT) tablet Take 1,000 Units by mouth daily.   clonazePAM (KLONOPIN) 0.5 MG tablet TAKE 1 TABLET BY MOUTH AT BEDTIME   Continuous Glucose Sensor (DEXCOM G7 SENSOR) MISC Use to test BG 4+ times daily. Change sensor every 10 days. E11.65   donepezil (ARICEPT) 10 MG tablet TAKE 1 TABLET BY MOUTH EVERY DAY   EPINEPHrine 0.3 mg/0.3 mL IJ SOAJ injection Inject 0.3 mg into the muscle as needed for anaphylaxis.   escitalopram (  LEXAPRO) 20 MG tablet TAKE 1 TABLET BY MOUTH EVERY DAY   glucose blood (TRUE METRIX BLOOD GLUCOSE TEST) test strip USE AS DIRECTED   hydrocortisone (ANUSOL-HC) 2.5 % rectal cream Place 1 Application rectally 2 (two) times daily.   insulin glargine (LANTUS) 100 UNIT/ML Solostar Pen Inject 40 Units into the skin at bedtime.   insulin lispro (HUMALOG) 100 UNIT/ML KwikPen Inject 8-14 Units into the skin 3 (three) times daily.   Insulin Pen Needle (BD AUTOSHIELD DUO) 30G X 5 MM MISC USE AS DIRECTED   levothyroxine (SYNTHROID) 88 MCG tablet Take  1 tablet (88 mcg total) by mouth daily.   loperamide (IMODIUM) 2 MG capsule Take 2 mg by mouth See admin instructions. Take 2 mg PO after each loose stool as needed for diarrhea (max of 3 doses in 24 hours)   methocarbamol (ROBAXIN) 500 MG tablet Take 1 tablet (500 mg total) by mouth 2 (two) times daily.   metoCLOPramide (REGLAN) 5 MG tablet TAKE 1 TABLET BY MOUTH FOUR TIMES DAILY   nystatin cream (MYCOSTATIN) apply TO SKIN folds, UNDER breasts, abdominal folds, AND inguinal folds TWICE DAILY FOR candidasis   ondansetron (ZOFRAN) 4 MG tablet Take 1 tablet (4 mg total) by mouth every 6 (six) hours as needed.   polyethylene glycol powder (GLYCOLAX/MIRALAX) 17 GM/SCOOP powder SMARTSIG:1 Capful(s) By Mouth Daily   prazosin (MINIPRESS) 2 MG capsule TAKE ONE CAPSULE BY MOUTH AT BEDTIME   predniSONE (DELTASONE) 20 MG tablet Take 1 tablet (20 mg total) by mouth 2 (two) times daily with a meal.   Semaglutide (RYBELSUS) 14 MG TABS Take 1 tablet (14 mg total) by mouth daily.   senna (SENOKOT) 8.6 MG tablet Take 1 tablet (8.6 mg total) by mouth daily.   Specialty Vitamins Products (COLLAGEN ULTRA PO) Take by mouth.   Tetrahydrozoline-Zn Sulfate (EYE DROPS AR OP) Apply to eye. As needed   traZODone (DESYREL) 50 MG tablet TAKE 2 TABLETS BY MOUTH AT BEDTIME   trospium (SANCTURA) 20 MG tablet Take 1 tablet (20 mg total) by mouth 2 (two) times daily.   Vibegron (GEMTESA) 75 MG TABS Take 75 mg by mouth daily.   vitamin B-12 (CYANOCOBALAMIN) 1000 MCG tablet Take 1,000 mcg by mouth daily.   Vitamin D, Ergocalciferol, (DRISDOL) 1.25 MG (50000 UNIT) CAPS capsule TAKE ONE CAPSULE BY MOUTH ONCE WEEKLY ON TUESDAY   Na Sulfate-K Sulfate-Mg Sulf 17.5-3.13-1.6 GM/177ML SOLN As directed (Patient not taking: Reported on 07/25/2023)   No facility-administered encounter medications on file as of 07/25/2023.    Allergies (verified) Ambien [zolpidem tartrate], Bee venom, Coconut flavor, Codeine, Onion, Other, and Sulfa  antibiotics   History: Past Medical History:  Diagnosis Date   Anemia    as a teenager   Anxiety    Arthritis    knees, rt hand,hips   Cancer (HCC)    vaginal,uterine,ovariam   COPD (chronic obstructive pulmonary disease) (HCC)    Depression    Diabetes mellitus without complication (HCC)    Hypothyroidism    Neuromuscular disorder (HCC)    hands and feet   Past Surgical History:  Procedure Laterality Date   ABDOMINAL HYSTERECTOMY     BREAST SURGERY Right 1988   lumpectomy   EYE SURGERY Bilateral    FRACTURE SURGERY     Rt ankle,lt knee cap,lt shoulder,rt wrist,rt shoulder,   TONSILLECTOMY     at age 71   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   Procedure: TOTAL KNEE ARTHROPLASTY;  Surgeon: Frederico Hamman, MD;  Location: [facility];  Service: Orthopedics;  Laterality: Right;   Family History  Problem Relation Age of Onset   Cancer Mother    Hypertension Mother    Thyroid disease Mother    Diabetes Mother    Stroke Mother    Heart failure Mother    Diabetes Father    Heart attack Father    Diabetes Sister    Diabetes Brother    Social History   Socioeconomic History   Marital status: Married    Spouse name: Not on file   Number of children: Not on file   Years of education: Not on file   Highest education level: Not on file  Occupational History   Not on file  Tobacco Use   Smoking status: Former    Current packs/day: 0.00    Average packs/day: 0.5 packs/day for 15.0 years (7.5 ttl pk-yrs)    Types: Cigarettes    Start date: 59    Quit date: 2013    Years since quitting: 11.5   Smokeless tobacco: Never  Vaping Use   Vaping status: Never Used  Substance and Sexual Activity   Alcohol use: Never   Drug use: Never   Sexual activity: Not on file  Other Topics Concern   Not on file  Social History Narrative   Not on file   Social Determinants of Health   Financial Resource Strain: Low Risk  (07/25/2023)   Overall Financial Resource Strain (CARDIA)     Difficulty of Paying Living Expenses: Not hard at all  Food Insecurity: No Food Insecurity (07/25/2023)   Hunger Vital Sign    Worried About Running Out of Food in the Last Year: Never true    Ran Out of Food in the Last Year: Never true  Transportation Needs: No Transportation Needs (07/25/2023)   PRAPARE - Administrator, Civil Service (Medical): No    Lack of Transportation (Non-Medical): No  Physical Activity: Sufficiently Active (07/25/2023)   Exercise Vital Sign    Days of Exercise per Week: 7 days    Minutes of Exercise per Session: 30 min  Stress: Stress Concern Present (07/25/2023)   [employer] of Occupational Health - Occupational Stress Questionnaire    Feeling of Stress : Very much  Social Connections: Moderately Isolated (07/25/2023)   Social Connection and Isolation Panel [NHANES]    Frequency of Communication with Friends and Family: More than three times a week    Frequency of Social Gatherings with Friends and Family: More than three times a week    Attends Religious Services: Never    Database administrator or Organizations: No    Attends Engineer, structural: Never    Marital Status: Married    Tobacco Counseling Counseling given: Yes   Clinical Intake:  Pre-visit preparation completed: Yes  Pain : 0-10 Pain Score: 5  Pain Type: Chronic pain Pain Location: Back Pain Orientation: Mid Pain Descriptors / Indicators: Aching Pain Onset: More than a month ago Pain Frequency: Constant     BMI - recorded: 28.51 Nutritional Status: BMI 25 -29 Overweight Nutritional Risks: None Diabetes: Yes CBG done?: No (telehealth visit. Patient states she checkes CBG 4 x daily and it was 131 this morning) Did pt. bring in CBG monitor from home?: No (telehealth visit)  How often do you need to have someone help you when you read instructions, pamphlets, or other written materials from your doctor or pharmacy?: 1 - Never  Interpreter Needed?:  No  Information entered by :: Abby Yoselyn Mcglade, CMA   Activities of Daily Living    07/25/2023   10:22 AM  In your present state of health, do you have any difficulty performing the following activities:  Hearing? 0  Vision? 0  Difficulty concentrating or making decisions? 0  Walking or climbing stairs? 0  Dressing or bathing? 0  Doing errands, shopping? 0  Preparing Food and eating ? N  Using the Toilet? N  In the past six months, have you accidently leaked urine? N  Do you have problems with loss of bowel control? N  Managing your Medications? N  Managing your Finances? N  Housekeeping or managing your Housekeeping? N    Patient Care Team: Gilmore Laroche, FNP as PCP - General (Family Medicine)  Indicate any recent Medical Services you may have received from other than [facility] providers in the past year (date may be approximate).     Assessment:   This is a routine wellness examination for [name].  Hearing/Vision screen Hearing Screening - Comments:: Patient denies any hearing difficulties.    Dietary issues and exercise activities discussed:     Goals Addressed               This Visit's Progress     patient (pt-stated)        Goals for this year are to decrease back pain, not have black stools, and lose weight       Depression Screen    07/25/2023   10:11 AM 07/08/2023    8:17 AM 07/08/2023    8:11 AM 04/05/2023    1:21 PM 03/22/2023   11:25 AM 01/02/2023    2:14 PM 08/22/2022    9:08 AM  PHQ 2/9 Scores  PHQ - 2 Score 3 2 0 4 0 1 0  PHQ- 9 Score 11 5 0 15 11 5      Fall Risk    07/25/2023   10:21 AM 07/08/2023    8:17 AM 07/08/2023    8:11 AM 04/05/2023    1:21 PM 03/22/2023   11:25 AM  Fall Risk   Falls in the past year? 1 0 0 0 1  Number falls in past yr: 1 0 0 0 1  Injury with Fall? 0  0 0 1  Risk for fall due to : Impaired balance/gait;Impaired mobility;History of fall(s)  No Fall Risks No Fall Risks Impaired balance/gait;Impaired mobility  Follow up  Education provided;Falls prevention discussed  Falls evaluation completed Falls evaluation completed Falls evaluation completed    MEDICARE RISK AT HOME:  Medicare Risk at Home - 07/25/23 1019     Any stairs in or around the home? Yes    If so, are there any without handrails? No    Home free of loose throw rugs in walkways, pet beds, electrical cords, etc? Yes    Adequate lighting in your home to reduce risk of falls? Yes    Life alert? No    Use of a cane, walker or w/c? Yes    Grab bars in the bathroom? No    Shower chair or bench in shower? Yes    Elevated toilet seat or a handicapped toilet? Yes             TIMED UP AND GO:  Was the test performed?  No    Cognitive Function:        07/25/2023   10:08 AM 06/18/2022    5:01 PM  6CIT Screen  What Year? 0 points 0 points  What month? 0 points 0 points  What time? 0 points 0 points  Count back from 20 0 points 0 points  Months in reverse 0 points 0 points  Repeat phrase 0 points 0 points  Total Score 0 points 0 points    Immunizations Immunization History  Administered Date(s) Administered   Fluad Quad(high Dose 65+) 12/03/2022   Pneumococcal Polysaccharide-23 08/22/2022   Tdap 05/29/2005, 05/22/2022   Zoster Recombinant(Shingrix) 05/22/2022    TDAP status: Up to date  Flu Vaccine status: Due, Education has been provided regarding the importance of this vaccine. Advised may receive this vaccine at local pharmacy or Health Dept. Aware to provide a copy of the vaccination record if obtained from local pharmacy or Health Dept. Verbalized acceptance and understanding.  Pneumococcal vaccine status: Up to date  Covid-19 vaccine status: Information provided on how to obtain vaccines.   Qualifies for Shingles Vaccine? Yes   Zostavax completed Yes   Shingrix Completed?: Yes  Patient states she had the 2nd vaccine done at [facility]. She will call and ask them to fax the office the information so her chart can be  updated.   Screening Tests Health Maintenance  Topic Date Due   Zoster Vaccines- Shingrix (2 of 2) 07/17/2022   COVID-19 Vaccine (1 - 2023-24 season) Never done   Medicare Annual Wellness (AWV)  06/19/2023   INFLUENZA VACCINE  07/25/2023   Pneumonia Vaccine 59+ Years old (2 of 2 - PCV) 08/23/2023   FOOT EXAM  08/23/2023   HEMOGLOBIN A1C  12/17/2023   Diabetic kidney evaluation - Urine ACR  02/22/2024   OPHTHALMOLOGY EXAM  05/21/2024   Diabetic kidney evaluation - eGFR measurement  07/07/2024   MAMMOGRAM  09/28/2024   Fecal DNA (Cologuard)  06/29/2025   DTaP/Tdap/Td (3 - Td or Tdap) 05/22/2032   DEXA SCAN  Completed   Hepatitis C Screening  Completed   HPV VACCINES  Aged Out    Health Maintenance  Health Maintenance Due  Topic Date Due   Zoster Vaccines- Shingrix (2 of 2) 07/17/2022   COVID-19 Vaccine (1 - 2023-24 season) Never done   Medicare Annual Wellness (AWV)  06/19/2023   INFLUENZA VACCINE  07/25/2023   Pneumonia Vaccine 74+ Years old (2 of 2 - PCV) 08/23/2023    Colorectal cancer screening: Type of screening: Cologuard. Completed 06/29/2022. Repeat every 3 years  Mammogram status: Ordered 07/25/2023. Pt provided with contact info and advised to call to schedule appt.   Bone Density status: Completed 09/28/2022. Results reflect: Bone density results: OSTEOPOROSIS. Repeat every 2 years.  Lung Cancer Screening: (Low Dose CT Chest recommended if Age 67-80 years, 20 pack-year currently smoking OR have quit w/in 15years.) does not qualify.   Additional Screening:  Hepatitis C Screening: does not qualify; Completed 05/22/2022  Vision Screening: Recommended annual ophthalmology exams for early detection of glaucoma and other disorders of the eye. Is the patient up to date with their annual eye exam?  Yes  Who is the provider or what is the name of the office in which the patient attends annual eye exams? Needs referral/referral placed If pt is not established with a  provider, would they like to be referred to a provider to establish care? Yes .   Dental Screening: Recommended annual dental exams for proper oral hygiene  Diabetic Foot Exam: Diabetic Foot Exam: Completed 08/22/2022  Community Resource Referral / Chronic Care Management: CRR required this visit?  No  CCM required this visit?  PCP informed of CCM need     Plan:     I have personally reviewed and noted the following in the patient's chart:   Medical and social history Use of alcohol, tobacco or illicit drugs  Current medications and supplements including opioid prescriptions. Patient is not currently taking opioid prescriptions. Functional ability and status Nutritional status Physical activity Advanced directives List of other physicians Hospitalizations, surgeries, and ER visits in previous 12 months Vitals Screenings to include cognitive, depression, and falls Referrals and appointments  In addition, I have reviewed and discussed with patient certain preventive protocols, quality metrics, and best practice recommendations. A written personalized care plan for preventive services as well as general preventive health recommendations were provided to patient.     Jordan Hawks Maisie Hauser, CMA   07/25/2023   After Visit Summary: (Mail) Due to this being a telephonic visit, the after visit summary with patients personalized plan was offered to patient via mail   Nurse Notes: Patient scored 11 on PHQ. She is requesting a referral to [facility] in [place] to remain within the [facility] system. She is aware she may have to be seen in the office.

## 2023-07-31 NOTE — Telephone Encounter (Signed)
Please ask the patient to schedule an appointment for further assessment of her depression before we proceed with a referral to behavioral health.

## 2023-08-01 ENCOUNTER — Other Ambulatory Visit: Payer: Self-pay | Admitting: *Deleted

## 2023-08-01 DIAGNOSIS — E119 Type 2 diabetes mellitus without complications: Secondary | ICD-10-CM

## 2023-08-01 MED ORDER — DEXCOM G7 SENSOR MISC
1 refills | Status: DC
Start: 2023-08-01 — End: 2023-11-04

## 2023-08-05 ENCOUNTER — Telehealth (INDEPENDENT_AMBULATORY_CARE_PROVIDER_SITE_OTHER): Payer: Self-pay | Admitting: Internal Medicine

## 2023-08-05 ENCOUNTER — Other Ambulatory Visit: Payer: Self-pay | Admitting: Family Medicine

## 2023-08-05 DIAGNOSIS — B009 Herpesviral infection, unspecified: Secondary | ICD-10-CM

## 2023-08-05 DIAGNOSIS — F32A Depression, unspecified: Secondary | ICD-10-CM

## 2023-08-05 NOTE — Telephone Encounter (Signed)
Patient left voice [name] is having a colonoscopy tomorrow-has some questions-please advise-ph# [phone]

## 2023-08-06 ENCOUNTER — Encounter (HOSPITAL_COMMUNITY)
Admission: RE | Disposition: A | Discharge status: Home / Self Care | Payer: Self-pay | Source: Home / Self Care | Attending: Internal Medicine

## 2023-08-06 ENCOUNTER — Ambulatory Visit (HOSPITAL_COMMUNITY)
Admission: RE | Admit: 2023-08-06 | Discharge: 2023-08-06 | Disposition: A | Discharge status: Home / Self Care | Payer: Medicare Other | Attending: Internal Medicine | Admitting: Internal Medicine

## 2023-08-06 ENCOUNTER — Other Ambulatory Visit (HOSPITAL_COMMUNITY): Payer: Self-pay

## 2023-08-06 ENCOUNTER — Ambulatory Visit (HOSPITAL_BASED_OUTPATIENT_CLINIC_OR_DEPARTMENT_OTHER): Payer: Medicare Other | Admitting: Anesthesiology

## 2023-08-06 ENCOUNTER — Other Ambulatory Visit: Payer: Self-pay

## 2023-08-06 ENCOUNTER — Ambulatory Visit (HOSPITAL_COMMUNITY): Payer: Medicare Other | Admitting: Anesthesiology

## 2023-08-06 ENCOUNTER — Encounter (HOSPITAL_COMMUNITY): Payer: Self-pay

## 2023-08-06 DIAGNOSIS — K2970 Gastritis, unspecified, without bleeding: Secondary | ICD-10-CM

## 2023-08-06 DIAGNOSIS — K529 Noninfective gastroenteritis and colitis, unspecified: Secondary | ICD-10-CM | POA: Insufficient documentation

## 2023-08-06 DIAGNOSIS — D126 Benign neoplasm of colon, unspecified: Secondary | ICD-10-CM

## 2023-08-06 DIAGNOSIS — K648 Other hemorrhoids: Secondary | ICD-10-CM | POA: Diagnosis not present

## 2023-08-06 DIAGNOSIS — R6881 Early satiety: Secondary | ICD-10-CM | POA: Insufficient documentation

## 2023-08-06 DIAGNOSIS — D125 Benign neoplasm of sigmoid colon: Secondary | ICD-10-CM | POA: Diagnosis not present

## 2023-08-06 DIAGNOSIS — Z7952 Long term (current) use of systemic steroids: Secondary | ICD-10-CM | POA: Insufficient documentation

## 2023-08-06 DIAGNOSIS — Z7984 Long term (current) use of oral hypoglycemic drugs: Secondary | ICD-10-CM | POA: Insufficient documentation

## 2023-08-06 DIAGNOSIS — E119 Type 2 diabetes mellitus without complications: Secondary | ICD-10-CM | POA: Insufficient documentation

## 2023-08-06 DIAGNOSIS — D122 Benign neoplasm of ascending colon: Secondary | ICD-10-CM | POA: Diagnosis not present

## 2023-08-06 DIAGNOSIS — K3189 Other diseases of stomach and duodenum: Secondary | ICD-10-CM | POA: Diagnosis not present

## 2023-08-06 DIAGNOSIS — R630 Anorexia: Secondary | ICD-10-CM

## 2023-08-06 DIAGNOSIS — J449 Chronic obstructive pulmonary disease, unspecified: Secondary | ICD-10-CM | POA: Diagnosis not present

## 2023-08-06 DIAGNOSIS — R1013 Epigastric pain: Secondary | ICD-10-CM | POA: Insufficient documentation

## 2023-08-06 DIAGNOSIS — Z794 Long term (current) use of insulin: Secondary | ICD-10-CM | POA: Insufficient documentation

## 2023-08-06 DIAGNOSIS — R197 Diarrhea, unspecified: Secondary | ICD-10-CM

## 2023-08-06 DIAGNOSIS — K921 Melena: Secondary | ICD-10-CM

## 2023-08-06 DIAGNOSIS — Z87891 Personal history of nicotine dependence: Secondary | ICD-10-CM | POA: Diagnosis not present

## 2023-08-06 DIAGNOSIS — K5730 Diverticulosis of large intestine without perforation or abscess without bleeding: Secondary | ICD-10-CM | POA: Insufficient documentation

## 2023-08-06 HISTORY — PX: POLYPECTOMY: SHX5525

## 2023-08-06 HISTORY — PX: BIOPSY: SHX5522

## 2023-08-06 HISTORY — PX: ESOPHAGOGASTRODUODENOSCOPY (EGD) WITH PROPOFOL: SHX5813

## 2023-08-06 HISTORY — PX: COLONOSCOPY WITH PROPOFOL: SHX5780

## 2023-08-06 LAB — GLUCOSE, CAPILLARY: Glucose-Capillary: 111 mg/dL — ABNORMAL HIGH (ref 70–99)

## 2023-08-06 SURGERY — COLONOSCOPY WITH PROPOFOL
Anesthesia: General

## 2023-08-06 MED ORDER — PROPOFOL 500 MG/50ML IV EMUL
INTRAVENOUS | Status: DC | PRN
  Administered 2023-08-06: 150 ug/kg/min via INTRAVENOUS

## 2023-08-06 MED ORDER — LACTATED RINGERS IV SOLN: INTRAVENOUS | Status: DC

## 2023-08-06 MED ORDER — PANTOPRAZOLE SODIUM 40 MG PO TBEC: 40.0000 mg | DELAYED_RELEASE_TABLET | Freq: Every day | ORAL | 11 refills | Status: DC

## 2023-08-06 MED ORDER — PROPOFOL 10 MG/ML IV BOLUS
INTRAVENOUS | Status: DC | PRN
Start: 2023-08-06 — End: 2023-08-06
  Administered 2023-08-06: 100 mg via INTRAVENOUS

## 2023-08-06 MED ORDER — LIDOCAINE HCL (CARDIAC) PF 100 MG/5ML IV SOSY
PREFILLED_SYRINGE | INTRAVENOUS | Status: DC | PRN
  Administered 2023-08-06: 50 mg via INTRAVENOUS

## 2023-08-06 NOTE — Discharge Instructions (Addendum)
EGD Discharge instructions Please read the instructions outlined below and refer to this sheet in the next few weeks. These discharge instructions provide you with general information on caring for yourself after you leave the hospital. Your doctor may also give you specific instructions. While your treatment has been planned according to the most current medical practices available, unavoidable complications occasionally occur. If you have any problems or questions after discharge, please call your doctor. ACTIVITY You may resume your regular activity but move at a slower pace for the next 24 hours.  Take frequent rest periods for the next 24 hours.  Walking will help expel (get rid of) the air and reduce the bloated feeling in your abdomen.  No driving for 24 hours (because of the anesthesia (medicine) used during the test).  You may shower.  Do not sign any important legal documents or operate any machinery for 24 hours (because of the anesthesia used during the test).  NUTRITION Drink plenty of fluids.  You may resume your normal diet.  Begin with a light meal and progress to your normal diet.  Avoid alcoholic beverages for 24 hours or as instructed by your caregiver.  MEDICATIONS You may resume your normal medications unless your caregiver tells you otherwise.  WHAT YOU CAN EXPECT TODAY You may experience abdominal discomfort such as a feeling of fullness or "gas" pains.  FOLLOW-UP Your doctor will discuss the results of your test with you.  SEEK IMMEDIATE MEDICAL ATTENTION IF ANY OF THE FOLLOWING OCCUR: Excessive nausea (feeling sick to your stomach) and/or vomiting.  Severe abdominal pain and distention (swelling).  Trouble swallowing.  Temperature over 101 F (37.8 C).  Rectal bleeding or vomiting of blood.     Colonoscopy Discharge Instructions  Read the instructions outlined below and refer to this sheet in the next few weeks. These discharge instructions provide you with  general information on caring for yourself after you leave the hospital. Your doctor may also give you specific instructions. While your treatment has been planned according to the most current medical practices available, unavoidable complications occasionally occur.   ACTIVITY You may resume your regular activity, but move at a slower pace for the next 24 hours.  Take frequent rest periods for the next 24 hours.  Walking will help get rid of the air and reduce the bloated feeling in your belly (abdomen).  No driving for 24 hours (because of the medicine (anesthesia) used during the test).   Do not sign any important legal documents or operate any machinery for 24 hours (because of the anesthesia used during the test).  NUTRITION Drink plenty of fluids.  You may resume your normal diet as instructed by your doctor.  Begin with a light meal and progress to your normal diet. Heavy or fried foods are harder to digest and may make you feel sick to your stomach (nauseated).  Avoid alcoholic beverages for 24 hours or as instructed.  MEDICATIONS You may resume your normal medications unless your doctor tells you otherwise.  WHAT YOU CAN EXPECT TODAY Some feelings of bloating in the abdomen.  Passage of more gas than usual.  Spotting of blood in your stool or on the toilet paper.  IF YOU HAD POLYPS REMOVED DURING THE COLONOSCOPY: No aspirin products for 7 days or as instructed.  No alcohol for 7 days or as instructed.  Eat a soft diet for the next 24 hours.  FINDING OUT THE RESULTS OF YOUR TEST Not all test results are  available during your visit. If your test results are not back during the visit, make an appointment with your caregiver to find out the results. Do not assume everything is normal if you have not heard from your caregiver or the medical facility. It is important for you to follow up on all of your test results.  SEEK IMMEDIATE MEDICAL ATTENTION IF: You have more than a spotting of  blood in your stool.  Your belly is swollen (abdominal distention).  You are nauseated or vomiting.  You have a temperature over 101.  You have abdominal pain or discomfort that is severe or gets worse throughout the day.   Your EGD revealed mild amount inflammation in your stomach.  I took biopsies of this to rule out infection with a bacteria called H. pylori.  Await pathology results, my office will contact you.  Esophagus and small bowel appeared normal.  start you on a new medication called pantoprazole 40 mg daily to help with the inflammation.    Your colonoscopy revealed 2 polyp(s) which I removed successfully. Await pathology results, my office will contact you.  Given your age, I do not think we need to perform further colonoscopy for polyp surveillance.  I took samples of your colon to rule out a condition called microscopic colitis which can cause chronically loose stools especially in women.  You also have diverticulosis and internal hemorrhoids. I would recommend increasing fiber in your diet or adding OTC Benefiber/Metamucil. Be sure to drink at least 4 to 6 glasses of water daily. Follow-up with GI 2 to 3 months.  Congratulations on your new grandbaby!   I hope you have a great rest of your week!  Hennie Duos. Marletta Lor, D.O. Gastroenterology and Hepatology [facility]

## 2023-08-06 NOTE — Anesthesia Postprocedure Evaluation (Signed)
Anesthesia Post Note  Patient: [name]  Procedure(s) Performed: COLONOSCOPY WITH PROPOFOL ESOPHAGOGASTRODUODENOSCOPY (EGD) WITH PROPOFOL BIOPSY POLYPECTOMY  Patient location during evaluation: Phase II Anesthesia Type: General Level of consciousness: awake and alert and oriented Pain management: pain level controlled Vital Signs Assessment: post-procedure vital signs reviewed and stable Respiratory status: spontaneous breathing, nonlabored ventilation and respiratory function stable Cardiovascular status: blood pressure returned to baseline and stable Postop Assessment: no apparent nausea or vomiting Anesthetic complications: no  No notable events documented.   Last Vitals:  Vitals:   08/06/23 0938 08/06/23 0943  BP: (!) 101/39 (!) 122/51  Pulse: (!) 55   Resp: 16 18  Temp: 36.5 Angelica   SpO2: 97% 97%    Last Pain:  Vitals:   08/06/23 0938  TempSrc: Oral  PainSc: 0-No pain                 [name] [name]

## 2023-08-06 NOTE — Transfer of Care (Signed)
Immediate Anesthesia Transfer of Care Note  Patient: [name]  Procedure(s) Performed: COLONOSCOPY WITH PROPOFOL ESOPHAGOGASTRODUODENOSCOPY (EGD) WITH PROPOFOL BIOPSY POLYPECTOMY  Patient Location: Endoscopy Unit  Anesthesia Type:General  Level of Consciousness: drowsy  Airway & Oxygen Therapy: Patient Spontanous Breathing  Post-op Assessment: Report given to RN and Post -op Vital signs reviewed and stable  Post vital signs: Reviewed and stable  Last Vitals:  Vitals Value Taken Time  BP 101/39 08/06/23 0938  Temp 36.5 C 08/06/23 0938  Pulse 55 08/06/23 0938  Resp 16 08/06/23 0938  SpO2 97 % 08/06/23 0938    Last Pain:  Vitals:   08/06/23 0938  TempSrc: Oral  PainSc: 0-No pain      Patients Stated Pain Goal: 8 (08/06/23 0803)  Complications: No notable events documented.

## 2023-08-06 NOTE — Anesthesia Preprocedure Evaluation (Signed)
Anesthesia Evaluation  Patient identified by MRN, date of birth, ID band Patient awake    Reviewed: Allergy & Precautions, H&P , NPO status , Patient's Chart, lab work & pertinent test results  Airway Mallampati: II  TM Distance: >3 FB Neck ROM: Full   Comment: Neck pain Dental  (+) Upper Dentures, Lower Dentures   Pulmonary COPD, former smoker   Pulmonary exam normal breath sounds clear to auscultation       Cardiovascular negative cardio ROS Normal cardiovascular exam Rhythm:Regular Rate:Normal     Neuro/Psych  PSYCHIATRIC DISORDERS Anxiety Depression   Dementia  Neuromuscular disease    GI/Hepatic negative GI ROS, Neg liver ROS,,,  Endo/Other  diabetes, Well Controlled, Type 2, Oral Hypoglycemic AgentsHypothyroidism    Renal/GU negative Renal ROS  negative genitourinary   Musculoskeletal  (+) Arthritis , Osteoarthritis,    Abdominal   Peds negative pediatric ROS (+)  Hematology  (+) Blood dyscrasia, anemia   Anesthesia Other Findings   Reproductive/Obstetrics negative OB ROS                             Anesthesia Physical Anesthesia Plan  ASA: 2  Anesthesia Plan: General   Post-op Pain Management: Minimal or no pain anticipated   Induction: Intravenous  PONV Risk Score and Plan: 1 and Propofol infusion  Airway Management Planned: Nasal Cannula and Natural Airway  Additional Equipment:   Intra-op Plan:   Post-operative Plan:   Informed Consent: I have reviewed the patients History and Physical, chart, labs and discussed the procedure including the risks, benefits and alternatives for the proposed anesthesia with the patient or authorized representative who has indicated his/her understanding and acceptance.     Dental advisory given  Plan Discussed with: CRNA and Surgeon  Anesthesia Plan Comments:        Anesthesia Quick Evaluation

## 2023-08-06 NOTE — Interval H&P Note (Signed)
History and Physical Interval Note:  08/06/2023 9:00 AM  [name]  has presented today for surgery, with the diagnosis of diarrhea, melena, poor appetite.  The various methods of treatment have been discussed with the patient and family. After consideration of risks, benefits and other options for treatment, the patient has consented to  Procedure(s) with comments: COLONOSCOPY WITH PROPOFOL (N/A) - 900am, asa 2 ESOPHAGOGASTRODUODENOSCOPY (EGD) WITH PROPOFOL (N/A) as a surgical intervention.  The patient's history has been reviewed, patient examined, no change in status, stable for surgery.  I have reviewed the patient's chart and labs.  Questions were answered to the patient's satisfaction.     [name]

## 2023-08-06 NOTE — Op Note (Signed)
[facility] Patient Name: [name] Procedure Date: 08/06/2023 8:58 AM MRN: [id number] Date of Birth: 21-Dec-1951 Attending MD: Hennie Duos. Marletta Lor , [place], [id number] CSN: [id number] Age: 72 Admit Type: Outpatient Procedure:                Upper GI endoscopy Indications:              Epigastric abdominal pain, Early satiety Providers:                Hennie Duos. Marletta Lor, DO, Angelica Ran, Kristine L.                            Jessee Avers, Technician Referring MD:              Medicines:                See the Anesthesia note for documentation of the                            administered medications Complications:            No immediate complications. Estimated Blood Loss:     Estimated blood loss was minimal. Procedure:                Pre-Anesthesia Assessment:                           - The anesthesia plan was to use monitored                            anesthesia care (MAC).                           After obtaining informed consent, the endoscope was                            passed under direct vision. Throughout the                            procedure, the patient's blood pressure, pulse, and                            oxygen saturations were monitored continuously. The                            [id number]-H190 ([id number]) scope was introduced through the                            mouth, and advanced to the second part of duodenum.                            The upper GI endoscopy was accomplished without                            difficulty. The patient tolerated the procedure                            well.  Scope In: 9:08:30 AM Scope Out: 9:12:38 AM Total Procedure Duration: 0 hours 4 minutes 8 seconds  Findings:      The Z-line was regular.      Patchy mild inflammation characterized by erythema was found in the       gastric body. Biopsies were taken with a cold forceps for Helicobacter       pylori testing.      The duodenal bulb, first portion of the duodenum and  second portion of       the duodenum were normal. Impression:               - Z-line regular.                           - Gastritis. Biopsied.                           - Normal duodenal bulb, first portion of the                            duodenum and second portion of the duodenum. Moderate Sedation:      Per Anesthesia Care Recommendation:           - Patient has a contact number available for                            emergencies. The signs and symptoms of potential                            delayed complications were discussed with the                            patient. Return to normal activities tomorrow.                            Written discharge instructions were provided to the                            patient.                           - Resume previous diet.                           - Continue present medications.                           - Await pathology results.                           - Return to GI clinic in 3 months.                           - Use a proton pump inhibitor PO daily.                           - Consider GES Procedure Code(s):        --- Professional ---  62130, Esophagogastroduodenoscopy, flexible,                            transoral; with biopsy, single or multiple Diagnosis Code(s):        --- Professional ---                           K29.70, Gastritis, unspecified, without bleeding                           R10.13, Epigastric pain                           R68.81, Early satiety CPT copyright 2022 American Medical Association. All rights reserved. The codes documented in this report are preliminary and upon coder review may  be revised to meet current compliance requirements. Hennie Duos. Marletta Lor, DO Hennie Duos. Marletta Lor, DO 08/06/2023 9:16:09 AM This report has been signed electronically. Number of Addenda: 0

## 2023-08-06 NOTE — Op Note (Signed)
[facility] Patient Name: [name] Procedure Date: 08/06/2023 9:14 AM MRN: [id number] Date of Birth: Nov 02, 1951 Attending MD: Hennie Duos. Marletta Lor , [place], [id number] CSN: [id number] Age: 72 Admit Type: Outpatient Procedure:                Colonoscopy Indications:              Chronic diarrhea Providers:                Hennie Duos. Marletta Lor, DO, Angelica Ran, Kristine L.                            Jessee Avers, Technician Referring MD:              Medicines:                See the Anesthesia note for documentation of the                            administered medications Complications:            No immediate complications. Estimated Blood Loss:     Estimated blood loss was minimal. Procedure:                Pre-Anesthesia Assessment:                           - The anesthesia plan was to use monitored                            anesthesia care (MAC).                           After obtaining informed consent, the colonoscope                            was passed under direct vision. Throughout the                            procedure, the patient's blood pressure, pulse, and                            oxygen saturations were monitored continuously. The                            [id number]-HQ190L ([id number]) scope was introduced through                            the anus and advanced to the the terminal ileum,                            with identification of the appendiceal orifice and                            IC valve. The colonoscopy was performed without                            difficulty. The patient tolerated the procedure  well. The quality of the bowel preparation was                            evaluated using the BBPS [facility] Bowel Preparation                            Scale) with scores of: Right Colon = 3, Transverse                            Colon = 3 and Left Colon = 3 (entire mucosa seen                            well with no residual  staining, small fragments of                            stool or opaque liquid). The total BBPS score                            equals 9. Scope In: 9:18:29 AM Scope Out: 9:33:28 AM Scope Withdrawal Time: 0 hours 10 minutes 56 seconds  Total Procedure Duration: 0 hours 14 minutes 59 seconds  Findings:      Non-bleeding internal hemorrhoids were found during endoscopy.      Multiple large-mouthed and small-mouthed diverticula were found in the       sigmoid colon.      An 8 mm polyp was found in the ascending colon. The polyp was sessile.       The polyp was removed with a cold snare. Resection and retrieval were       complete.      A 5 mm polyp was found in the sigmoid colon. The polyp was sessile. The       polyp was removed with a cold snare. Resection and retrieval were       complete.      Biopsies for histology were taken with a cold forceps from the ascending       colon, transverse colon and descending colon for evaluation of       microscopic colitis.      The terminal ileum appeared normal. Impression:               - Non-bleeding internal hemorrhoids.                           - Diverticulosis in the sigmoid colon.                           - One 8 mm polyp in the ascending colon, removed                            with a cold snare. Resected and retrieved.                           - One 5 mm polyp in the sigmoid colon, removed with  a cold snare. Resected and retrieved.                           - The examined portion of the ileum was normal.                           - Biopsies were taken with a cold forceps from the                            ascending colon, transverse colon and descending                            colon for evaluation of microscopic colitis. Moderate Sedation:      Per Anesthesia Care Recommendation:           - Patient has a contact number available for                            emergencies. The signs and symptoms of  potential                            delayed complications were discussed with the                            patient. Return to normal activities tomorrow.                            Written discharge instructions were provided to the                            patient.                           - Resume previous diet.                           - Continue present medications.                           - Await pathology results.                           - No repeat colonoscopy due to age.                           - Return to GI clinic in 3 months. Procedure Code(s):        --- Professional ---                           [phone], Colonoscopy, flexible; with removal of                            tumor(s), polyp(s), or other lesion(s) by snare                            technique  25366, 59, Colonoscopy, flexible; with biopsy,                            single or multiple Diagnosis Code(s):        --- Professional ---                           K64.8, Other hemorrhoids                           D12.2, Benign neoplasm of ascending colon                           D12.5, Benign neoplasm of sigmoid colon                           K52.9, Noninfective gastroenteritis and colitis,                            unspecified                           K57.30, Diverticulosis of large intestine without                            perforation or abscess without bleeding CPT copyright 2022 American Medical Association. All rights reserved. The codes documented in this report are preliminary and upon coder review may  be revised to meet current compliance requirements. Hennie Duos. Marletta Lor, DO Hennie Duos. Marletta Lor, DO 08/06/2023 9:37:45 AM This report has been signed electronically. Number of Addenda: 0

## 2023-08-08 ENCOUNTER — Encounter (HOSPITAL_COMMUNITY): Payer: Self-pay | Admitting: Internal Medicine

## 2023-09-17 DIAGNOSIS — L84 Corns and callosities: Secondary | ICD-10-CM | POA: Diagnosis not present

## 2023-09-17 DIAGNOSIS — B351 Tinea unguium: Secondary | ICD-10-CM | POA: Diagnosis not present

## 2023-09-17 DIAGNOSIS — E1142 Type 2 diabetes mellitus with diabetic polyneuropathy: Secondary | ICD-10-CM | POA: Diagnosis not present

## 2023-09-17 DIAGNOSIS — M79676 Pain in unspecified toe(s): Secondary | ICD-10-CM | POA: Diagnosis not present

## 2023-09-18 ENCOUNTER — Other Ambulatory Visit (HOSPITAL_COMMUNITY): Payer: Self-pay | Admitting: Family Medicine

## 2023-09-18 DIAGNOSIS — N63 Unspecified lump in unspecified breast: Secondary | ICD-10-CM

## 2023-09-24 ENCOUNTER — Telehealth: Payer: Self-pay | Admitting: Family Medicine

## 2023-09-24 NOTE — Telephone Encounter (Signed)
Pt called in regard to black coffee ground like stools , wants a cll back to see what can help

## 2023-09-25 NOTE — Telephone Encounter (Signed)
Patient still waiting on call back from provider. Will be home until 5pm

## 2023-09-25 NOTE — Telephone Encounter (Signed)
Pt reports lumps on breast that she noticed has mammogram lined up already, also reports having bowel movement about 5 times a day that are dark like coffee ground , has concerns about this.

## 2023-09-27 NOTE — Telephone Encounter (Signed)
  Please ask the patient to schedule an office visit for further evaluation. I also recommend following up with GI as care is established with their team.

## 2023-09-27 NOTE — Telephone Encounter (Signed)
Appt scheduled pt aware  

## 2023-09-30 ENCOUNTER — Ambulatory Visit: Payer: BC Managed Care – PPO | Admitting: Internal Medicine

## 2023-10-01 ENCOUNTER — Ambulatory Visit (INDEPENDENT_AMBULATORY_CARE_PROVIDER_SITE_OTHER): Payer: Medicare Other | Admitting: Family Medicine

## 2023-10-01 ENCOUNTER — Encounter: Payer: Self-pay | Admitting: Family Medicine

## 2023-10-01 VITALS — BP 134/74 | HR 66 | Ht 66.5 in | Wt 175.1 lb

## 2023-10-01 DIAGNOSIS — N3281 Overactive bladder: Secondary | ICD-10-CM | POA: Diagnosis not present

## 2023-10-01 DIAGNOSIS — K921 Melena: Secondary | ICD-10-CM

## 2023-10-01 DIAGNOSIS — K2900 Acute gastritis without bleeding: Secondary | ICD-10-CM | POA: Diagnosis not present

## 2023-10-01 DIAGNOSIS — E119 Type 2 diabetes mellitus without complications: Secondary | ICD-10-CM

## 2023-10-01 MED ORDER — PANTOPRAZOLE SODIUM 40 MG PO TBEC
40.0000 mg | DELAYED_RELEASE_TABLET | Freq: Two times a day (BID) | ORAL | 1 refills | Status: AC
Start: 2023-10-01 — End: ?

## 2023-10-01 NOTE — Patient Instructions (Addendum)
I appreciate the opportunity to provide care to you today!    Follow up:   2 weeks  Labs: please stop by the lab today to get your blood drawn (CBC, BMP)  -Please call and follow up with GI as soon as possible -Please Start taking Protonix 40 mg BID  For managing GERD, I recommend the following lifestyle changes:  Avoid Certain Foods and Drinks: Limit or eliminate coffee, chocolate, onions, peppermint, spicy foods, carbonated beverages, citrus fruits, tomatoes, garlic, alcohol, and fatty foods such as bacon, burgers, sausages, steak, fried foods, and dairy products.  Recommended Foods: Increase your intake of high-fiber foods including whole grain cereals, oatmeal, brown rice, root vegetables, and non-citrus fruits. Opt for high-protein foods and healthy fats such as avocados, olive oil, nuts, and seeds.    Please verify if your insurance will cover myrbetriq    Please continue to a heart-healthy diet and increase your physical activities. Try to exercise for at least five days a week.    It was a pleasure to see you and I look forward to continuing to work together on your health and well-being. Please do not hesitate to call the office if you need care or have questions about your care.  In case of emergency, please visit the Emergency Department for urgent care, or contact our clinic at [phone] to schedule an appointment. We're here to help you!   Have a wonderful day and week. With Gratitude, Gilmore Laroche MSN, FNP-BC

## 2023-10-01 NOTE — Progress Notes (Unsigned)
Acute Office Visit  Subjective:    Patient ID: [name], female    DOB: 09-25-1951, 72 y.o.   MRN: [id number]  Chief Complaint  Patient presents with   Stool Color Change    Pt has stool color concerns describes it as "ground coffee", pt reports this was 2 weeks ago, hadnt been able to get in, still has diarrhea.   Follow-up    Pt reports not eating like she used to feels a little down.     HPI The patient presents today with complaints of ground coffee-like stool shortly after completing her colonoscopy. She is currently under the care of the GI team and underwent a colonoscopy and upper endoscopy on 08/06/2023, which revealed findings suggestive of gastritis. The patient is taking Protonix 40 mg daily. She denies experiencing headaches, dizziness, blurred vision, chest pain, or palpitations. The patient reports discontinuing Imodium due to experiencing explosive bowel movements. She has no fever or chills and has not reported headaches or dizziness. Additionally, she mentions a poor appetite that has been ongoing; however, there has been no unintentional weight loss noted.  Overactive bladder: The patient presents with symptoms of overactive bladder, reporting urinary incontinence characterized by an inability to hold urine upon feeling the urge to void. She mentions wearing urinary pads and frequently changing them, at times every hour. The patient denies experiencing suprapubic or back pain. These symptoms have been ongoing for approximately 2 to 3 years. Past Medical History:  Diagnosis Date   Anemia    as a teenager   Anxiety    Arthritis    knees, rt hand,hips   Cancer (HCC)    vaginal,uterine,ovariam   COPD (chronic obstructive pulmonary disease) (HCC)    Depression    Diabetes mellitus without complication (HCC)    Hypothyroidism    Neuromuscular disorder (HCC)    hands and feet    Past Surgical History:  Procedure Laterality Date   ABDOMINAL HYSTERECTOMY      BIOPSY  08/06/2023   Procedure: BIOPSY;  Surgeon: [name], DO;  Location: [facility];  Service: Endoscopy;;   BREAST SURGERY Right 1988   lumpectomy   COLONOSCOPY WITH PROPOFOL N/A 08/06/2023   Procedure: COLONOSCOPY WITH PROPOFOL;  Surgeon: [name], DO;  Location: [facility];  Service: Endoscopy;  Laterality: N/A;  900am, asa 2   ESOPHAGOGASTRODUODENOSCOPY (EGD) WITH PROPOFOL N/A 08/06/2023   Procedure: ESOPHAGOGASTRODUODENOSCOPY (EGD) WITH PROPOFOL;  Surgeon: [name], DO;  Location: [facility];  Service: Endoscopy;  Laterality: N/A;   EYE SURGERY Bilateral    FRACTURE SURGERY     Rt ankle,lt knee cap,lt shoulder,rt wrist,rt shoulder,   POLYPECTOMY  08/06/2023   Procedure: POLYPECTOMY;  Surgeon: [name], DO;  Location: [facility];  Service: Endoscopy;;   TONSILLECTOMY     at age 18   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   Procedure: TOTAL KNEE ARTHROPLASTY;  Surgeon: Frederico Hamman, MD;  Location: [facility];  Service: Orthopedics;  Laterality: Right;    Family History  Problem Relation Age of Onset   Cancer Mother    Hypertension Mother    Thyroid disease Mother    Diabetes Mother    Stroke Mother    Heart failure Mother    Diabetes Father    Heart attack Father    Diabetes Sister    Diabetes Brother     Social History   Socioeconomic History   Marital status: Married  Spouse name: Not on file   Number of children: Not on file   Years of education: Not on file   Highest education level: Not on file  Occupational History   Not on file  Tobacco Use   Smoking status: Former    Current packs/day: 0.00    Average packs/day: 0.5 packs/day for 15.0 years (7.5 ttl pk-yrs)    Types: Cigarettes    Start date: 71    Quit date: 2013    Years since quitting: 11.7   Smokeless tobacco: Never  Vaping Use   Vaping status: Never Used  Substance and Sexual Activity   Alcohol use: Never   Drug use: Never   Sexual activity: Not  on file  Other Topics Concern   Not on file  Social History Narrative   Not on file   Social Determinants of Health   Financial Resource Strain: Low Risk  (07/25/2023)   Overall Financial Resource Strain (CARDIA)    Difficulty of Paying Living Expenses: Not hard at all  Food Insecurity: No Food Insecurity (07/25/2023)   Hunger Vital Sign    Worried About Running Out of Food in the Last Year: Never true    Ran Out of Food in the Last Year: Never true  Transportation Needs: No Transportation Needs (07/25/2023)   PRAPARE - Administrator, Civil Service (Medical): No    Lack of Transportation (Non-Medical): No  Physical Activity: Sufficiently Active (07/25/2023)   Exercise Vital Sign    Days of Exercise per Week: 7 days    Minutes of Exercise per Session: 30 min  Stress: Stress Concern Present (07/25/2023)   [employer] of Occupational Health - Occupational Stress Questionnaire    Feeling of Stress : Very much  Social Connections: Moderately Isolated (07/25/2023)   Social Connection and Isolation Panel [NHANES]    Frequency of Communication with Friends and Family: More than three times a week    Frequency of Social Gatherings with Friends and Family: More than three times a week    Attends Religious Services: Never    Database administrator or Organizations: No    Attends Banker Meetings: Never    Marital Status: Married  Catering manager Violence: Not At Risk (07/25/2023)   Humiliation, Afraid, Rape, and Kick questionnaire    Fear of Current or Ex-Partner: No    Emotionally Abused: No    Physically Abused: No    Sexually Abused: No    Outpatient Medications Prior to Visit  Medication Sig Dispense Refill   acyclovir (ZOVIRAX) 400 MG tablet TAKE 1 TABLET BY MOUTH EVERY TWELVE HOURS (AT BREAKFAST AND AT BEDTIME) 90 tablet 1   alendronate (FOSAMAX) 70 MG tablet Take 1 tablet (70 mg total) by mouth every 7 (seven) days. Take with a full glass of water on an  empty stomach. 4 tablet 11   Apple Cider Vinegar 300 MG TABS Take by mouth.     aspirin 81 MG chewable tablet Chew 1 tablet (81 mg total) by mouth 2 (two) times daily. 60 tablet 0   atorvastatin (LIPITOR) 20 MG tablet TAKE 1 TABLET BY MOUTH AT BEDTIME 90 tablet 1   Blood Pressure Monitoring (BLOOD PRESSURE MONITOR/L CUFF) MISC Dx: R03.0, elevated bp in office 1 each 0   cholecalciferol (VITAMIN D3) 25 MCG (1000 UNIT) tablet Take 1,000 Units by mouth daily.     clonazePAM (KLONOPIN) 0.5 MG tablet TAKE 1 TABLET BY MOUTH AT BEDTIME 30 tablet 1  Continuous Glucose Sensor (DEXCOM G7 SENSOR) MISC Use to test BG 4+ times daily. Change sensor every 10 days. E11.65 3 each 1   donepezil (ARICEPT) 10 MG tablet TAKE 1 TABLET BY MOUTH EVERY DAY 90 tablet 1   EPINEPHrine 0.3 mg/0.3 mL IJ SOAJ injection Inject 0.3 mg into the muscle as needed for anaphylaxis. 1 each 2   escitalopram (LEXAPRO) 20 MG tablet TAKE 1 TABLET BY MOUTH EVERY DAY 30 tablet 2   glucose blood (TRUE METRIX BLOOD GLUCOSE TEST) test strip USE AS DIRECTED 100 each 2   hydrocortisone (ANUSOL-HC) 2.5 % rectal cream Place 1 Application rectally 2 (two) times daily. 30 g 0   insulin glargine (LANTUS) 100 UNIT/ML Solostar Pen Inject 40 Units into the skin at bedtime. 36 mL 3   insulin lispro (HUMALOG) 100 UNIT/ML KwikPen Inject 8-14 Units into the skin 3 (three) times daily. 36 mL 3   Insulin Pen Needle (BD AUTOSHIELD DUO) 30G X 5 MM MISC USE AS DIRECTED 100 each 2   levothyroxine (SYNTHROID) 88 MCG tablet Take 1 tablet (88 mcg total) by mouth daily. 90 tablet 1   loperamide (IMODIUM) 2 MG capsule Take 2 mg by mouth See admin instructions. Take 2 mg PO after each loose stool as needed for diarrhea (max of 3 doses in 24 hours)     methocarbamol (ROBAXIN) 500 MG tablet Take 1 tablet (500 mg total) by mouth 2 (two) times daily. 30 tablet 0   metoCLOPramide (REGLAN) 5 MG tablet TAKE 1 TABLET BY MOUTH FOUR TIMES DAILY 30 tablet 0   nystatin cream  (MYCOSTATIN) apply TO SKIN folds, UNDER breasts, abdominal folds, AND inguinal folds TWICE DAILY FOR candidasis 30 g 1   ondansetron (ZOFRAN) 4 MG tablet Take 1 tablet (4 mg total) by mouth every 6 (six) hours as needed. 30 tablet 2   polyethylene glycol powder (GLYCOLAX/MIRALAX) 17 GM/SCOOP powder SMARTSIG:1 Capful(s) By Mouth Daily     prazosin (MINIPRESS) 2 MG capsule TAKE ONE CAPSULE BY MOUTH AT BEDTIME 90 capsule 1   predniSONE (DELTASONE) 20 MG tablet Take 1 tablet (20 mg total) by mouth 2 (two) times daily with a meal. 10 tablet 0   Semaglutide (RYBELSUS) 14 MG TABS Take 1 tablet (14 mg total) by mouth daily. 90 tablet 3   senna (SENOKOT) 8.6 MG tablet Take 1 tablet (8.6 mg total) by mouth daily. 60 tablet 1   Specialty Vitamins Products (COLLAGEN ULTRA PO) Take by mouth.     Tetrahydrozoline-Zn Sulfate (EYE DROPS AR OP) Apply to eye. As needed     traZODone (DESYREL) 50 MG tablet TAKE 2 TABLETS BY MOUTH AT BEDTIME 30 tablet 5   trospium (SANCTURA) 20 MG tablet Take 1 tablet (20 mg total) by mouth 2 (two) times daily. 60 tablet 11   Vibegron (GEMTESA) 75 MG TABS Take 75 mg by mouth daily. 28 tablet 0   vitamin B-12 (CYANOCOBALAMIN) 1000 MCG tablet Take 1,000 mcg by mouth daily.     Vitamin D, Ergocalciferol, (DRISDOL) 1.25 MG (50000 UNIT) CAPS capsule TAKE ONE CAPSULE BY MOUTH ONCE WEEKLY ON TUESDAY 10 capsule 1   pantoprazole (PROTONIX) 40 MG tablet Take 1 tablet (40 mg total) by mouth daily. 30 tablet 11   No facility-administered medications prior to visit.    Allergies  Allergen Reactions   Ambien [Zolpidem Tartrate] Other (See Comments)    Hallucinations/nightmares   Bee Venom Swelling   Coconut Flavor Swelling    Mouth swells  Codeine Nausea And Vomiting   Onion Nausea Only and Other (See Comments)    Raw onions   Other     Ground Beef (patient avoid due to side effect of diarrhea)   Sulfa Antibiotics Rash and Other (See Comments)    Including topical sulfa  (mouth  dries/peels/skin blisters)    Review of Systems  Constitutional:  Negative for chills and fever.  Eyes:  Negative for visual disturbance.  Respiratory:  Negative for chest tightness and shortness of breath.   Gastrointestinal:  Positive for diarrhea.  Neurological:  Negative for dizziness and headaches.       Objective:    Physical Exam HENT:     Head: Normocephalic.     Mouth/Throat:     Mouth: Mucous membranes are moist.  Cardiovascular:     Rate and Rhythm: Normal rate.     Heart sounds: Normal heart sounds.  Pulmonary:     Effort: Pulmonary effort is normal.     Breath sounds: Normal breath sounds.  Abdominal:     General: There is no distension.     Tenderness: There is no abdominal tenderness.  Neurological:     Mental Status: She is alert.     BP 134/74   Pulse 66   Ht 5' 6.5" (1.689 m)   Wt 175 lb 1.9 oz (79.4 kg)   SpO2 95%   BMI 27.84 kg/m  Wt Readings from Last 3 Encounters:  10/01/23 175 lb 1.9 oz (79.4 kg)  08/06/23 174 lb (78.9 kg)  07/25/23 174 lb (78.9 kg)       Assessment & Plan:  Melena Assessment & Plan: Pending CBC and BMP today Encouraged to follow-up with GI as soon as possible   Orders: -     CBC -     BMP8+eGFR  Other acute gastritis without hemorrhage Assessment & Plan: Will increase Protonix to twice daily frequency Encouraged GERD diet For managing GERD, I recommend the following lifestyle changes:  Avoid Certain Foods and Drinks: Limit or eliminate coffee, chocolate, onions, peppermint, spicy foods, carbonated beverages, citrus fruits, tomatoes, garlic, alcohol, and fatty foods such as bacon, burgers, sausages, steak, fried foods, and dairy products.  Recommended Foods: Increase your intake of high-fiber foods including whole grain cereals, oatmeal, brown rice, root vegetables, and non-citrus fruits. Opt for high-protein foods and healthy fats such as avocados, olive oil, nuts, and seeds.    Orders: -     Pantoprazole  Sodium; Take 1 tablet (40 mg total) by mouth 2 (two) times daily.  Dispense: 90 tablet; Refill: 1  Overactive bladder Assessment & Plan: Will provide a 2-week sample of Myrbetriq 25 mg daily Encouraged the patient to verify with her insurance if the medication is covered and we will follow-up in 2 weeks to assess medication effectiveness    Note: This chart has been completed using Engineer, civil (consulting) software, and while attempts have been made to ensure accuracy, certain words and phrases may not be transcribed as intended.    Gilmore Laroche, FNP

## 2023-10-02 DIAGNOSIS — N3281 Overactive bladder: Secondary | ICD-10-CM | POA: Insufficient documentation

## 2023-10-02 NOTE — Assessment & Plan Note (Signed)
Will increase Protonix to twice daily frequency Encouraged GERD diet For managing GERD, I recommend the following lifestyle changes:  Avoid Certain Foods and Drinks: Limit or eliminate coffee, chocolate, onions, peppermint, spicy foods, carbonated beverages, citrus fruits, tomatoes, garlic, alcohol, and fatty foods such as bacon, burgers, sausages, steak, fried foods, and dairy products.  Recommended Foods: Increase your intake of high-fiber foods including whole grain cereals, oatmeal, brown rice, root vegetables, and non-citrus fruits. Opt for high-protein foods and healthy fats such as avocados, olive oil, nuts, and seeds.

## 2023-10-02 NOTE — Assessment & Plan Note (Signed)
Will provide a 2-week sample of Myrbetriq 25 mg daily Encouraged the patient to verify with her insurance if the medication is covered and we will follow-up in 2 weeks to assess medication effectiveness

## 2023-10-02 NOTE — Assessment & Plan Note (Addendum)
Pending CBC and BMP today Encouraged to follow-up with GI as soon as possible

## 2023-10-03 ENCOUNTER — Encounter: Payer: Self-pay | Admitting: Internal Medicine

## 2023-10-04 ENCOUNTER — Other Ambulatory Visit: Payer: Self-pay | Admitting: Family Medicine

## 2023-10-04 DIAGNOSIS — M810 Age-related osteoporosis without current pathological fracture: Secondary | ICD-10-CM

## 2023-10-04 DIAGNOSIS — E559 Vitamin D deficiency, unspecified: Secondary | ICD-10-CM

## 2023-10-06 ENCOUNTER — Other Ambulatory Visit: Payer: Self-pay | Admitting: Urology

## 2023-10-07 NOTE — Telephone Encounter (Signed)
Pt scheduled for follow up.  Left voicemail requesting call back to confirm appt before sending refill.

## 2023-10-14 ENCOUNTER — Other Ambulatory Visit: Payer: Self-pay | Admitting: Family Medicine

## 2023-10-14 NOTE — Telephone Encounter (Signed)
Unable to reach patient by phone, wrong number in chart.  Pt is scheduled for a follow up.

## 2023-10-15 ENCOUNTER — Encounter: Payer: Self-pay | Admitting: Family Medicine

## 2023-10-15 ENCOUNTER — Other Ambulatory Visit (HOSPITAL_COMMUNITY): Payer: Self-pay

## 2023-10-15 ENCOUNTER — Ambulatory Visit (INDEPENDENT_AMBULATORY_CARE_PROVIDER_SITE_OTHER): Payer: Medicare Other | Admitting: Family Medicine

## 2023-10-15 VITALS — BP 94/48 | HR 81 | Ht 66.5 in | Wt 175.0 lb

## 2023-10-15 DIAGNOSIS — F32A Depression, unspecified: Secondary | ICD-10-CM | POA: Insufficient documentation

## 2023-10-15 DIAGNOSIS — F3289 Other specified depressive episodes: Secondary | ICD-10-CM | POA: Diagnosis not present

## 2023-10-15 DIAGNOSIS — N3281 Overactive bladder: Secondary | ICD-10-CM

## 2023-10-15 MED ORDER — MIRABEGRON ER 25 MG PO TB24
25.0000 mg | ORAL_TABLET | Freq: Every day | ORAL | 1 refills | Status: DC
Start: 2023-10-15 — End: 2023-12-05

## 2023-10-15 NOTE — Assessment & Plan Note (Signed)
Referral placed to integrated behavioral health for talk therapy Discussed nonpharmacological intervention such as mindfulness, meditation, and deep breathing exercises Patient and/or legal guardian verbally consented to [facility] services about presenting concerns and psychiatric consultation as appropriate.  The services will be billed as appropriate for the patient
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
## 2023-10-15 NOTE — Patient Instructions (Addendum)
.  I appreciate the opportunity to provide care to you today!    Follow up:  3 months  [facility] number: [phone]  Nonpharmacological interventions for managing overactive bladder (OAB) can significantly improve symptoms and quality of life. Here are several effective strategies: -Pelvic Floor Exercises (Kegel Exercises) Strengthening the pelvic floor muscles can help improve bladder control. To perform Kegel exercises, contract the pelvic floor muscles (as if stopping the flow of urine), hold for a few seconds, then relax. Repeat several times a day. -Fluid Management Monitor Fluid Intake: While staying hydrated is essential, be mindful of the types and timing of fluid intake. Reduce consumption of diuretics like caffeine and alcohol, especially in the evening. Evening Fluid Restriction: Limit fluid intake a few hours before bedtime to minimize nighttime trips to the bathroom. -Dietary Modifications Avoid bladder irritants such as spicy foods, citrus fruits, artificial sweeteners, and carbonated beverages that may exacerbate symptoms.       Please continue to a heart-healthy diet and increase your physical activities. Try to exercise for at least five days a week.    It was a pleasure to see you and I look forward to continuing to work together on your health and well-being. Please do not hesitate to call the office if you need care or have questions about your care.  In case of emergency, please visit the Emergency Department for urgent care, or contact our clinic at [phone] to schedule an appointment. We're here to help you!   Have a wonderful day and week. With Gratitude, Gilmore Laroche MSN, FNP-BC

## 2023-10-15 NOTE — Assessment & Plan Note (Signed)
Rx sent to the pharmacy Encouraged to continue taking Myrbetriq 25 mg daily

## 2023-10-17 ENCOUNTER — Ambulatory Visit: Payer: 59 | Admitting: Nurse Practitioner

## 2023-10-17 DIAGNOSIS — E039 Hypothyroidism, unspecified: Secondary | ICD-10-CM

## 2023-10-17 DIAGNOSIS — E119 Type 2 diabetes mellitus without complications: Secondary | ICD-10-CM

## 2023-10-17 DIAGNOSIS — E559 Vitamin D deficiency, unspecified: Secondary | ICD-10-CM

## 2023-10-17 DIAGNOSIS — Z794 Long term (current) use of insulin: Secondary | ICD-10-CM

## 2023-10-17 DIAGNOSIS — Z7984 Long term (current) use of oral hypoglycemic drugs: Secondary | ICD-10-CM

## 2023-10-22 ENCOUNTER — Ambulatory Visit (HOSPITAL_COMMUNITY)
Admission: RE | Admit: 2023-10-22 | Discharge: 2023-10-22 | Disposition: A | Discharge status: Home / Self Care | Payer: Medicare Other | Source: Ambulatory Visit | Attending: Family Medicine | Admitting: Family Medicine

## 2023-10-22 ENCOUNTER — Encounter (HOSPITAL_COMMUNITY): Payer: Self-pay

## 2023-10-22 DIAGNOSIS — N6315 Unspecified lump in the right breast, overlapping quadrants: Secondary | ICD-10-CM | POA: Diagnosis not present

## 2023-10-22 DIAGNOSIS — N63 Unspecified lump in unspecified breast: Secondary | ICD-10-CM

## 2023-10-22 DIAGNOSIS — N6310 Unspecified lump in the right breast, unspecified quadrant: Secondary | ICD-10-CM | POA: Diagnosis not present

## 2023-10-22 DIAGNOSIS — R92313 Mammographic fatty tissue density, bilateral breasts: Secondary | ICD-10-CM | POA: Diagnosis not present

## 2023-10-30 ENCOUNTER — Other Ambulatory Visit: Payer: Self-pay

## 2023-10-30 MED ORDER — TROSPIUM CHLORIDE 20 MG PO TABS: 20.0000 mg | ORAL_TABLET | Freq: Two times a day (BID) | ORAL | 0 refills | Status: DC

## 2023-10-30 NOTE — Telephone Encounter (Signed)
Refilled to get patient to next appt per MD.  Unable to reach patient to confirm appt, after multiple attemtps . Patient will need visit for future refills.

## 2023-11-04 ENCOUNTER — Other Ambulatory Visit: Payer: Self-pay | Admitting: Family Medicine

## 2023-11-04 ENCOUNTER — Other Ambulatory Visit: Payer: Self-pay | Admitting: Nurse Practitioner

## 2023-11-04 DIAGNOSIS — E119 Type 2 diabetes mellitus without complications: Secondary | ICD-10-CM

## 2023-11-04 DIAGNOSIS — B009 Herpesviral infection, unspecified: Secondary | ICD-10-CM

## 2023-11-04 DIAGNOSIS — F419 Anxiety disorder, unspecified: Secondary | ICD-10-CM

## 2023-11-04 DIAGNOSIS — F515 Nightmare disorder: Secondary | ICD-10-CM

## 2023-11-04 DIAGNOSIS — E7849 Other hyperlipidemia: Secondary | ICD-10-CM

## 2023-11-04 DIAGNOSIS — F03A4 Unspecified dementia, mild, with anxiety: Secondary | ICD-10-CM

## 2023-11-08 ENCOUNTER — Ambulatory Visit: Payer: 59 | Admitting: Family Medicine

## 2023-11-11 DIAGNOSIS — K921 Melena: Secondary | ICD-10-CM | POA: Diagnosis not present

## 2023-11-12 LAB — CBC
Hematocrit: 35.9 % (ref 34.0–46.6)
Hemoglobin: 11.6 g/dL (ref 11.1–15.9)
MCH: 28.4 pg (ref 26.6–33.0)
MCHC: 32.3 g/dL (ref 31.5–35.7)
MCV: 88 fL (ref 79–97)
Platelets: 181 10*3/uL (ref 150–450)
RBC: 4.08 x10E6/uL (ref 3.77–5.28)
RDW: 14 % (ref 11.7–15.4)
WBC: 4.8 10*3/uL (ref 3.4–10.8)

## 2023-11-12 LAB — BMP8+EGFR
BUN/Creatinine Ratio: 19 (ref 12–28)
BUN: 14 mg/dL (ref 8–27)
CO2: 23 mmol/L (ref 20–29)
Calcium: 9.2 mg/dL (ref 8.7–10.3)
Chloride: 102 mmol/L (ref 96–106)
Creatinine, Ser: 0.73 mg/dL (ref 0.57–1.00)
Glucose: 102 mg/dL — ABNORMAL HIGH (ref 70–99)
Potassium: 3.9 mmol/L (ref 3.5–5.2)
Sodium: 139 mmol/L (ref 134–144)
eGFR: 86 mL/min/{1.73_m2} (ref >59)

## 2023-11-14 ENCOUNTER — Encounter: Payer: Self-pay | Admitting: Internal Medicine

## 2023-11-14 ENCOUNTER — Ambulatory Visit (INDEPENDENT_AMBULATORY_CARE_PROVIDER_SITE_OTHER): Payer: Medicare Other | Admitting: Internal Medicine

## 2023-11-14 VITALS — BP 137/79 | HR 75 | Temp 97.8°F | Ht 67.0 in | Wt 171.8 lb

## 2023-11-14 DIAGNOSIS — Z860101 Personal history of adenomatous and serrated colon polyps: Secondary | ICD-10-CM | POA: Diagnosis not present

## 2023-11-14 DIAGNOSIS — R197 Diarrhea, unspecified: Secondary | ICD-10-CM | POA: Diagnosis not present

## 2023-11-14 DIAGNOSIS — K219 Gastro-esophageal reflux disease without esophagitis: Secondary | ICD-10-CM | POA: Diagnosis not present

## 2023-11-14 NOTE — Patient Instructions (Signed)
I am happy to hear that you are doing well.  Continue on pantoprazole for your chronic acid reflux.  Continue Imodium as needed for your bowels.  Follow-up in 6 months or sooner if needed.  Your new granddaughter is beautiful.  Congratulations!  Dr. Marletta Lor

## 2023-11-14 NOTE — Progress Notes (Signed)
Primary Care Physician:  Gilmore Laroche, FNP Primary Gastroenterologist:  Dr. Marletta Lor  Chief Complaint  Patient presents with   Follow-up    Follow up on diarrhea. Pt states it is better    HPI:   [name] is a 72 y.o. female who presents to the clinic today for follow-up visit.  Previously seen for diarrhea, GERD poor appetite.  Celiac testing WNL.  EGD 08/06/2023 normal esophagus, mild gastritis, biopsies negative for H. pylori.  Colonoscopy 08/06/2023 with internal hemorrhoids, sigmoid diverticulosis, 2 polyps removed, normal terminal ileum.  Polyps tubular adenomas, 5-year recall.  Biopsies negative for microscopic colitis.  Today, states she is doing well.  Continues to take Imodium as needed for breakthrough symptoms of diarrhea.  She is altered her diet which has made a significant difference.  Reflux well-controlled on pantoprazole.  Past Medical History:  Diagnosis Date   Anemia    as a teenager   Anxiety    Arthritis    knees, rt hand,hips   Cancer (HCC)    vaginal,uterine,ovariam   COPD (chronic obstructive pulmonary disease) (HCC)    Depression    Diabetes mellitus without complication (HCC)    Hypothyroidism    Neuromuscular disorder (HCC)    hands and feet    Past Surgical History:  Procedure Laterality Date   ABDOMINAL HYSTERECTOMY     BIOPSY  08/06/2023   Procedure: BIOPSY;  Surgeon: [name], DO;  Location: [facility];  Service: Endoscopy;;   BREAST SURGERY Right 1988   lumpectomy   COLONOSCOPY WITH PROPOFOL N/A 08/06/2023   Procedure: COLONOSCOPY WITH PROPOFOL;  Surgeon: [name], DO;  Location: [facility];  Service: Endoscopy;  Laterality: N/A;  900am, asa 2   ESOPHAGOGASTRODUODENOSCOPY (EGD) WITH PROPOFOL N/A 08/06/2023   Procedure: ESOPHAGOGASTRODUODENOSCOPY (EGD) WITH PROPOFOL;  Surgeon: [name], DO;  Location: [facility];  Service: Endoscopy;  Laterality: N/A;   EYE SURGERY Bilateral    FRACTURE  SURGERY     Rt ankle,lt knee cap,lt shoulder,rt wrist,rt shoulder,   POLYPECTOMY  08/06/2023   Procedure: POLYPECTOMY;  Surgeon: [name], DO;  Location: [facility];  Service: Endoscopy;;   TONSILLECTOMY     at age 35   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   Procedure: TOTAL KNEE ARTHROPLASTY;  Surgeon: Frederico Hamman, MD;  Location: [facility];  Service: Orthopedics;  Laterality: Right;    Current Outpatient Medications  Medication Sig Dispense Refill   acyclovir (ZOVIRAX) 400 MG tablet TAKE 1 TABLET BY MOUTH EVERY TWELVE HOURS (AT BREAKFAST AND AT BEDTIME) 90 tablet 1   alendronate (FOSAMAX) 70 MG tablet TAKE 1 TABLET BY MOUTH ONCE A WEEK, TAKE with full GLASS of water ON an EMPTY stomach 4 tablet 11   Apple Cider Vinegar 300 MG TABS Take by mouth.     aspirin 81 MG chewable tablet Chew 1 tablet (81 mg total) by mouth 2 (two) times daily. 60 tablet 0   atorvastatin (LIPITOR) 20 MG tablet TAKE 1 TABLET BY MOUTH AT BEDTIME 90 tablet 1   Blood Pressure Monitoring (BLOOD PRESSURE MONITOR/L CUFF) MISC Dx: R03.0, elevated bp in office 1 each 0   cholecalciferol (VITAMIN D3) 25 MCG (1000 UNIT) tablet Take 1,000 Units by mouth daily.     clonazePAM (KLONOPIN) 0.5 MG tablet TAKE 1 TABLET BY MOUTH AT BEDTIME 30 tablet 1   Continuous Glucose Sensor (DEXCOM G7 SENSOR) MISC USE AS DIRECTED TO test BLOOD SUGAR FOUR  TIMES DAILY. CHANGE sensor EVERY 10 DAYS 3 each 1   donepezil (ARICEPT) 10 MG tablet TAKE 1 TABLET BY MOUTH EVERY DAY 90 tablet 1   EPINEPHrine 0.3 mg/0.3 mL IJ SOAJ injection Inject 0.3 mg into the muscle as needed for anaphylaxis. 1 each 2   escitalopram (LEXAPRO) 20 MG tablet TAKE 1 TABLET BY MOUTH EVERY DAY 30 tablet 2   glucose blood (TRUE METRIX BLOOD GLUCOSE TEST) test strip USE AS DIRECTED 100 each 2   hydrocortisone (ANUSOL-HC) 2.5 % rectal cream Place 1 Application rectally 2 (two) times daily. 30 g 0   insulin glargine (LANTUS) 100 UNIT/ML Solostar Pen Inject 40 Units  into the skin at bedtime. 36 mL 3   insulin lispro (HUMALOG) 100 UNIT/ML KwikPen Inject 8-14 Units into the skin 3 (three) times daily. 36 mL 3   Insulin Pen Needle (BD AUTOSHIELD DUO) 30G X 5 MM MISC USE AS DIRECTED 100 each 2   levothyroxine (SYNTHROID) 88 MCG tablet Take 1 tablet (88 mcg total) by mouth daily. 90 tablet 1   loperamide (IMODIUM) 2 MG capsule Take 2 mg by mouth See admin instructions. Take 2 mg PO after each loose stool as needed for diarrhea (max of 3 doses in 24 hours)     methocarbamol (ROBAXIN) 500 MG tablet Take 1 tablet (500 mg total) by mouth 2 (two) times daily. 30 tablet 0   metoCLOPramide (REGLAN) 5 MG tablet TAKE 1 TABLET BY MOUTH FOUR TIMES DAILY 30 tablet 0   mirabegron ER (MYRBETRIQ) 25 MG TB24 tablet Take 1 tablet (25 mg total) by mouth daily. 30 tablet 1   nystatin cream (MYCOSTATIN) apply TO SKIN folds, UNDER breasts, abdominal folds, AND inguinal folds TWICE DAILY FOR candidasis 30 g 1   ondansetron (ZOFRAN) 4 MG tablet Take 1 tablet (4 mg total) by mouth every 6 (six) hours as needed. 30 tablet 2   pantoprazole (PROTONIX) 40 MG tablet Take 1 tablet (40 mg total) by mouth 2 (two) times daily. 90 tablet 1   polyethylene glycol powder (GLYCOLAX/MIRALAX) 17 GM/SCOOP powder SMARTSIG:1 Capful(s) By Mouth Daily     prazosin (MINIPRESS) 2 MG capsule TAKE ONE CAPSULE BY MOUTH AT BEDTIME 90 capsule 1   predniSONE (DELTASONE) 20 MG tablet Take 1 tablet (20 mg total) by mouth 2 (two) times daily with a meal. 10 tablet 0   Semaglutide (RYBELSUS) 14 MG TABS Take 1 tablet (14 mg total) by mouth daily. 90 tablet 3   senna (SENOKOT) 8.6 MG tablet Take 1 tablet (8.6 mg total) by mouth daily. 60 tablet 1   Specialty Vitamins Products (COLLAGEN ULTRA PO) Take by mouth.     Tetrahydrozoline-Zn Sulfate (EYE DROPS AR OP) Apply to eye. As needed     traZODone (DESYREL) 50 MG tablet TAKE 2 TABLETS BY MOUTH AT BEDTIME 30 tablet 5   trospium (SANCTURA) 20 MG tablet Take 1 tablet (20 mg  total) by mouth 2 (two) times daily. 120 tablet 0   Vibegron (GEMTESA) 75 MG TABS Take 75 mg by mouth daily. 28 tablet 0   vitamin B-12 (CYANOCOBALAMIN) 1000 MCG tablet Take 1,000 mcg by mouth daily.     Vitamin D, Ergocalciferol, 50000 units CAPS TAKE ONE CAPSULE BY MOUTH ONCE WEEKLY ON TUESDAY 10 capsule 1   No current facility-administered medications for this visit.    Allergies as of 11/14/2023 - Review Complete 11/14/2023  Allergen Reaction Noted   Ambien [zolpidem tartrate] Other (See Comments) 09/01/2019   Bee  venom Swelling 05/22/2022   Coconut flavor Swelling 09/01/2019   Codeine Nausea And Vomiting 09/01/2019   Onion Nausea Only and Other (See Comments) 09/01/2019   Other  09/01/2019   Sulfa antibiotics Rash and Other (See Comments) 09/01/2019    Family History  Problem Relation Age of Onset   Cancer Mother    Hypertension Mother    Thyroid disease Mother    Diabetes Mother    Stroke Mother    Heart failure Mother    Diabetes Father    Heart attack Father    Diabetes Sister    Diabetes Brother     Social History   Socioeconomic History   Marital status: Married    Spouse name: Not on file   Number of children: Not on file   Years of education: Not on file   Highest education level: Not on file  Occupational History   Not on file  Tobacco Use   Smoking status: Former    Current packs/day: 0.00    Average packs/day: 0.5 packs/day for 15.0 years (7.5 ttl pk-yrs)    Types: Cigarettes    Start date: 33    Quit date: 2013    Years since quitting: 11.8   Smokeless tobacco: Never  Vaping Use   Vaping status: Never Used  Substance and Sexual Activity   Alcohol use: Never   Drug use: Never   Sexual activity: Not on file  Other Topics Concern   Not on file  Social History Narrative   Not on file   Social Determinants of Health   Financial Resource Strain: Low Risk  (07/25/2023)   Overall Financial Resource Strain (CARDIA)    Difficulty of Paying  Living Expenses: Not hard at all  Food Insecurity: No Food Insecurity (07/25/2023)   Hunger Vital Sign    Worried About Running Out of Food in the Last Year: Never true    Ran Out of Food in the Last Year: Never true  Transportation Needs: No Transportation Needs (07/25/2023)   PRAPARE - Administrator, Civil Service (Medical): No    Lack of Transportation (Non-Medical): No  Physical Activity: Sufficiently Active (07/25/2023)   Exercise Vital Sign    Days of Exercise per Week: 7 days    Minutes of Exercise per Session: 30 min  Stress: Stress Concern Present (07/25/2023)   [name] [employer] - Occupational Stress Questionnaire    Feeling of Stress : Very much  Social Connections: Moderately Isolated (07/25/2023)   Social Connection and Isolation Panel [NHANES]    Frequency of Communication with Friends and Family: More than three times a week    Frequency of Social Gatherings with Friends and Family: More than three times a week    Attends Religious Services: Never    Database administrator or Organizations: No    Attends Banker Meetings: Never    Marital Status: Married  Catering manager Violence: Not At Risk (07/25/2023)   Humiliation, Afraid, Rape, and Kick questionnaire    Fear of Current or Ex-Partner: No    Emotionally Abused: No    Physically Abused: No    Sexually Abused: No    Subjective: Review of Systems  Constitutional:  Negative for chills and fever.  HENT:  Negative for congestion and hearing loss.   Eyes:  Negative for blurred vision and double vision.  Respiratory:  Negative for cough and shortness of breath.   Cardiovascular:  Negative for chest pain and  palpitations.  Gastrointestinal:  Positive for diarrhea and melena. Negative for abdominal pain, blood in stool, constipation, heartburn and vomiting.  Genitourinary:  Negative for dysuria and urgency.  Musculoskeletal:  Negative for joint pain and myalgias.  Skin:  Negative  for itching and rash.  Neurological:  Negative for dizziness and headaches.  Psychiatric/Behavioral:  Negative for depression. The patient is not nervous/anxious.        Objective: BP 137/79   Pulse 75   Temp 97.8 F (36.6 C)   Ht 5\' 7"  (1.702 m)   Wt 171 lb 12.8 oz (77.9 kg)   BMI 26.91 kg/m  Physical Exam Constitutional:      Appearance: Normal appearance.  HENT:     Head: Normocephalic and atraumatic.  Eyes:     Extraocular Movements: Extraocular movements intact.     Conjunctiva/sclera: Conjunctivae normal.  Cardiovascular:     Rate and Rhythm: Normal rate and regular rhythm.  Pulmonary:     Effort: Pulmonary effort is normal.     Breath sounds: Normal breath sounds.  Abdominal:     General: Bowel sounds are normal.     Palpations: Abdomen is soft.  Musculoskeletal:        General: No swelling. Normal range of motion.     Cervical back: Normal range of motion and neck supple.  Skin:    General: Skin is warm and dry.     Coloration: Skin is not jaundiced.  Neurological:     General: No focal deficit present.     Mental Status: She is alert and oriented to person, place, and time.  Psychiatric:        Mood and Affect: Mood normal.        Behavior: Behavior normal.      Assessment/Plan:  1.  Chronic GERD-well-controlled on pantoprazole.  Will continue.  2.  Change in bowels, diarrhea-improved.  Continue Imodium as needed.  3.  Adenomatous colon polyps-discussed recent colonoscopy results with patient today.  Can weigh risk versus benefits of surveillance colonoscopy in 5 years.  Follow-up in 6 months or sooner if needed.  11/14/2023 9:51 AM   Disclaimer: This note was dictated with voice recognition software. Similar sounding words can inadvertently be transcribed and may not be corrected upon review.

## 2023-11-14 NOTE — Progress Notes (Signed)
Please inform the patient that her labs are stable. Wishing her a happy thanksgiving.

## 2023-11-18 ENCOUNTER — Ambulatory Visit (INDEPENDENT_AMBULATORY_CARE_PROVIDER_SITE_OTHER): Payer: Medicare Other | Admitting: Nurse Practitioner

## 2023-11-18 ENCOUNTER — Encounter: Payer: Self-pay | Admitting: Nurse Practitioner

## 2023-11-18 VITALS — BP 138/76 | HR 61 | Ht 67.0 in | Wt 171.4 lb

## 2023-11-18 DIAGNOSIS — E559 Vitamin D deficiency, unspecified: Secondary | ICD-10-CM

## 2023-11-18 DIAGNOSIS — Z794 Long term (current) use of insulin: Secondary | ICD-10-CM | POA: Diagnosis not present

## 2023-11-18 DIAGNOSIS — Z7984 Long term (current) use of oral hypoglycemic drugs: Secondary | ICD-10-CM

## 2023-11-18 DIAGNOSIS — E119 Type 2 diabetes mellitus without complications: Secondary | ICD-10-CM

## 2023-11-18 DIAGNOSIS — E039 Hypothyroidism, unspecified: Secondary | ICD-10-CM | POA: Diagnosis not present

## 2023-11-18 LAB — POCT GLYCOSYLATED HEMOGLOBIN (HGB A1C): Hemoglobin A1C: 5.8 % — AB (ref 4.0–5.6)

## 2023-11-18 MED ORDER — INSULIN LISPRO (1 UNIT DIAL) 100 UNIT/ML (KWIKPEN): 6.0000 [IU] | PEN_INJECTOR | Freq: Three times a day (TID) | SUBCUTANEOUS | 3 refills | Status: DC

## 2023-11-18 MED ORDER — INSULIN GLARGINE 100 UNIT/ML SOLOSTAR PEN
35.0000 [IU] | PEN_INJECTOR | Freq: Every day | SUBCUTANEOUS | 3 refills | Status: DC
Start: 2023-11-18 — End: 2024-03-17

## 2023-11-18 NOTE — Progress Notes (Signed)
Endocrinology Follow Up Note       11/18/2023, 9:42 AM   Subjective:    Patient ID: [name], female    DOB: March 22, 1951.  [name] is being seen in follow up after being seen in consultation for management of currently uncontrolled symptomatic diabetes requested by  Gilmore Laroche, FNP.   Past Medical History:  Diagnosis Date   Anemia    as a teenager   Anxiety    Arthritis    knees, rt hand,hips   Cancer (HCC)    vaginal,uterine,ovariam   COPD (chronic obstructive pulmonary disease) (HCC)    Depression    Diabetes mellitus without complication (HCC)    Hypothyroidism    Neuromuscular disorder (HCC)    hands and feet    Past Surgical History:  Procedure Laterality Date   ABDOMINAL HYSTERECTOMY     BIOPSY  08/06/2023   Procedure: BIOPSY;  Surgeon: [name], DO;  Location: [facility];  Service: Endoscopy;;   BREAST SURGERY Right 1988   lumpectomy   COLONOSCOPY WITH PROPOFOL N/A 08/06/2023   Procedure: COLONOSCOPY WITH PROPOFOL;  Surgeon: [name], DO;  Location: [facility];  Service: Endoscopy;  Laterality: N/A;  900am, asa 2   ESOPHAGOGASTRODUODENOSCOPY (EGD) WITH PROPOFOL N/A 08/06/2023   Procedure: ESOPHAGOGASTRODUODENOSCOPY (EGD) WITH PROPOFOL;  Surgeon: [name], DO;  Location: [facility];  Service: Endoscopy;  Laterality: N/A;   EYE SURGERY Bilateral    FRACTURE SURGERY     Rt ankle,lt knee cap,lt shoulder,rt wrist,rt shoulder,   POLYPECTOMY  08/06/2023   Procedure: POLYPECTOMY;  Surgeon: [name], DO;  Location: [facility];  Service: Endoscopy;;   TONSILLECTOMY     at age 21   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   Procedure: TOTAL KNEE ARTHROPLASTY;  Surgeon: Frederico Hamman, MD;  Location: [facility];  Service: Orthopedics;  Laterality: Right;    Social History   Socioeconomic History   Marital status: Married    Spouse  name: Not on file   Number of children: Not on file   Years of education: Not on file   Highest education level: Not on file  Occupational History   Not on file  Tobacco Use   Smoking status: Former    Current packs/day: 0.00    Average packs/day: 0.5 packs/day for 15.0 years (7.5 ttl pk-yrs)    Types: Cigarettes    Start date: 69    Quit date: 2013    Years since quitting: 11.9   Smokeless tobacco: Never  Vaping Use   Vaping status: Never Used  Substance and Sexual Activity   Alcohol use: Never   Drug use: Never   Sexual activity: Not on file  Other Topics Concern   Not on file  Social History Narrative   Not on file   Social Determinants of Health   Financial Resource Strain: Low Risk  (07/25/2023)   Overall Financial Resource Strain (CARDIA)    Difficulty of Paying Living Expenses: Not hard at all  Food Insecurity: No Food Insecurity (07/25/2023)   Hunger Vital Sign    Worried About Running Out of Food in the Last Year:  Never true    Ran Out of Food in the Last Year: Never true  Transportation Needs: No Transportation Needs (07/25/2023)   PRAPARE - Administrator, Civil Service (Medical): No    Lack of Transportation (Non-Medical): No  Physical Activity: Sufficiently Active (07/25/2023)   Exercise Vital Sign    Days of Exercise per Week: 7 days    Minutes of Exercise per Session: 30 min  Stress: Stress Concern Present (07/25/2023)   [employer] of Occupational Health - Occupational Stress Questionnaire    Feeling of Stress : Very much  Social Connections: Moderately Isolated (07/25/2023)   Social Connection and Isolation Panel [NHANES]    Frequency of Communication with Friends and Family: More than three times a week    Frequency of Social Gatherings with Friends and Family: More than three times a week    Attends Religious Services: Never    Database administrator or Organizations: No    Attends Engineer, structural: Never    Marital Status:  Married    Family History  Problem Relation Age of Onset   Cancer Mother    Hypertension Mother    Thyroid disease Mother    Diabetes Mother    Stroke Mother    Heart failure Mother    Diabetes Father    Heart attack Father    Diabetes Sister    Diabetes Brother     Outpatient Encounter Medications as of 11/18/2023  Medication Sig   acyclovir (ZOVIRAX) 400 MG tablet TAKE 1 TABLET BY MOUTH EVERY TWELVE HOURS (AT BREAKFAST AND AT BEDTIME)   alendronate (FOSAMAX) 70 MG tablet TAKE 1 TABLET BY MOUTH ONCE A WEEK, TAKE with full GLASS of water ON an EMPTY stomach   Apple Cider Vinegar 300 MG TABS Take by mouth.   aspirin 81 MG chewable tablet Chew 1 tablet (81 mg total) by mouth 2 (two) times daily.   atorvastatin (LIPITOR) 20 MG tablet TAKE 1 TABLET BY MOUTH AT BEDTIME   Blood Pressure Monitoring (BLOOD PRESSURE MONITOR/L CUFF) MISC Dx: R03.0, elevated bp in office   cholecalciferol (VITAMIN D3) 25 MCG (1000 UNIT) tablet Take 1,000 Units by mouth daily.   Continuous Glucose Sensor (DEXCOM G7 SENSOR) MISC USE AS DIRECTED TO test BLOOD SUGAR FOUR TIMES DAILY. CHANGE sensor EVERY 10 DAYS   donepezil (ARICEPT) 10 MG tablet TAKE 1 TABLET BY MOUTH EVERY DAY   EPINEPHrine 0.3 mg/0.3 mL IJ SOAJ injection Inject 0.3 mg into the muscle as needed for anaphylaxis.   escitalopram (LEXAPRO) 20 MG tablet TAKE 1 TABLET BY MOUTH EVERY DAY   glucose blood (TRUE METRIX BLOOD GLUCOSE TEST) test strip USE AS DIRECTED   hydrocortisone (ANUSOL-HC) 2.5 % rectal cream Place 1 Application rectally 2 (two) times daily.   Insulin Pen Needle (BD AUTOSHIELD DUO) 30G X 5 MM MISC USE AS DIRECTED   levothyroxine (SYNTHROID) 88 MCG tablet Take 1 tablet (88 mcg total) by mouth daily.   loperamide (IMODIUM) 2 MG capsule Take 2 mg by mouth See admin instructions. Take 2 mg PO after each loose stool as needed for diarrhea (max of 3 doses in 24 hours)   methocarbamol (ROBAXIN) 500 MG tablet Take 1 tablet (500 mg total) by  mouth 2 (two) times daily.   mirabegron ER (MYRBETRIQ) 25 MG TB24 tablet Take 1 tablet (25 mg total) by mouth daily.   nystatin cream (MYCOSTATIN) apply TO SKIN folds, UNDER breasts, abdominal folds, AND inguinal folds TWICE DAILY  FOR candidasis   pantoprazole (PROTONIX) 40 MG tablet Take 1 tablet (40 mg total) by mouth 2 (two) times daily.   polyethylene glycol powder (GLYCOLAX/MIRALAX) 17 GM/SCOOP powder SMARTSIG:1 Capful(s) By Mouth Daily   prazosin (MINIPRESS) 2 MG capsule TAKE ONE CAPSULE BY MOUTH AT BEDTIME   predniSONE (DELTASONE) 20 MG tablet Take 1 tablet (20 mg total) by mouth 2 (two) times daily with a meal.   Semaglutide (RYBELSUS) 14 MG TABS Take 1 tablet (14 mg total) by mouth daily.   Specialty Vitamins Products (COLLAGEN ULTRA PO) Take by mouth.   Tetrahydrozoline-Zn Sulfate (EYE DROPS AR OP) Apply to eye. As needed   traZODone (DESYREL) 50 MG tablet TAKE 2 TABLETS BY MOUTH AT BEDTIME   trospium (SANCTURA) 20 MG tablet Take 1 tablet (20 mg total) by mouth 2 (two) times daily.   Vibegron (GEMTESA) 75 MG TABS Take 75 mg by mouth daily.   vitamin B-12 (CYANOCOBALAMIN) 1000 MCG tablet Take 1,000 mcg by mouth daily.   Vitamin D, Ergocalciferol, 50000 units CAPS TAKE ONE CAPSULE BY MOUTH ONCE WEEKLY ON TUESDAY   [DISCONTINUED] insulin glargine (LANTUS) 100 UNIT/ML Solostar Pen Inject 40 Units into the skin at bedtime.   [DISCONTINUED] insulin lispro (HUMALOG) 100 UNIT/ML KwikPen Inject 8-14 Units into the skin 3 (three) times daily.   insulin glargine (LANTUS) 100 UNIT/ML Solostar Pen Inject 35 Units into the skin at bedtime.   insulin lispro (HUMALOG) 100 UNIT/ML KwikPen Inject 6-12 Units into the skin 3 (three) times daily.   [DISCONTINUED] clonazePAM (KLONOPIN) 0.5 MG tablet TAKE 1 TABLET BY MOUTH AT BEDTIME   [DISCONTINUED] metoCLOPramide (REGLAN) 5 MG tablet TAKE 1 TABLET BY MOUTH FOUR TIMES DAILY   [DISCONTINUED] ondansetron (ZOFRAN) 4 MG tablet Take 1 tablet (4 mg total) by  mouth every 6 (six) hours as needed.   [DISCONTINUED] senna (SENOKOT) 8.6 MG tablet Take 1 tablet (8.6 mg total) by mouth daily.   No facility-administered encounter medications on file as of 11/18/2023.    ALLERGIES: Allergies  Allergen Reactions   Ambien [Zolpidem Tartrate] Other (See Comments)    Hallucinations/nightmares   Bee Venom Swelling   Coconut Flavor Swelling    Mouth swells   Codeine Nausea And Vomiting   Onion Nausea Only and Other (See Comments)    Raw onions   Other     Ground Beef (patient avoid due to side effect of diarrhea)   Sulfa Antibiotics Rash and Other (See Comments)    Including topical sulfa  (mouth dries/peels/skin blisters)    VACCINATION STATUS: Immunization History  Administered Date(s) Administered   Fluad Quad(high Dose 65+) 12/03/2022   Pneumococcal Polysaccharide-23 08/22/2022   Tdap 05/29/2005, 05/22/2022   Zoster Recombinant(Shingrix) 05/22/2022    Diabetes She presents for her follow-up diabetic visit. She has type 2 diabetes mellitus. Onset time: Diagnosed at approx age of 36. Her disease course has been improving. There are no hypoglycemic associated symptoms. Associated symptoms include foot paresthesias and weight loss. There are no hypoglycemic complications. Symptoms are stable. There are no diabetic complications. Risk factors for coronary artery disease include diabetes mellitus, family history, obesity, sedentary lifestyle and post-menopausal. Current diabetic treatment includes intensive insulin program and oral agent (monotherapy). She is compliant with treatment most of the time. Her weight is decreasing steadily. She is following a generally healthy diet. Meal planning includes avoidance of concentrated sweets. She has not had a previous visit with a dietitian. She participates in exercise daily. Her home blood glucose trend is  decreasing steadily. Her overall blood glucose range is 110-130 mg/dl. (She presents today with her CGM  showing at goal glycemic profile.  Her POCT A1c today is 5.8%, improving from last visit of 6.2%.  Analysis of her CGM shows TIR 97%, TAR 3%, TBR 0% with a GMI of 6.1%.  She notes her husband was placed in a nursing home and she has been able to focus on her health more.) An ACE inhibitor/angiotensin II receptor blocker is being taken. She sees a podiatrist.Eye exam is current.    Review of systems  Constitutional: + decreasing body weight,  current Body mass index is 26.85 kg/m. , no fatigue, no subjective hyperthermia, no subjective hypothermia Eyes: no blurry vision, no xerophthalmia ENT: no sore throat, no nodules palpated in throat, no dysphagia/odynophagia, no hoarseness Cardiovascular: no chest pain, no shortness of breath, no palpitations, no leg swelling Respiratory: no cough, no shortness of breath Gastrointestinal: no nausea/vomiting/diarrhea Musculoskeletal: no muscle/joint aches Skin: no rashes, no hyperemia Neurological: no tremors, + numbness/ tingling to bilateral hands, no dizziness Psychiatric: no depression, no anxiety  Objective:     BP 138/76 (BP Location: Right Arm, Patient Position: Sitting, Cuff Size: Large)   Pulse 61   Ht 5\' 7"  (1.702 m)   Wt 171 lb 6.4 oz (77.7 kg)   BMI 26.85 kg/m   Wt Readings from Last 3 Encounters:  11/18/23 171 lb 6.4 oz (77.7 kg)  11/14/23 171 lb 12.8 oz (77.9 kg)  10/15/23 175 lb 0.6 oz (79.4 kg)     BP Readings from Last 3 Encounters:  11/18/23 138/76  11/14/23 137/79  10/15/23 (!) 94/48      Physical Exam- Limited  Constitutional:  Body mass index is 26.85 kg/m. , not in acute distress, normal state of mind Eyes:  EOMI, no exophthalmos Musculoskeletal: no gross deformities, strength intact in all four extremities, no gross restriction of joint movements Skin:  no rashes, no hyperemia Neurological: no tremor with outstretched hands   Diabetic Foot Exam - Simple   Simple Foot Form Diabetic Foot exam was performed  with the following findings: Yes 11/18/2023  9:25 AM  Visual Inspection See comments: Yes Sensation Testing Intact to touch and monofilament testing bilaterally: Yes Pulse Check Posterior Tibialis and Dorsalis pulse intact bilaterally: Yes Comments Calluses to plantar feet bilaterally     CMP ( most recent) CMP     Component Value Date/Time   NA 139 11/11/2023 1028   K 3.9 11/11/2023 1028   CL 102 11/11/2023 1028   CO2 23 11/11/2023 1028   GLUCOSE 102 (H) 11/11/2023 1028   GLUCOSE 102 (H) 04/25/2021 1343   BUN 14 11/11/2023 1028   CREATININE 0.73 11/11/2023 1028   CALCIUM 9.2 11/11/2023 1028   PROT 6.1 07/08/2023 0914   ALBUMIN 3.9 07/08/2023 0914   AST 28 07/08/2023 0914   ALT 20 07/08/2023 0914   ALKPHOS 34 (L) 07/08/2023 0914   BILITOT 0.4 07/08/2023 0914   GFRNONAA >60 04/25/2021 1343   GFRAA >60 09/12/2019 0239     Diabetic Labs (most recent): Lab Results  Component Value Date   HGBA1C 5.8 (A) 11/18/2023   HGBA1C 6.2 (A) 06/17/2023   HGBA1C 8.5 (H) 02/22/2023     Lipid Panel ( most recent) Lipid Panel     Component Value Date/Time   CHOL 127 07/08/2023 0914   TRIG 121 07/08/2023 0914   HDL 42 07/08/2023 0914   CHOLHDL 3.0 07/08/2023 0914   LDLCALC 63 07/08/2023 0914  LABVLDL 22 07/08/2023 0914      Lab Results  Component Value Date   TSH 0.313 (L) 07/08/2023   TSH 0.648 02/22/2023   TSH 0.317 (L) 08/22/2022   TSH 0.403 (L) 05/22/2022   FREET4 1.43 07/08/2023   FREET4 1.25 02/22/2023   FREET4 1.46 08/22/2022   FREET4 1.52 05/22/2022           Assessment & Plan:   1) Type 2 diabetes mellitus without complication, with long-term current use of insulin (HCC)  She presents today with her CGM showing at goal glycemic profile.  Her POCT A1c today is 5.8%, improving from last visit of 6.2%.  Analysis of her CGM shows TIR 97%, TAR 3%, TBR 0% with a GMI of 6.1%.  She notes her husband was placed in a nursing home and she has been able to focus  on her health more.  - [name] has currently uncontrolled symptomatic type 2 DM since 72 years of age.   -Recent labs reviewed.  - I had a long discussion with her about the progressive nature of diabetes and the pathology behind its complications. -her diabetes is not currently complicated but she remains at a high risk for more acute and chronic complications which include CAD, CVA, CKD, retinopathy, and neuropathy. These are all discussed in detail with her.  The following Lifestyle Medicine recommendations according to American College of Lifestyle Medicine [facility]) were discussed and offered to patient and she agrees to start the journey:  A. Whole Foods, Plant-based plate comprising of fruits and vegetables, plant-based proteins, whole-grain carbohydrates was discussed in detail with the patient.   A list for source of those nutrients were also provided to the patient.  Patient will use only water or unsweetened tea for hydration. B.  The need to stay away from risky substances including alcohol, smoking; obtaining 7 to 9 hours of restorative sleep, at least 150 minutes of moderate intensity exercise weekly, the importance of healthy social connections,  and stress reduction techniques were discussed. C.  A full color page of  Calorie density of various food groups per pound showing examples of each food groups was provided to the patient.  - Nutritional counseling repeated at each appointment due to patients tendency to fall back in to old habits.  - The patient admits there is a room for improvement in their diet and drink choices. -  Suggestion is made for the patient to avoid simple carbohydrates from their diet including Cakes, Sweet Desserts / Pastries, Ice Cream, Soda (diet and regular), Sweet Tea, Candies, Chips, Cookies, Sweet Pastries, Store Bought Juices, Alcohol in Excess of 1-2 drinks a day, Artificial Sweeteners, Coffee Creamer, and "Sugar-free" Products. This will help  patient to have stable blood glucose profile and potentially avoid unintended weight gain.   - I encouraged the patient to switch to unprocessed or minimally processed complex starch and increased protein intake (animal or plant source), fruits, and vegetables.   - Patient is advised to stick to a routine mealtimes to eat 3 meals a day and avoid unnecessary snacks (to snack only to correct hypoglycemia).  - I have approached her with the following individualized plan to manage her diabetes and patient agrees:   -She is advised to lower her Lantus to 30 units SQ nightly and lower her Humalog to 6-412 units TID with meals if glucose is above 90 and she is eating (Specific instructions on how to titrate insulin dosage based on glucose readings given to  patient in writing).  She can also continue Rybelsus 14 mg po daily before breakfast.  We discussed potential GI SE to watch for.  -she is encouraged to continue monitoring glucose 4 times daily (using her CGM), before meals and before bed, and to call the clinic if she has readings less than 70 or above 300 for 3 tests in a row.   - she is warned not to take insulin without proper monitoring per orders. - Adjustment parameters are given to her for hypo and hyperglycemia in writing.  - she is not a candidate for sulfa meds due to allergy.  - Specific targets for  A1c; LDL, HDL, and Triglycerides were discussed with the patient.  2) Blood Pressure /Hypertension:  her blood pressure is controlled to target without the use of antihypertensive medications.  3) Lipids/Hyperlipidemia:    Review of her recent lipid panel from 02/22/23 showed controlled LDL at 68.  she is advised to continue Lipitor 20 mg daily at bedtime.  Side effects and precautions discussed with her.  4)  Weight/Diet:  her Body mass index is 26.85 kg/m.  -  clearly complicating her diabetes care.   she is a candidate for weight loss. I discussed with her the fact that loss of 5 -  10% of her  current body weight will have the most impact on her diabetes management.  Exercise, and detailed carbohydrates information provided  -  detailed on discharge instructions.  5) Hypothyroidism-unspecified -Currently managed by her PCP.  6) Vitamin D deficiency Her recent vitamin D level was low at 21.4.  Her PCP put her on Ergocalciferol 50000 units weekly.  7) Chronic Care/Health Maintenance: -she is not on ACEI/ARB and is on Statin medications and is encouraged to initiate and continue to follow up with Ophthalmology, Dentist, Podiatrist at least yearly or according to recommendations, and advised to stay away from smoking. I have recommended yearly flu vaccine and pneumonia vaccine at least every 5 years; moderate intensity exercise for up to 150 minutes weekly; and sleep for at least 7 hours a day.  - she is advised to maintain close follow up with Gilmore Laroche, FNP for primary care needs, as well as her other providers for optimal and coordinated care.     I spent  42  minutes in the care of the patient today including review of labs from CMP, Lipids, Thyroid Function, Hematology (current and previous including abstractions from other facilities); face-to-face time discussing  her blood glucose readings/logs, discussing hypoglycemia and hyperglycemia episodes and symptoms, medications doses, her options of short and long term treatment based on the latest standards of care / guidelines;  discussion about incorporating lifestyle medicine;  and documenting the encounter. Risk reduction counseling performed per USPSTF guidelines to reduce obesity and cardiovascular risk factors.     Please refer to Patient Instructions for Blood Glucose Monitoring and Insulin/Medications Dosing Guide"  in media tab for additional information. Please  also refer to " Patient Self Inventory" in the Media  tab for reviewed elements of pertinent patient history.  [name] participated in the  discussions, expressed understanding, and voiced agreement with the above plans.  All questions were answered to her satisfaction. she is encouraged to contact clinic should she have any questions or concerns prior to her return visit.     Follow up plan: - Return in about 4 months (around 03/17/2024) for Diabetes F/U with A1c in office, No previsit labs, Bring meter and logs.  [name]  [name] [facility] [address] [place], [place] [address] Phone: [phone] Fax: [phone]  11/18/2023, 9:42 AM

## 2023-11-29 DIAGNOSIS — Z743 Need for continuous supervision: Secondary | ICD-10-CM | POA: Diagnosis not present

## 2023-11-29 DIAGNOSIS — G459 Transient cerebral ischemic attack, unspecified: Secondary | ICD-10-CM | POA: Diagnosis not present

## 2023-11-29 DIAGNOSIS — R161 Splenomegaly, not elsewhere classified: Secondary | ICD-10-CM | POA: Diagnosis not present

## 2023-11-29 DIAGNOSIS — Z91030 Bee allergy status: Secondary | ICD-10-CM | POA: Diagnosis not present

## 2023-11-29 DIAGNOSIS — Z885 Allergy status to narcotic agent status: Secondary | ICD-10-CM | POA: Diagnosis not present

## 2023-11-29 DIAGNOSIS — Z794 Long term (current) use of insulin: Secondary | ICD-10-CM | POA: Diagnosis not present

## 2023-11-29 DIAGNOSIS — Z882 Allergy status to sulfonamides status: Secondary | ICD-10-CM | POA: Diagnosis not present

## 2023-11-29 DIAGNOSIS — R1030 Lower abdominal pain, unspecified: Secondary | ICD-10-CM | POA: Diagnosis not present

## 2023-11-29 DIAGNOSIS — Z87891 Personal history of nicotine dependence: Secondary | ICD-10-CM | POA: Diagnosis not present

## 2023-11-29 DIAGNOSIS — R531 Weakness: Secondary | ICD-10-CM | POA: Diagnosis not present

## 2023-11-29 DIAGNOSIS — E785 Hyperlipidemia, unspecified: Secondary | ICD-10-CM | POA: Diagnosis not present

## 2023-11-29 DIAGNOSIS — E119 Type 2 diabetes mellitus without complications: Secondary | ICD-10-CM | POA: Diagnosis not present

## 2023-11-29 DIAGNOSIS — N390 Urinary tract infection, site not specified: Secondary | ICD-10-CM | POA: Diagnosis not present

## 2023-11-29 DIAGNOSIS — R1032 Left lower quadrant pain: Secondary | ICD-10-CM | POA: Diagnosis not present

## 2023-12-05 ENCOUNTER — Other Ambulatory Visit: Payer: Self-pay | Admitting: Family Medicine

## 2023-12-05 DIAGNOSIS — N3281 Overactive bladder: Secondary | ICD-10-CM

## 2023-12-08 ENCOUNTER — Other Ambulatory Visit: Payer: Self-pay | Admitting: Urology

## 2023-12-10 ENCOUNTER — Telehealth: Payer: Self-pay

## 2023-12-10 NOTE — Telephone Encounter (Signed)
Copied from CRM [id number]. Topic: Clinical - Medical Advice >> Dec 10, 2023  2:03 PM Theodis Sato wrote: Reason for CRM: PT states she just finished a round of Keflex prescribed by a Dr. Berenice Bouton- PT states she doesn't know if she is to continue this medication or exactly why it was prescribed but that she is out of the meds and seeking advise from Gilmore Laroche

## 2024-01-01 ENCOUNTER — Encounter (HOSPITAL_COMMUNITY): Payer: Self-pay

## 2024-01-01 ENCOUNTER — Emergency Department (HOSPITAL_COMMUNITY): Payer: Medicare PPO

## 2024-01-01 ENCOUNTER — Emergency Department (HOSPITAL_COMMUNITY)
Admission: EM | Admit: 2024-01-01 | Discharge: 2024-01-01 | Disposition: A | Discharge status: Home / Self Care | Payer: Medicare PPO | Attending: Emergency Medicine | Admitting: Emergency Medicine

## 2024-01-01 DIAGNOSIS — W06XXXA Fall from bed, initial encounter: Secondary | ICD-10-CM | POA: Insufficient documentation

## 2024-01-01 DIAGNOSIS — Z794 Long term (current) use of insulin: Secondary | ICD-10-CM | POA: Insufficient documentation

## 2024-01-01 DIAGNOSIS — Z7984 Long term (current) use of oral hypoglycemic drugs: Secondary | ICD-10-CM | POA: Diagnosis not present

## 2024-01-01 DIAGNOSIS — M25532 Pain in left wrist: Secondary | ICD-10-CM

## 2024-01-01 DIAGNOSIS — Z7982 Long term (current) use of aspirin: Secondary | ICD-10-CM | POA: Insufficient documentation

## 2024-01-01 DIAGNOSIS — Z7951 Long term (current) use of inhaled steroids: Secondary | ICD-10-CM | POA: Insufficient documentation

## 2024-01-01 DIAGNOSIS — E119 Type 2 diabetes mellitus without complications: Secondary | ICD-10-CM | POA: Insufficient documentation

## 2024-01-01 DIAGNOSIS — J449 Chronic obstructive pulmonary disease, unspecified: Secondary | ICD-10-CM | POA: Insufficient documentation

## 2024-01-01 DIAGNOSIS — W19XXXA Unspecified fall, initial encounter: Secondary | ICD-10-CM

## 2024-01-01 DIAGNOSIS — F0390 Unspecified dementia without behavioral disturbance: Secondary | ICD-10-CM | POA: Diagnosis not present

## 2024-01-01 DIAGNOSIS — M79643 Pain in unspecified hand: Secondary | ICD-10-CM

## 2024-01-01 MED ORDER — ACETAMINOPHEN 325 MG PO TABS
650.0000 mg | ORAL_TABLET | Freq: Once | ORAL | Status: AC
  Administered 2024-01-01: 650 mg via ORAL
  Filled 2024-01-01: qty 2

## 2024-01-01 NOTE — Discharge Instructions (Signed)
 As we discussed, your workup in the ER today was reassuring for acute findings.  X-ray imaging of your wrist did not reveal any fracture or dislocation.  Given the level of your discomfort as well as the specific area that you are tender, I have placed you in a specific splint to support the areas that are hurting you.  Please wear this at all times.  You may remove it to take a shower.  Additionally, it is very important that you follow-up closely with a hand specialist.  I have given you a referral with a number to call to schedule an appointment for follow-up.  Please call at your earliest convenience.  In the interim, I recommend that you rest, ice, compress, and elevate your wrist and take Tylenol  for pain.  Return if development of any new or worsening symptoms.

## 2024-01-01 NOTE — ED Provider Notes (Signed)
 [place] [facility] AT [facility] Provider Note   CSN: [id number] Arrival date & time: 01/01/24  1730     History  Chief Complaint  Patient presents with   Fall   Wrist Injury    [name] is a 73 y.o. female.  Patient with history of diabetes, COPD, dementia presents today with complaints of fall.  She states that same occurred yesterday morning when she accidentally rolled out of bed.  She tried to catch herself with her left hand and injured her left wrist.  She did not hit her head or lose consciousness.  She is not anticoagulated.  She was able to get up and walk around afterwards without issue.  She is endorsing persistent left wrist pain.  Also notes swelling to same.  Denies any other injuries or complaints.  The history is provided by the patient. No language interpreter was used.  Fall  Wrist Injury      Home Medications Prior to Admission medications   Medication Sig Start Date End Date Taking? Authorizing Provider  acyclovir  (ZOVIRAX ) 400 MG tablet TAKE 1 TABLET BY MOUTH EVERY TWELVE HOURS (AT BREAKFAST AND AT BEDTIME) 11/04/23   Zarwolo, Gloria, FNP  alendronate  (FOSAMAX ) 70 MG tablet TAKE 1 TABLET BY MOUTH ONCE A WEEK, TAKE with full GLASS of water  ON an EMPTY stomach 10/04/23   Zarwolo, Gloria, FNP  Apple Cider Vinegar 300 MG TABS Take by mouth.    [provider]  aspirin  81 MG chewable tablet Chew 1 tablet (81 mg total) by mouth 2 (two) times daily. 09/14/19   Chadwell, Fonda, PA-C  atorvastatin  (LIPITOR) 20 MG tablet TAKE 1 TABLET BY MOUTH AT BEDTIME 11/04/23   Zarwolo, Gloria, FNP  Blood Pressure Monitoring (BLOOD PRESSURE MONITOR/L CUFF) MISC Dx: R03.0, elevated bp in office 08/01/22   Zarwolo, Gloria, FNP  cholecalciferol (VITAMIN D3) 25 MCG (1000 UNIT) tablet Take 1,000 Units by mouth daily.    [provider]  Continuous Glucose Sensor (DEXCOM G7 SENSOR) MISC USE AS DIRECTED TO test BLOOD SUGAR FOUR TIMES  DAILY. CHANGE sensor EVERY 10 DAYS 11/04/23   Therisa Benton PARAS, NP  donepezil  (ARICEPT ) 10 MG tablet TAKE 1 TABLET BY MOUTH EVERY DAY 11/04/23   Zarwolo, Gloria, FNP  EPINEPHrine  0.3 mg/0.3 mL IJ SOAJ injection Inject 0.3 mg into the muscle as needed for anaphylaxis. 05/24/22   Zarwolo, Gloria, FNP  escitalopram  (LEXAPRO ) 20 MG tablet TAKE 1 TABLET BY MOUTH EVERY DAY 11/04/23   Zarwolo, Gloria, FNP  glucose blood (TRUE METRIX BLOOD GLUCOSE TEST) test strip USE AS DIRECTED 08/08/22   Zarwolo, Gloria, FNP  hydrocortisone  (ANUSOL -HC) 2.5 % rectal cream Place 1 Application rectally 2 (two) times daily. 04/05/23   Zarwolo, Gloria, FNP  insulin  glargine (LANTUS ) 100 UNIT/ML Solostar Pen Inject 35 Units into the skin at bedtime. 11/18/23   Therisa Benton PARAS, NP  insulin  lispro (HUMALOG ) 100 UNIT/ML KwikPen Inject 6-12 Units into the skin 3 (three) times daily. 11/18/23   Therisa Benton PARAS, NP  Insulin  Pen Needle (BD AUTOSHIELD DUO) 30G X 5 MM MISC USE AS DIRECTED 07/18/23   Zarwolo, Gloria, FNP  levothyroxine  (SYNTHROID ) 88 MCG tablet Take 1 tablet (88 mcg total) by mouth daily. 07/09/23   Zarwolo, Gloria, FNP  loperamide (IMODIUM) 2 MG capsule Take 2 mg by mouth See admin instructions. Take 2 mg PO after each loose stool as needed for diarrhea (max of 3 doses in 24 hours) 09/29/21   [provider]  methocarbamol  (ROBAXIN ) 500 MG tablet Take 1 tablet (500 mg total) by mouth 2 (two) times daily. 08/06/22   Zarwolo, Gloria, FNP  MYRBETRIQ  25 MG TB24 tablet TAKE 1 TABLET BY MOUTH DAILY 12/05/23   Zarwolo, Gloria, FNP  nystatin  cream (MYCOSTATIN ) apply TO SKIN folds, UNDER breasts, abdominal folds, AND inguinal folds TWICE DAILY FOR candidasis 07/18/23   Zarwolo, Gloria, FNP  pantoprazole  (PROTONIX ) 40 MG tablet Take 1 tablet (40 mg total) by mouth 2 (two) times daily. 10/01/23   Zarwolo, Gloria, FNP  polyethylene glycol powder (GLYCOLAX /MIRALAX ) 17 GM/SCOOP powder SMARTSIG:1 Capful(s) By Mouth Daily  10/18/22   [provider]  prazosin  (MINIPRESS ) 2 MG capsule TAKE ONE CAPSULE BY MOUTH AT BEDTIME 11/04/23   Zarwolo, Gloria, FNP  predniSONE  (DELTASONE ) 20 MG tablet Take 1 tablet (20 mg total) by mouth 2 (two) times daily with a meal. 07/11/22   Antonetta Rollene BRAVO, MD  Semaglutide  (RYBELSUS ) 14 MG TABS Take 1 tablet (14 mg total) by mouth daily. 06/17/23   Therisa Benton PARAS, NP  Specialty Vitamins Products (COLLAGEN ULTRA PO) Take by mouth.    [provider]  Tetrahydrozoline-Zn Sulfate (EYE DROPS AR OP) Apply to eye. As needed    [provider]  traZODone  (DESYREL ) 50 MG tablet TAKE 2 TABLETS BY MOUTH AT BEDTIME 07/18/23   Zarwolo, Gloria, FNP  trospium  (SANCTURA ) 20 MG tablet TAKE 1 TABLET BY MOUTH TWICE DAILY 12/08/23   Watt Rush, MD  Vibegron  (GEMTESA ) 75 MG TABS Take 75 mg by mouth daily. 05/14/22   Stoneking, Adine PARAS., MD  vitamin B-12 (CYANOCOBALAMIN ) 1000 MCG tablet Take 1,000 mcg by mouth daily.    [provider]  Vitamin D , Ergocalciferol , 50000 units CAPS TAKE ONE CAPSULE BY MOUTH ONCE WEEKLY ON TUESDAY 10/04/23   Zarwolo, Gloria, FNP      Allergies    Ambien [zolpidem tartrate], Bee venom, Coconut flavoring agent (non-screening), Codeine, Onion, Other, and Sulfa antibiotics    Review of Systems   Review of Systems  Musculoskeletal:  Positive for arthralgias.  All other systems reviewed and are negative.   Physical Exam Updated Vital Signs BP (!) 145/80 (BP Location: Right Arm)   Pulse 64   Temp 98.3 F (36.8 C)   Resp 16   Ht 5' 7 (1.702 m)   Wt 77.6 kg   SpO2 100%   BMI 26.78 kg/m  Physical Exam Vitals and nursing note reviewed.  Constitutional:      General: She is not in acute distress.    Appearance: Normal appearance. She is normal weight. She is not ill-appearing, toxic-appearing or diaphoretic.  HENT:     Head: Normocephalic and atraumatic.     Comments: No racoon eyes No battle sign Eyes:     Extraocular  Movements: Extraocular movements intact.     Pupils: Pupils are equal, round, and reactive to light.  Cardiovascular:     Rate and Rhythm: Normal rate.     Comments: No tenderness to palpation of the anterior chest wall Pulmonary:     Effort: Pulmonary effort is normal. No respiratory distress.  Abdominal:     Comments: No abdominal tenderness  Musculoskeletal:        General: Normal range of motion.     Cervical back: Normal and normal range of motion.     Thoracic back: Normal.     Lumbar back: Normal.     Comments: No midline tenderness, no stepoffs or deformity noted on palpation of  cervical, thoracic, and lumbar spine  TTP noted to the dorsal aspect of the left wrist bilaterally.  There is snuffbox tenderness.  ROM limited due to pain.  Radial and ulnar pulses intact and 2+.  Capillary refill less than 2 seconds.  No obvious deformity.  Skin:    General: Skin is warm and dry.  Neurological:     General: No focal deficit present.     Mental Status: She is alert and oriented to person, place, and time.  Psychiatric:        Mood and Affect: Mood normal.        Behavior: Behavior normal.     ED Results / Procedures / Treatments   Labs (all labs ordered are listed, but only abnormal results are displayed) Labs Reviewed - No data to display  EKG None  Radiology DG Wrist Complete Left Result Date: 01/01/2024 CLINICAL DATA:  Fall out of bed yesterday. Deformity. Pain. Swelling. EXAM: LEFT WRIST - COMPLETE 3+ VIEW COMPARISON:  Left wrist radiographs 06/20/2023 FINDINGS: There is again diffuse decreased bone mineralization. Mild radiocarpal joint space narrowing. Mild calcification in the region of the triangular fibrocartilage complex. Moderate triscaphe joint space narrowing and peripheral osteophytosis. Severe thumb carpometacarpal joint space narrowing, subchondral sclerosis, and peripheral osteophytosis. Mild calcific density just distal to the radial styloid is unchanged.  IMPRESSION: 1. No acute fracture. 2. 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 Electronically Signed   By: Tanda Lyons M.D.   On: 01/01/2024 20:23    Procedures Procedures    Medications Ordered in ED Medications  acetaminophen  (TYLENOL ) tablet 650 mg (has no administration in time range)    ED Course/ Medical Decision Making/ A&P                                 Medical Decision Making Amount and/or Complexity of Data Reviewed Radiology: ordered.  Risk OTC drugs.   Patient presents today with complaints of fall yesterday with left wrist pain.  She is afebrile, non-toxic appearing, and in no acute distress with reassuring vital signs. Physical exam reveals TTP noted to the dorsal aspect of the left wrist with mild swelling and bruising. There is snuffbox tenderness. No obvious deformity.  Good distal pulses and sensation.  ROM limited due to pain.  X-ray imaging obtained of the left wrist which has resulted and reveals  1. No acute fracture. 2. 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  I have personally reviewed and interpreted this imaging and agree with radiology interpretation.  Given patient's discomfort, swelling, and snuffbox tenderness, placed in a thumb spica splint with concern for scaphoid fracture.  Given Tylenol  for pain with improvement.  Recommend close hand surgery follow-up as well.  Recommend supportive care with RICE and Tylenol  for pain. Evaluation and diagnostic testing in the emergency department does not suggest an emergent condition requiring admission or immediate intervention beyond what has been performed at this time.  Plan for discharge with close PCP follow-up.  Patient is understanding and amenable with plan, educated on red flag symptoms that would prompt immediate return.  Patient discharged in stable condition.  Final Clinical Impression(s) / ED Diagnoses Final  diagnoses:  Fall, initial encounter  Left wrist pain  Tenderness of anatomical snuffbox    Rx / DC Orders ED Discharge Orders     None     An After Visit Summary was printed and given to  the patient.     Nora Lauraine DELENA DEVONNA 01/01/24 2216    Mannie Pac T, DO 01/02/24 2244

## 2024-01-01 NOTE — ED Triage Notes (Signed)
 Pt c/o L wrist pain, swelling, and deformity r/t a fall out of bed yesterday.  Pain score 10/10.

## 2024-01-02 ENCOUNTER — Other Ambulatory Visit: Payer: Self-pay | Admitting: Nurse Practitioner

## 2024-01-02 ENCOUNTER — Other Ambulatory Visit: Payer: Self-pay | Admitting: Family Medicine

## 2024-01-02 ENCOUNTER — Ambulatory Visit: Payer: BC Managed Care – PPO | Admitting: Urology

## 2024-01-02 DIAGNOSIS — E119 Type 2 diabetes mellitus without complications: Secondary | ICD-10-CM

## 2024-01-02 DIAGNOSIS — E039 Hypothyroidism, unspecified: Secondary | ICD-10-CM

## 2024-01-07 ENCOUNTER — Telehealth: Payer: Self-pay | Admitting: Family Medicine

## 2024-01-07 ENCOUNTER — Ambulatory Visit: Payer: Medicare PPO | Admitting: Family Medicine

## 2024-01-07 ENCOUNTER — Encounter: Payer: Self-pay | Admitting: Family Medicine

## 2024-01-07 VITALS — BP 114/60 | HR 64 | Ht 67.0 in | Wt 167.1 lb

## 2024-01-07 DIAGNOSIS — N3281 Overactive bladder: Secondary | ICD-10-CM | POA: Diagnosis not present

## 2024-01-07 DIAGNOSIS — E785 Hyperlipidemia, unspecified: Secondary | ICD-10-CM

## 2024-01-07 DIAGNOSIS — E1169 Type 2 diabetes mellitus with other specified complication: Secondary | ICD-10-CM | POA: Diagnosis not present

## 2024-01-07 DIAGNOSIS — Z794 Long term (current) use of insulin: Secondary | ICD-10-CM | POA: Diagnosis not present

## 2024-01-07 DIAGNOSIS — E038 Other specified hypothyroidism: Secondary | ICD-10-CM | POA: Diagnosis not present

## 2024-01-07 DIAGNOSIS — E559 Vitamin D deficiency, unspecified: Secondary | ICD-10-CM

## 2024-01-07 DIAGNOSIS — F3289 Other specified depressive episodes: Secondary | ICD-10-CM

## 2024-01-07 DIAGNOSIS — E119 Type 2 diabetes mellitus without complications: Secondary | ICD-10-CM

## 2024-01-07 MED ORDER — DEXCOM G7 SENSOR MISC: 2 refills | Status: DC

## 2024-01-07 NOTE — Assessment & Plan Note (Signed)
 Encouraged to continue taking synthroid  112 mcg The patient is encouraged to follow up if experiencing symptoms of hypothyroidism, despite compliance with the current treatment regimen, such as fatigue, weight gain, constipation, dry skin, cold intolerance, hair loss, or depression.  Lab Results  Component Value Date   TSH 0.313 (L) 07/08/2023

## 2024-01-07 NOTE — Patient Instructions (Addendum)
 I appreciate the opportunity to provide care to you today!    Follow up:  4 months  Labs: please stop by the lab today to get your blood drawn (TSH, Lipid profile, Vit D)  Attached with your AVS, you will find valuable resources for self-education. I highly recommend dedicating some time to thoroughly examine them.    Referral: Psychology for talk therapy  Please continue to a heart-healthy diet and increase your physical activities. Try to exercise for at least five days a week.    It was a pleasure to see you and I look forward to continuing to work together on your health and well-being. Please do not hesitate to call the office if you need care or have questions about your care.  In case of emergency, please visit the Emergency Department for urgent care, or contact our clinic at [phone] to schedule an appointment. We're here to help you!   Have a wonderful day and week. With Gratitude, Syrah Daughtrey MSN, FNP-BC

## 2024-01-07 NOTE — Assessment & Plan Note (Signed)
 Encouraged to continue taking Rybelsus  14 mg daily. Encouraged to continue taking Lantus  30 units nightly and Humalog  6 units 3 times daily. Encouraged to follow up with endocrinology as scheduled. She is encouraged to continue monitoring glucose 4 times daily (using her CGM), before meals and before bed, and to call the clinic if she has readings less than 70 or above 300 for 3 consecutive tests.  Lab Results  Component Value Date   HGBA1C 5.8 (A) 11/18/2023

## 2024-01-07 NOTE — Assessment & Plan Note (Addendum)
 Stable on Myrbetriq 25 mg daily She denies dysuria, fever and chills Encouraged to continue treatment regimen

## 2024-01-07 NOTE — Assessment & Plan Note (Signed)
 She takes Lipitor 20 mg daily and reports treatment compliance The patient was encouraged to make lifestyle changes, including avoiding simple carbohydrates such as cakes, sweet desserts, ice cream, soda (diet or regular), sweet tea, candies, chips, cookies, store-bought juices, excessive alcohol (more than 1-2 drinks per day), lemonade, artificial sweeteners, donuts, coffee creamers, and sugar-free products. Additionally, reducing the consumption of greasy, fatty foods and increasing physical activity were recommended. The patient verbalized understanding and is aware of the plan of care.

## 2024-01-07 NOTE — Progress Notes (Signed)
 Established Patient Office Visit  Subjective:  Patient ID: [name], female    DOB: 05-Aug-1951  Age: 73 y.o. MRN: [id number]  CC:  Chief Complaint  Patient presents with   Follow-up    65mo    HPI [name] is a 73 y.o. female with past medical history of overactive bladder, GERD, hyperlipidemia, hypothyroidism, type II diabetes presents for f/u of  chronic medical conditions. For the details of today's visit, please refer to the assessment and plan.     Past Medical History:  Diagnosis Date   Anemia    as a teenager   Anxiety    Arthritis    knees, rt hand,hips   Cancer (HCC)    vaginal,uterine,ovariam   COPD (chronic obstructive pulmonary disease) (HCC)    Depression    Diabetes mellitus without complication (HCC)    Hypothyroidism    Neuromuscular disorder (HCC)    hands and feet    Past Surgical History:  Procedure Laterality Date   ABDOMINAL HYSTERECTOMY     BIOPSY  08/06/2023   Procedure: BIOPSY;  Surgeon: Cindie Carlin MARLA, DO;  Location: [facility];  Service: Endoscopy;;   BREAST SURGERY Right 1988   lumpectomy   COLONOSCOPY WITH PROPOFOL  N/A 08/06/2023   Procedure: COLONOSCOPY WITH PROPOFOL ;  Surgeon: Cindie Carlin MARLA, DO;  [facility]: [facility];  Service: Endoscopy;  Laterality: N/A;  900am, asa 2   ESOPHAGOGASTRODUODENOSCOPY (EGD) WITH PROPOFOL  N/A 08/06/2023   Procedure: ESOPHAGOGASTRODUODENOSCOPY (EGD) WITH PROPOFOL ;  Surgeon: Cindie Carlin MARLA, DO;  Location: [facility];  Service: Endoscopy;  Laterality: N/A;   EYE SURGERY Bilateral    FRACTURE SURGERY     Rt ankle,lt knee cap,lt shoulder,rt wrist,rt shoulder,   POLYPECTOMY  08/06/2023   Procedure: POLYPECTOMY;  Surgeon: Cindie Carlin MARLA, DO;  [facility]: [facility];  Service: Endoscopy;;   TONSILLECTOMY     at age 59   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   Procedure: TOTAL KNEE ARTHROPLASTY;  Surgeon: Shari Sieving, MD;  Location: [facility];  Service: Orthopedics;   Laterality: Right;    Family History  Problem Relation Age of Onset   Cancer Mother    Hypertension Mother    Thyroid  disease Mother    Diabetes Mother    Stroke Mother    Heart failure Mother    Diabetes Father    Heart attack Father    Diabetes Sister    Diabetes Brother     Social History   Socioeconomic History   Marital status: Married    Spouse name: Not on file   Number of children: Not on file   Years of education: Not on file   Highest education level: Not on file  Occupational History   Not on file  Tobacco Use   Smoking status: Former    Current packs/day: 0.00    Average packs/day: 0.5 packs/day for 15.0 years (7.5 ttl pk-yrs)    Types: Cigarettes    Start date: 21    Quit date: 2013    Years since quitting: 12.0   Smokeless tobacco: Never  Vaping Use   Vaping status: Never Used  Substance and Sexual Activity   Alcohol use: Never   Drug use: Never   Sexual activity: Not on file  Other Topics Concern   Not on file  Social History Narrative   Not on file   Social Drivers of Health   Financial Resource Strain: Low Risk  (07/25/2023)  Overall Financial Resource Strain (CARDIA)    Difficulty of Paying Living Expenses: Not hard at all  Food Insecurity: No Food Insecurity (07/25/2023)   Hunger Vital Sign    Worried About Running Out of Food in the Last Year: Never true    Ran Out of Food in the Last Year: Never true  Transportation Needs: No Transportation Needs (07/25/2023)   PRAPARE - Administrator, Civil Service (Medical): No    Lack of Transportation (Non-Medical): No  Physical Activity: Sufficiently Active (07/25/2023)   Exercise Vital Sign    Days of Exercise per Week: 7 days    Minutes of Exercise per Session: 30 min  Stress: Stress Concern Present (07/25/2023)   [employer] of Occupational Health - Occupational Stress Questionnaire    Feeling of Stress : Very much  Social Connections: Moderately Isolated (07/25/2023)   Social  Connection and Isolation Panel [NHANES]    Frequency of Communication with Friends and Family: More than three times a week    Frequency of Social Gatherings with Friends and Family: More than three times a week    Attends Religious Services: Never    Database Administrator or Organizations: No    Attends Banker Meetings: Never    Marital Status: Married  Catering Manager Violence: Not At Risk (07/25/2023)   Humiliation, Afraid, Rape, and Kick questionnaire    Fear of Current or Ex-Partner: No    Emotionally Abused: No    Physically Abused: No    Sexually Abused: No    Outpatient Medications Prior to Visit  Medication Sig Dispense Refill   acyclovir  (ZOVIRAX ) 400 MG tablet TAKE 1 TABLET BY MOUTH EVERY TWELVE HOURS (AT BREAKFAST AND AT BEDTIME) 90 tablet 1   alendronate  (FOSAMAX ) 70 MG tablet TAKE 1 TABLET BY MOUTH ONCE A WEEK, TAKE with full GLASS of water  ON an EMPTY stomach 4 tablet 11   Apple Cider Vinegar 300 MG TABS Take by mouth.     aspirin  81 MG chewable tablet Chew 1 tablet (81 mg total) by mouth 2 (two) times daily. 60 tablet 0   atorvastatin  (LIPITOR) 20 MG tablet TAKE 1 TABLET BY MOUTH AT BEDTIME 90 tablet 1   Blood Pressure Monitoring (BLOOD PRESSURE MONITOR/L CUFF) MISC Dx: R03.0, elevated bp in office 1 each 0   cholecalciferol (VITAMIN D3) 25 MCG (1000 UNIT) tablet Take 1,000 Units by mouth daily.     donepezil  (ARICEPT ) 10 MG tablet TAKE 1 TABLET BY MOUTH EVERY DAY 90 tablet 1   EPINEPHrine  0.3 mg/0.3 mL IJ SOAJ injection Inject 0.3 mg into the muscle as needed for anaphylaxis. 1 each 2   escitalopram  (LEXAPRO ) 20 MG tablet TAKE 1 TABLET BY MOUTH EVERY DAY 30 tablet 2   glucose blood (TRUE METRIX BLOOD GLUCOSE TEST) test strip USE AS DIRECTED 100 each 2   insulin  glargine (LANTUS ) 100 UNIT/ML Solostar Pen Inject 35 Units into the skin at bedtime. 30 mL 3   insulin  lispro (HUMALOG ) 100 UNIT/ML KwikPen Inject 6-12 Units into the skin 3 (three) times daily. 36  mL 3   Insulin  Pen Needle (BD AUTOSHIELD DUO) 30G X 5 MM MISC USE AS DIRECTED 100 each 2   levothyroxine  (SYNTHROID ) 88 MCG tablet TAKE 1 TABLET BY MOUTH ONCE A DAY BEFORE BREAKFAST 90 tablet 1   loperamide (IMODIUM) 2 MG capsule Take 2 mg by mouth See admin instructions. Take 2 mg PO after each loose stool as needed for diarrhea (max  of 3 doses in 24 hours)     methocarbamol  (ROBAXIN ) 500 MG tablet Take 1 tablet (500 mg total) by mouth 2 (two) times daily. 30 tablet 0   nystatin  cream (MYCOSTATIN ) apply TO SKIN folds, UNDER breasts, abdominal folds, AND inguinal folds TWICE DAILY FOR candidasis 30 g 1   pantoprazole  (PROTONIX ) 40 MG tablet Take 1 tablet (40 mg total) by mouth 2 (two) times daily. 90 tablet 1   prazosin  (MINIPRESS ) 2 MG capsule TAKE ONE CAPSULE BY MOUTH AT BEDTIME 90 capsule 1   predniSONE  (DELTASONE ) 20 MG tablet Take 1 tablet (20 mg total) by mouth 2 (two) times daily with a meal. 10 tablet 0   Semaglutide  (RYBELSUS ) 14 MG TABS Take 1 tablet (14 mg total) by mouth daily. 90 tablet 3   Specialty Vitamins Products (COLLAGEN ULTRA PO) Take by mouth.     trospium  (SANCTURA ) 20 MG tablet TAKE 1 TABLET BY MOUTH TWICE DAILY 120 tablet 5   vitamin B-12 (CYANOCOBALAMIN ) 1000 MCG tablet Take 1,000 mcg by mouth daily.     Vitamin D , Ergocalciferol , 50000 units CAPS TAKE ONE CAPSULE BY MOUTH ONCE WEEKLY ON TUESDAY 10 capsule 1   Continuous Glucose Sensor (DEXCOM G7 SENSOR) MISC USE AS DIRECTED TO test BLOOD SUGAR FOUR TIMES DAILY. CHANGE sensor EVERY 10 DAYS 3 each 2   hydrocortisone  (ANUSOL -HC) 2.5 % rectal cream Place 1 Application rectally 2 (two) times daily. (Patient not taking: Reported on 01/07/2024) 30 g 0   MYRBETRIQ  25 MG TB24 tablet TAKE 1 TABLET BY MOUTH DAILY (Patient not taking: Reported on 01/07/2024) 30 tablet 1   polyethylene glycol powder (GLYCOLAX /MIRALAX ) 17 GM/SCOOP powder SMARTSIG:1 Capful(s) By Mouth Daily (Patient not taking: Reported on 01/07/2024)      Tetrahydrozoline-Zn Sulfate (EYE DROPS AR OP) Apply to eye. As needed (Patient not taking: Reported on 01/07/2024)     traZODone  (DESYREL ) 50 MG tablet TAKE 2 TABLETS BY MOUTH AT BEDTIME (Patient not taking: Reported on 01/07/2024) 30 tablet 5   Vibegron  (GEMTESA ) 75 MG TABS Take 75 mg by mouth daily. (Patient not taking: Reported on 01/07/2024) 28 tablet 0   No facility-administered medications prior to visit.    Allergies  Allergen Reactions   Ambien [Zolpidem Tartrate] Other (See Comments)    Hallucinations/nightmares   Bee Venom Swelling   Coconut Flavoring Agent (Non-Screening) Swelling    Mouth swells   Codeine Nausea And Vomiting   Onion Nausea Only and Other (See Comments)    Raw onions   Other     Ground Beef (patient avoid due to side effect of diarrhea)   Sulfa Antibiotics Rash and Other (See Comments)    Including topical sulfa  (mouth dries/peels/skin blisters)    ROS Review of Systems  Constitutional:  Negative for chills and fever.  Eyes:  Negative for visual disturbance.  Respiratory:  Negative for chest tightness and shortness of breath.   Neurological:  Negative for dizziness and headaches.      Objective:    Physical Exam HENT:     Head: Normocephalic.     Mouth/Throat:     Mouth: Mucous membranes are moist.  Cardiovascular:     Rate and Rhythm: Normal rate.     Heart sounds: Normal heart sounds.  Pulmonary:     Effort: Pulmonary effort is normal.     Breath sounds: Normal breath sounds.  Neurological:     Mental Status: She is alert.     BP 114/60   Pulse 64  Ht 5' 7 (1.702 m)   Wt 167 lb 1.9 oz (75.8 kg)   SpO2 97%   BMI 26.17 kg/m  Wt Readings from Last 3 Encounters:  01/07/24 167 lb 1.9 oz (75.8 kg)  01/01/24 171 lb (77.6 kg)  11/18/23 171 lb 6.4 oz (77.7 kg)    Lab Results  Component Value Date   TSH 0.313 (L) 07/08/2023   Lab Results  Component Value Date   WBC 4.8 11/11/2023   HGB 11.6 11/11/2023   HCT 35.9 11/11/2023    MCV 88 11/11/2023   PLT 181 11/11/2023   Lab Results  Component Value Date   NA 139 11/11/2023   K 3.9 11/11/2023   CO2 23 11/11/2023   GLUCOSE 102 (H) 11/11/2023   BUN 14 11/11/2023   CREATININE 0.73 11/11/2023   BILITOT 0.4 07/08/2023   ALKPHOS 34 (L) 07/08/2023   AST 28 07/08/2023   ALT 20 07/08/2023   PROT 6.1 07/08/2023   ALBUMIN 3.9 07/08/2023   CALCIUM  9.2 11/11/2023   ANIONGAP 8 04/25/2021   EGFR 86 11/11/2023   Lab Results  Component Value Date   CHOL 127 07/08/2023   Lab Results  Component Value Date   HDL 42 07/08/2023   Lab Results  Component Value Date   LDLCALC 63 07/08/2023   Lab Results  Component Value Date   TRIG 121 07/08/2023   Lab Results  Component Value Date   CHOLHDL 3.0 07/08/2023   Lab Results  Component Value Date   HGBA1C 5.8 (A) 11/18/2023      Assessment & Plan:  Type 2 diabetes mellitus without complication, with long-term current use of insulin  (HCC) Assessment & Plan: Encouraged to continue taking Rybelsus  14 mg daily. Encouraged to continue taking Lantus  30 units nightly and Humalog  6 units 3 times daily. Encouraged to follow up with endocrinology as scheduled. She is encouraged to continue monitoring glucose 4 times daily (using her CGM), before meals and before bed, and to call the clinic if she has readings less than 70 or above 300 for 3 consecutive tests.  Lab Results  Component Value Date   HGBA1C 5.8 (A) 11/18/2023     Orders: -     Dexcom G7 Sensor; USE AS DIRECTED TO test BLOOD SUGAR FOUR TIMES DAILY. CHANGE sensor EVERY 10 DAYS  Dispense: 3 each; Refill: 2  Hyperlipidemia associated with type 2 diabetes mellitus (HCC) Assessment & Plan: She takes Lipitor 20 mg daily and reports treatment compliance The patient was encouraged to make lifestyle changes, including avoiding simple carbohydrates such as cakes, sweet desserts, ice cream, soda (diet or regular), sweet tea, candies, chips, cookies, store-bought  juices, excessive alcohol (more than 1-2 drinks per day), lemonade, artificial sweeteners, donuts, coffee creamers, and sugar-free products. Additionally, reducing the consumption of greasy, fatty foods and increasing physical activity were recommended. The patient verbalized understanding and is aware of the plan of care.   Orders: -     Lipid panel  Other specified hypothyroidism Assessment & Plan: Encouraged to continue taking synthroid  112 mcg The patient is encouraged to follow up if experiencing symptoms of hypothyroidism, despite compliance with the current treatment regimen, such as fatigue, weight gain, constipation, dry skin, cold intolerance, hair loss, or depression.  Lab Results  Component Value Date   TSH 0.313 (L) 07/08/2023      Overactive bladder Assessment & Plan: Stable on Myrbetriq  25 mg daily She denies dysuria, fever and chills Encouraged to continue treatment regimen  Other depression Assessment & Plan: A referral was placed to [facility]; however, the patient reports never receiving a call from the referral office. Please refer to the previous encounter on 10/15/2023 for the reason for the referral. I will place another referral  today for talk therapy. She denies suicidal thoughts or ideation.      Orders: -     Ambulatory referral to Psychology  Vitamin D  deficiency -     VITAMIN D  25 Hydroxy (Vit-D Deficiency, Fractures)  TSH (thyroid -stimulating hormone deficiency) -     TSH + free T4  Note: This chart has been completed using Engineer, Civil (consulting) software, and while attempts have been made to ensure accuracy, certain words and phrases may not be transcribed as intended.    Follow-up: No follow-ups on file.   Octavia Mottola, FNP

## 2024-01-07 NOTE — Assessment & Plan Note (Addendum)
 A referral was placed to [facility]; however, the patient reports never receiving a call from the referral office. Please refer to the previous encounter on 10/15/2023 for the reason for the referral. I will place another referral  today for talk therapy. She denies suicidal thoughts or ideation.

## 2024-01-07 NOTE — Telephone Encounter (Signed)
 Patient needs pre auth on   [facility] G7 Sensor

## 2024-01-08 ENCOUNTER — Encounter: Payer: Self-pay | Admitting: Orthopedic Surgery

## 2024-01-08 ENCOUNTER — Ambulatory Visit: Payer: Medicare PPO | Admitting: Orthopedic Surgery

## 2024-01-08 VITALS — BP 129/77 | HR 67 | Ht 67.0 in | Wt 169.0 lb

## 2024-01-08 DIAGNOSIS — S63502A Unspecified sprain of left wrist, initial encounter: Secondary | ICD-10-CM

## 2024-01-08 NOTE — Progress Notes (Signed)
 New Patient Visit  Assessment: [name] is a 73 y.o. female with the following: 1. Wrist sprain, left, initial encounter  Plan: [name] fell out of bed approximately 3 weeks ago, and had pain over the ulnar side of her wrist.  She reported that she had a lot of swelling.  The swelling is improving.  Her motion is improving.  Radiographs are negative.  Anticipate gradual improvement.  I provided her with hand and wrist exercises to initiate.  She should work to come out of the brace.  Medicines as needed.  If she is not getting better, we could consider an injection.  She states understanding.  She will follow-up as needed.  Follow-up: Return if symptoms worsen or fail to improve.  Subjective:  Chief Complaint  Patient presents with   Wrist Injury    L wrist after pt fell off her bed approx 3 wks ago.     History of Present Illness: [name] is a 73 y.o. female who presents for evaluation of left wrist pain.  She is right-hand dominant.  A couple of weeks ago, she states that she may have fallen out of bed.  She states she has a little bit of dementia, and cannot remember fully.  However, she gradually started to develop severe pain in the left wrist.  Ultimately, she presented to the emergency department about a week ago.  Radiographs are negative.  She noticed swelling in the ulnar side of the hand.  She continues to have pain in that area.  She has pain with radial deviation.  She has been wearing a brace.  She has been taking aspirin .  No additional injuries.  No pain at the base of her thumb.   Review of Systems: No fevers or chills No numbness or tingling No chest pain No shortness of breath No bowel or bladder dysfunction No GI distress No headaches   Medical History:  Past Medical History:  Diagnosis Date   Anemia    as a teenager   Anxiety    Arthritis    knees, rt hand,hips   Cancer (HCC)    vaginal,uterine,ovariam   COPD (chronic  obstructive pulmonary disease) (HCC)    Depression    Diabetes mellitus without complication (HCC)    Hypothyroidism    Neuromuscular disorder (HCC)    hands and feet    Past Surgical History:  Procedure Laterality Date   ABDOMINAL HYSTERECTOMY     BIOPSY  08/06/2023   Procedure: BIOPSY;  Surgeon: Vinetta Greening, DO;  [facility];  Service: Endoscopy;;   BREAST SURGERY Right 1988   lumpectomy   COLONOSCOPY WITH PROPOFOL  N/A 08/06/2023   Procedure: COLONOSCOPY WITH PROPOFOL ;  Surgeon: Vinetta Greening, DO;  [facility];  Service: Endoscopy;  Laterality: N/A;  900am, asa 2   ESOPHAGOGASTRODUODENOSCOPY (EGD) WITH PROPOFOL  N/A 08/06/2023   Procedure: ESOPHAGOGASTRODUODENOSCOPY (EGD) WITH PROPOFOL ;  Surgeon: Vinetta Greening, DO;  [facility];  Service: Endoscopy;  Laterality: N/A;   EYE SURGERY Bilateral    FRACTURE SURGERY     Rt ankle,lt knee cap,lt shoulder,rt wrist,rt shoulder,   POLYPECTOMY  08/06/2023   Procedure: POLYPECTOMY;  Surgeon: Vinetta Greening, DO;  [facility];  Service: Endoscopy;;   TONSILLECTOMY     at age 21   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   Procedure: TOTAL KNEE ARTHROPLASTY;  Surgeon: Marlena Sima, MD;  Location: [facility];  Service: Orthopedics;  Laterality: Right;    Family History  Problem Relation Age of Onset   Cancer Mother    Hypertension Mother    Thyroid  disease Mother    Diabetes Mother    Stroke Mother    Heart failure Mother    Diabetes Father    Heart attack Father    Diabetes Sister    Diabetes Brother    Social History   Tobacco Use   Smoking status: Former    Current packs/day: 0.00    Average packs/day: 0.5 packs/day for 15.0 years (7.5 ttl pk-yrs)    Types: Cigarettes    Start date: 17    Quit date: 2013    Years since quitting: 12.0   Smokeless tobacco: Never  Vaping Use   Vaping status: Never Used  Substance Use Topics   Alcohol use: Never   Drug use: Never     Allergies  Allergen Reactions   Ambien [Zolpidem Tartrate] Other (See Comments)    Hallucinations/nightmares   Bee Venom Swelling   Coconut Flavoring Agent (Non-Screening) Swelling    Mouth swells   Codeine Nausea And Vomiting   Onion Nausea Only and Other (See Comments)    Raw onions   Other     Ground Beef (patient avoid due to side effect of diarrhea)   Sulfa Antibiotics Rash and Other (See Comments)    Including topical sulfa  (mouth dries/peels/skin blisters)    Current Meds  Medication Sig   acyclovir  (ZOVIRAX ) 400 MG tablet TAKE 1 TABLET BY MOUTH EVERY TWELVE HOURS (AT BREAKFAST AND AT BEDTIME)   alendronate  (FOSAMAX ) 70 MG tablet TAKE 1 TABLET BY MOUTH ONCE A WEEK, TAKE with full GLASS of water  ON an EMPTY stomach   Apple Cider Vinegar 300 MG TABS Take by mouth.   aspirin  81 MG chewable tablet Chew 1 tablet (81 mg total) by mouth 2 (two) times daily.   atorvastatin  (LIPITOR) 20 MG tablet TAKE 1 TABLET BY MOUTH AT BEDTIME   Blood Pressure Monitoring (BLOOD PRESSURE MONITOR/L CUFF) MISC Dx: R03.0, elevated bp in office   cholecalciferol (VITAMIN D3) 25 MCG (1000 UNIT) tablet Take 1,000 Units by mouth daily.   Continuous Glucose Sensor (DEXCOM G7 SENSOR) MISC USE AS DIRECTED TO test BLOOD SUGAR FOUR TIMES DAILY. CHANGE sensor EVERY 10 DAYS   donepezil  (ARICEPT ) 10 MG tablet TAKE 1 TABLET BY MOUTH EVERY DAY   EPINEPHrine  0.3 mg/0.3 mL IJ SOAJ injection Inject 0.3 mg into the muscle as needed for anaphylaxis.   escitalopram  (LEXAPRO ) 20 MG tablet TAKE 1 TABLET BY MOUTH EVERY DAY   glucose blood (TRUE METRIX BLOOD GLUCOSE TEST) test strip USE AS DIRECTED   insulin  glargine (LANTUS ) 100 UNIT/ML Solostar Pen Inject 35 Units into the skin at bedtime.   insulin  lispro (HUMALOG ) 100 UNIT/ML KwikPen Inject 6-12 Units into the skin 3 (three) times daily.   Insulin  Pen Needle (BD AUTOSHIELD DUO) 30G X 5 MM MISC USE AS DIRECTED   levothyroxine  (SYNTHROID ) 88 MCG tablet TAKE 1 TABLET  BY MOUTH ONCE A DAY BEFORE BREAKFAST   loperamide (IMODIUM) 2 MG capsule Take 2 mg by mouth See admin instructions. Take 2 mg PO after each loose stool as needed for diarrhea (max of 3 doses in 24 hours)   methocarbamol  (ROBAXIN ) 500 MG tablet Take 1 tablet (500 mg total) by mouth 2 (two) times daily.   nystatin  cream (MYCOSTATIN ) apply TO SKIN folds, UNDER breasts, abdominal folds, AND inguinal folds TWICE DAILY FOR candidasis  pantoprazole  (PROTONIX ) 40 MG tablet Take 1 tablet (40 mg total) by mouth 2 (two) times daily.   prazosin  (MINIPRESS ) 2 MG capsule TAKE ONE CAPSULE BY MOUTH AT BEDTIME   predniSONE  (DELTASONE ) 20 MG tablet Take 1 tablet (20 mg total) by mouth 2 (two) times daily with a meal.   Semaglutide  (RYBELSUS ) 14 MG TABS Take 1 tablet (14 mg total) by mouth daily.   Specialty Vitamins Products (COLLAGEN ULTRA PO) Take by mouth.   trospium  (SANCTURA ) 20 MG tablet TAKE 1 TABLET BY MOUTH TWICE DAILY   vitamin B-12 (CYANOCOBALAMIN ) 1000 MCG tablet Take 1,000 mcg by mouth daily.   Vitamin D , Ergocalciferol , 50000 units CAPS TAKE ONE CAPSULE BY MOUTH ONCE WEEKLY ON TUESDAY    Objective: BP 129/77   Pulse 67   Ht 5\' 7"  (1.702 m)   Wt 169 lb (76.7 kg)   BMI 26.47 kg/m   Physical Exam:  General: Alert and oriented. and No acute distress. Gait: Normal gait.  Evaluation of the left wrist demonstrates some mild swelling over the ulnar side.  No bruising.  Tenderness to palpation in the area of the ulnar styloid.  Pain gets worse with radial deviation.  No worsening pain with ulnar deviation.  Fingers warm and well-perfused.  IMAGING: I personally reviewed images previously obtained from the ED  X-rays in the ED demonstrates no acute injury.  Advanced CMC arthritis and triscaphe arthritis   New Medications:  No orders of the defined types were placed in this encounter.     Tonita Frater, MD  01/08/2024 8:53 AM

## 2024-01-08 NOTE — Patient Instructions (Signed)
Hand Exercises  Hand exercises can be helpful for almost anyone. These exercises can strengthen the hands, improve flexibility and movement, and increase blood flow to the hands. These results can make work and daily tasks easier. Hand exercises can be especially helpful for people who have joint pain from arthritis or have nerve damage from overuse (carpal tunnel syndrome). These exercises can also help people who have injured a hand.  Exercises Most of these hand exercises are gentle stretching and motion exercises. It is usually safe to do them often throughout the day. Warming up your hands before exercise may help to reduce stiffness. You can do this with gentle massage or by placing your hands in warm water for 10-15 minutes. It is normal to feel some stretching, pulling, tightness, or mild discomfort as you begin new exercises. This will gradually improve. Stop an exercise right away if you feel sudden, severe pain or your pain gets worse. Ask your health care provider which exercises are best for you. Knuckle bend or "claw" fist Stand or sit with your arm, hand, and all five fingers pointed straight up. Make sure to keep your wrist straight during the exercise. Gently bend your fingers down toward your palm until the tips of your fingers are touching the top of your palm. Keep your big knuckle straight and just bend the small knuckles in your fingers. Hold this position for 10 seconds. Straighten (extend) your fingers back to the starting position. Repeat this exercise 5-10 times with each hand. Full finger fist Stand or sit with your arm, hand, and all five fingers pointed straight up. Make sure to keep your wrist straight during the exercise. Gently bend your fingers into your palm until the tips of your fingers are touching the middle of your palm. Hold this position for 10 seconds. Extend your fingers back to the starting position, stretching every joint fully. Repeat this exercise  5-10 times with each hand. Straight fist Stand or sit with your arm, hand, and all five fingers pointed straight up. Make sure to keep your wrist straight during the exercise. Gently bend your fingers at the big knuckle, where your fingers meet your hand, and the middle knuckle. Keep the knuckle at the tips of your fingers straight and try to touch the bottom of your palm. Hold this position for 10 seconds. Extend your fingers back to the starting position, stretching every joint fully. Repeat this exercise 5-10 times with each hand. Tabletop Stand or sit with your arm, hand, and all five fingers pointed straight up. Make sure to keep your wrist straight during the exercise. Gently bend your fingers at the big knuckle, where your fingers meet your hand, as far down as you can while keeping the small knuckles in your fingers straight. Think of forming a tabletop with your fingers. Hold this position for 10 seconds. Extend your fingers back to the starting position, stretching every joint fully. Repeat this exercise 5-10 times with each hand. Finger spread Place your hand flat on a table with your palm facing down. Make sure your wrist stays straight as you do this exercise. Spread your fingers and thumb apart from each other as far as you can until you feel a gentle stretch. Hold this position for 10 seconds. Bring your fingers and thumb tight together again. Hold this position for 10 seconds. Repeat this exercise 5-10 times with each hand. Making circles Stand or sit with your arm, hand, and all five fingers pointed straight up. Make   sure to keep your wrist straight during the exercise. Make a circle by touching the tip of your thumb to the tip of your index finger. Hold for 10 seconds. Then open your hand wide. Repeat this motion with your thumb and each finger on your hand. Repeat this exercise 5-10 times with each hand. Thumb motion Sit with your forearm resting on a table and your wrist  straight. Your thumb should be facing up toward the ceiling. Keep your fingers relaxed as you move your thumb. Lift your thumb up as high as you can toward the ceiling. Hold for 10 seconds. Bend your thumb across your palm as far as you can, reaching the tip of your thumb for the small finger (pinkie) side of your palm. Hold for 10 seconds. Repeat this exercise 5-10 times with each hand. Grip strengthening Hold a stress ball or other soft ball in the middle of your hand. Slowly increase the pressure, squeezing the ball as much as you can without causing pain. Think of bringing the tips of your fingers into the middle of your palm. All of your finger joints should bend when doing this exercise. Hold your squeeze for 10 seconds, then relax. Repeat this exercise 5-10 times with each hand.   Contact a health care provider if: Your hand pain or discomfort gets much worse when you do an exercise. Your hand pain or discomfort does not improve within 2 hours after you exercise. If you have any of these problems, stop doing these exercises right away. Do not do them again unless your health care provider says that you can.    Get help right away if: You develop sudden, severe hand pain or swelling. If this happens, stop doing these exercises right away. Do not do them again unless your health care provider says that you can. This information is not intended to replace advice given to you by your health care provider. Make sure you discuss any questions you have with your health care provider.     Wrist Fracture Rehab Ask your health care provider which exercises are safe for you. Do exercises exactly as told by your health care provider and adjust them as directed. It is normal to feel mild stretching, pulling, tightness, or discomfort as you do these exercises. Stop right away if you feel sudden pain or your pain gets worse. Do not begin these exercises until told by your health care  provider. Stretching and range-of-motion exercises These exercises warm up your muscles and joints and improve the movement and flexibility of your wrist and hand. These exercises also help to relieve pain,numbness, and tingling. Finger flexion and extension Sit or stand with your elbow at your side. Open and stretch your left / right fingers as wide as you can (extension). Hold this position for 10 seconds. Close your left / right fingers into a gentle fist (flexion). Hold this position for 10 seconds. Slowly return to the starting position. Repeat 10 times. Complete this exercise 1-2 times a day. Wrist flexion Bend your left / right elbow to a 90-degree angle (right angle) with your palm facing the floor. Bend your wrist forward so your fingers point toward the floor (flexion). Hold this position for 10 seconds. Slowly return to the starting position. Repeat 10 times. Complete this exercise 1-2 times a day. Wrist extension Bend your left / right elbow to a 90-degree angle (right angle) with your palm facing the floor. Bend your wrist backward so your fingers point toward   the ceiling (extension). Hold this position for 10 seconds. Slowly return to the starting position. Repeat 10 times. Complete this exercise 1-2 times a day. Ulnar deviation Bend your left / right elbow to a 90-degree angle (right angle), and rest your forearm on a table with your palm facing down. Keeping your hand flat on the table, bend your left / right wrist toward your small finger (pinkie). This is ulnar deviation. Hold this position for 10 seconds. Slowly return to the starting position. Repeat 10 times. Complete this exercise 1-2 times a day. Radial deviation Bend your left / right elbow to a 90-degree angle (right angle), and rest your forearm on a table with your palm facing down. Keeping your hand flat on the table, bend your left / right wrist toward your thumb. This is radial deviation. Hold this  position for 10 seconds. Slowly return to the starting position. Repeat 10 times. Complete this exercise 1-2 times a day. Forearm rotation, supination Stand or sit with your left / right elbow bent to a 90-degree angle (right angle) at your side. Position your forearm so that the thumb is facing the ceiling (neutral position). Turn (rotate) your palm up toward the ceiling (supination), stopping when you feel a gentle stretch. Hold this position for 10 seconds. Slowly return to the starting position. Repeat 10 times. Complete this exercise 1-2 times a day. Forearm rotation, pronation Stand or sit with your left / right elbow bent to a 90-degree angle (right angle) at your side. Position your forearm so that the thumb is facing the ceiling (neutral position). Turn (rotate) your palm down toward the floor (pronation), stopping when you feel a gentle stretch. Hold this position for 10 seconds. Slowly return to the starting position. Repeat 10 times. Complete this exercise 1-2 times a day. Wrist flexion stretch  Extend your left / right arm in front of you and turn your palm down toward the floor. If told by your health care provider, bend your left / right arm to a 90-degree angle (right angle) at your side. Using your uninjured hand, gently press over the back of your left / right hand to bend your wrist and fingers toward the floor (flexion). Go as far as you can to feel a stretch without causing pain. Hold this position for 10 seconds. Slowly return to the starting position. Repeat 10 times. Complete this exercise 1-2 times a day. Wrist extension stretch  Extend your left / right arm in front of you and turn your palm up toward the ceiling. If told by your health care provider, bend your left / right arm to a 90-degree angle (right angle) at your side. Using your uninjured hand, gently press over the palm of your left / right hand to bend your wrist and fingers toward the floor (extension).  Go as far as you can to feel a stretch without causing pain. Hold this position for 10 seconds. Slowly return to the starting position. Repeat 10 times. Complete this exercise 1-2 times a day. Forearm rotation stretch, supination Stand or sit with your arms at your sides. Bend your left / right elbow to a 90-degree angle (right angle). Using your uninjured hand, turn your left / right palm up toward the ceiling (assisted supination) until you feel a gentle stretch in the inside of your forearm. Hold this position for 10 seconds. Slowly return to the starting position. Repeat 10 times. Complete this exercise 1-2 times a day. Forearm rotation stretch, pronation Stand or   sit with your arms at your sides. Bend your left / right elbow to a 90-degree angle (right angle). Using your uninjured hand, turn your left / right palm down toward the floor (assisted pronation) until you feel a gentle stretch in the top of your forearm. Hold this position for 10 seconds. Slowly return to the starting position. Repeat 10 times. Complete this exercise 1-2 times a day. Strengthening exercises These exercises build strength and endurance in your wrist and hand. Enduranceis the ability to use your muscles for a long time, even after they get tired. Wrist flexion Sit with your left / right forearm supported on a table. Your elbow should be at waist height. Rest your hand over the edge of the table, palm up. Gently grasp a 5 lb / kg weight (can of soup). Or, hold an exercise band or tube in both hands, keeping your hands at the same level and hip distance apart. There should be slight tension in the exercise band or tube. Without moving your forearm or elbow, slowly bend your wrist up toward the ceiling (wrist flexion). Hold this position for 10 seconds. Slowly return to the starting position. Repeat 10 times. Complete this exercise 1-2 times a day. Wrist extension Sit with your left / right forearm supported  on a table. Your elbow should be at waist height. Rest your hand over the edge of the table, palm down. Gently grasp a 5 lb / kg weight. Or, hold an exercise band or tube in both hands, keeping your hands at the same level and hip distance apart. There should be slight tension in the exercise band or tube. Without moving your forearm or elbow, slowly curl your hand up toward the ceiling (extension). Hold this position for 10 seconds. Slowly return to the starting position. Repeat 10 times. Complete this exercise 1-2 times a day. Forearm rotation, supination  Sit with your left / right forearm supported on a table. Your elbow should be at waist height. Rest your hand over the edge of the table, palm down. Gently grasp a lightweight hammer near the head. As this exercise gets easier for you, try holding the hammer farther down the handle. Without moving your elbow, slowly turn (rotate) your palm up toward the ceiling (supination). Hold this position for 10 seconds. Slowly return to the starting position. Repeat 10 times. Complete this exercise 1-2 times a day. Forearm rotation, pronation  Sit with your left / right forearm supported on a table. Your elbow should be at waist height. Rest your hand over the edge of the table, palm up. Gently grasp a lightweight hammer near the head. As this exercise gets easier for you, try holding the hammer farther down the handle. Without moving your elbow, slowly turn (rotate) your palm down toward the floor (pronation). Hold this position for 10 seconds. Slowly return to the starting position. Repeat 10 times. Complete this exercise 1-2 times a day. Grip strengthening  Hold one of these items in your left / right hand: a dense sponge, a stress ball, or a large, rolled sock. Slowly squeeze the object as hard as you can without increasing any pain. Hold your squeeze for 10 seconds. Slowly release your grip. Repeat 10 times. Complete this exercise 1-2  times a day. This information is not intended to replace advice given to you by your health care provider. Make sure you discuss any questions you have with your healthcare provider. Document Revised: 04/21/2020 Document Reviewed: 04/21/2020 [employer] Patient Education    2022 [employer]  

## 2024-01-09 NOTE — Telephone Encounter (Signed)
PA submitted to [facility] 01/09/24

## 2024-01-13 ENCOUNTER — Ambulatory Visit: Payer: 59 | Admitting: Family Medicine

## 2024-01-15 ENCOUNTER — Ambulatory Visit: Payer: 59 | Admitting: Family Medicine

## 2024-01-22 ENCOUNTER — Telehealth: Payer: Self-pay | Admitting: Family Medicine

## 2024-01-22 NOTE — Telephone Encounter (Signed)
Dexcom approved

## 2024-01-30 LAB — LIPID PANEL
Chol/HDL Ratio: 2.6 {ratio} (ref 0.0–4.4)
Cholesterol, Total: 138 mg/dL (ref 100–199)
HDL: 53 mg/dL (ref >39)
LDL Chol Calc (NIH): 69 mg/dL (ref 0–99)
Triglycerides: 82 mg/dL (ref 0–149)
VLDL Cholesterol Cal: 16 mg/dL (ref 5–40)

## 2024-01-30 LAB — TSH+FREE T4
Free T4: 1.15 ng/dL (ref 0.82–1.77)
TSH: 2.89 u[IU]/mL (ref 0.450–4.500)

## 2024-01-30 LAB — VITAMIN D 25 HYDROXY (VIT D DEFICIENCY, FRACTURES): Vit D, 25-Hydroxy: 48.9 ng/mL (ref 30.0–100.0)

## 2024-02-02 NOTE — Progress Notes (Signed)
 Please inform the patient that her labs are stable

## 2024-02-03 ENCOUNTER — Other Ambulatory Visit: Payer: Self-pay | Admitting: Family Medicine

## 2024-02-03 DIAGNOSIS — F419 Anxiety disorder, unspecified: Secondary | ICD-10-CM

## 2024-02-03 DIAGNOSIS — B009 Herpesviral infection, unspecified: Secondary | ICD-10-CM

## 2024-02-03 DIAGNOSIS — N3281 Overactive bladder: Secondary | ICD-10-CM

## 2024-02-04 ENCOUNTER — Other Ambulatory Visit: Payer: Self-pay | Admitting: Family Medicine

## 2024-02-04 DIAGNOSIS — E559 Vitamin D deficiency, unspecified: Secondary | ICD-10-CM

## 2024-02-12 ENCOUNTER — Other Ambulatory Visit: Payer: Self-pay | Admitting: Family Medicine

## 2024-02-12 DIAGNOSIS — N3281 Overactive bladder: Secondary | ICD-10-CM

## 2024-02-13 ENCOUNTER — Encounter: Payer: Self-pay | Admitting: Urology

## 2024-02-13 ENCOUNTER — Ambulatory Visit: Payer: Medicare PPO | Admitting: Urology

## 2024-02-13 VITALS — BP 138/69 | HR 79 | Wt 166.0 lb

## 2024-02-13 DIAGNOSIS — R339 Retention of urine, unspecified: Secondary | ICD-10-CM | POA: Diagnosis not present

## 2024-02-13 DIAGNOSIS — N3941 Urge incontinence: Secondary | ICD-10-CM | POA: Diagnosis not present

## 2024-02-13 DIAGNOSIS — N3281 Overactive bladder: Secondary | ICD-10-CM

## 2024-02-13 LAB — BLADDER SCAN AMB NON-IMAGING: Scan Result: 35

## 2024-02-13 MED ORDER — MIRABEGRON ER 25 MG PO TB24: 25.0000 mg | ORAL_TABLET | Freq: Every day | ORAL | 3 refills | Status: DC

## 2024-02-13 NOTE — Progress Notes (Signed)
 Bladder Scan completed today.  Patient cannot void prior to the bladder scan. Bladder scan result: 35 ml  Performed By: Guss Bunde, CMA  Additional notes-  MD to see after

## 2024-02-13 NOTE — Progress Notes (Unsigned)
 Patient ID: [name], female   DOB: Jan 22, 1951, 73 y.o.   MRN: [id number]  Subjective:  1. Urge incontinence   2. Overactive bladder     02/13/24: [name] returns today in f/u.  She is currently on trospium 20mg  bid and  Myrbetriq 25mg  with some benefit.  She has large volume voids.  She is wearing 1 pull up during the day and 1 heavy pad at night.  She has lost about 80lbs.  She has had no hematuria or dysuria.    09/13/22: [name] returns today in f/u for her history of incontinence.  She has completed PTNS without benefit.  She is not sure she is responding to [place]. She uses about 30 ppd.  She leaks at night as well.  She feels she empties but will leak soon after voiding.  She never had urodynamics.  She has no dysuria or hematuria.  She has had a a hysterectomy and both overies and tubes removed.  She is a diabetic with some neuropathy.   Her PVR today is .  UA is unremarkable.      ROS:  ROS:  A complete review of systems was performed.  All systems are negative except for pertinent findings as noted.   Review of Systems  Constitutional:  Positive for malaise/fatigue.  HENT:  Positive for congestion.   Respiratory:  Positive for cough.   Gastrointestinal:  Positive for constipation.  Musculoskeletal:  Positive for back pain and joint pain.  Neurological:  Positive for weakness.  Endo/Heme/Allergies:  Positive for polydipsia. Bruises/bleeds easily.  Psychiatric/Behavioral:  Positive for memory loss.     Allergies  Allergen Reactions   Ambien [Zolpidem Tartrate] Other (See Comments)    Hallucinations/nightmares   Bee Venom Swelling   Coconut Flavoring Agent (Non-Screening) Swelling    Mouth swells   Codeine Nausea And Vomiting   Onion Nausea Only and Other (See Comments)    Raw onions   Other     Ground Beef (patient avoid due to side effect of diarrhea)   Sulfa Antibiotics Rash and Other (See Comments)    Including topical sulfa  (mouth dries/peels/skin  blisters)    Outpatient Encounter Medications as of 02/13/2024  Medication Sig Note   acyclovir (ZOVIRAX) 400 MG tablet TAKE 1 TABLET BY MOUTH EVERY TWELVE HOURS (AT BREAKFAST AND AT BEDTIME)    alendronate (FOSAMAX) 70 MG tablet TAKE 1 TABLET BY MOUTH ONCE A WEEK, TAKE with full GLASS of water ON an EMPTY stomach    Apple Cider Vinegar 300 MG TABS Take by mouth.    aspirin 81 MG chewable tablet Chew 1 tablet (81 mg total) by mouth 2 (two) times daily.    atorvastatin (LIPITOR) 20 MG tablet TAKE 1 TABLET BY MOUTH AT BEDTIME    Blood Pressure Monitoring (BLOOD PRESSURE MONITOR/L CUFF) MISC Dx: R03.0, elevated bp in office    cholecalciferol (VITAMIN D3) 25 MCG (1000 UNIT) tablet Take 1,000 Units by mouth daily. 03/07/2023: Patient states that she is taking 5000 units a week   Continuous Glucose Sensor (DEXCOM G7 SENSOR) MISC USE AS DIRECTED TO test BLOOD SUGAR FOUR TIMES DAILY. CHANGE sensor EVERY 10 DAYS    donepezil (ARICEPT) 10 MG tablet TAKE 1 TABLET BY MOUTH EVERY DAY    EPINEPHrine 0.3 mg/0.3 mL IJ SOAJ injection Inject 0.3 mg into the muscle as needed for anaphylaxis.    escitalopram (LEXAPRO) 20 MG tablet TAKE 1 TABLET BY MOUTH EVERY DAY    glucose blood (TRUE  METRIX BLOOD GLUCOSE TEST) test strip USE AS DIRECTED    insulin glargine (LANTUS) 100 UNIT/ML Solostar Pen Inject 35 Units into the skin at bedtime.    insulin lispro (HUMALOG) 100 UNIT/ML KwikPen Inject 6-12 Units into the skin 3 (three) times daily.    Insulin Pen Needle (BD AUTOSHIELD DUO) 30G X 5 MM MISC USE AS DIRECTED    levothyroxine (SYNTHROID) 88 MCG tablet TAKE 1 TABLET BY MOUTH ONCE A DAY BEFORE BREAKFAST    loperamide (IMODIUM) 2 MG capsule Take 2 mg by mouth See admin instructions. Take 2 mg PO after each loose stool as needed for diarrhea (max of 3 doses in 24 hours)    methocarbamol (ROBAXIN) 500 MG tablet Take 1 tablet (500 mg total) by mouth 2 (two) times daily.    nystatin cream (MYCOSTATIN) apply TO SKIN folds,  UNDER breasts, abdominal folds, AND inguinal folds TWICE DAILY FOR candidasis    pantoprazole (PROTONIX) 40 MG tablet Take 1 tablet (40 mg total) by mouth 2 (two) times daily.    prazosin (MINIPRESS) 2 MG capsule TAKE ONE CAPSULE BY MOUTH AT BEDTIME    predniSONE (DELTASONE) 20 MG tablet Take 1 tablet (20 mg total) by mouth 2 (two) times daily with a meal.    Semaglutide (RYBELSUS) 14 MG TABS Take 1 tablet (14 mg total) by mouth daily.    Specialty Vitamins Products (COLLAGEN ULTRA PO) Take by mouth.    trospium (SANCTURA) 20 MG tablet TAKE 1 TABLET BY MOUTH TWICE DAILY    vitamin B-12 (CYANOCOBALAMIN) 1000 MCG tablet Take 1,000 mcg by mouth daily.    Vitamin D, Ergocalciferol, (DRISDOL) 1.25 MG (50000 UNIT) CAPS capsule TAKE ONE CAPSULE BY MOUTH ONCE WEEKLY ON TUESDAY    [DISCONTINUED] MYRBETRIQ 25 MG TB24 tablet TAKE 1 TABLET BY MOUTH DAILY    mirabegron ER (MYRBETRIQ) 25 MG TB24 tablet Take 1 tablet (25 mg total) by mouth daily.    No facility-administered encounter medications on file as of 02/13/2024.    Past Medical History:  Diagnosis Date   Anemia    as a teenager   Anxiety    Arthritis    knees, rt hand,hips   Cancer (HCC)    vaginal,uterine,ovariam   COPD (chronic obstructive pulmonary disease) (HCC)    Depression    Diabetes mellitus without complication (HCC)    Hypothyroidism    Neuromuscular disorder (HCC)    hands and feet    Past Surgical History:  Procedure Laterality Date   ABDOMINAL HYSTERECTOMY     BIOPSY  08/06/2023   Procedure: BIOPSY;  Surgeon: [name], DO;  Location: [facility];  Service: Endoscopy;;   BREAST SURGERY Right 1988   lumpectomy   COLONOSCOPY WITH PROPOFOL N/A 08/06/2023   Procedure: COLONOSCOPY WITH PROPOFOL;  Surgeon: [name], DO;  Location: [facility];  Service: Endoscopy;  Laterality: N/A;  900am, asa 2   ESOPHAGOGASTRODUODENOSCOPY (EGD) WITH PROPOFOL N/A 08/06/2023   Procedure: ESOPHAGOGASTRODUODENOSCOPY (EGD)  WITH PROPOFOL;  Surgeon: [name], DO;  Location: [facility];  Service: Endoscopy;  Laterality: N/A;   EYE SURGERY Bilateral    FRACTURE SURGERY     Rt ankle,lt knee cap,lt shoulder,rt wrist,rt shoulder,   POLYPECTOMY  08/06/2023   Procedure: POLYPECTOMY;  Surgeon: [name], DO;  Location: [facility];  Service: Endoscopy;;   TONSILLECTOMY     at age 89   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   Procedure: TOTAL KNEE ARTHROPLASTY;  Surgeon: Frederico Hamman, MD;  Location: [facility];  Service: Orthopedics;  Laterality: Right;    Social History   Socioeconomic History   Marital status: Married    Spouse name: Not on file   Number of children: Not on file   Years of education: Not on file   Highest education level: Not on file  Occupational History   Not on file  Tobacco Use   Smoking status: Former    Current packs/day: 0.00    Average packs/day: 0.5 packs/day for 15.0 years (7.5 ttl pk-yrs)    Types: Cigarettes    Start date: 52    Quit date: 2013    Years since quitting: 12.1   Smokeless tobacco: Never  Vaping Use   Vaping status: Never Used  Substance and Sexual Activity   Alcohol use: Never   Drug use: Never   Sexual activity: Not on file  Other Topics Concern   Not on file  Social History Narrative   Not on file   Social Drivers of Health   Financial Resource Strain: Low Risk  (07/25/2023)   Overall Financial Resource Strain (CARDIA)    Difficulty of Paying Living Expenses: Not hard at all  Food Insecurity: No Food Insecurity (07/25/2023)   Hunger Vital Sign    Worried About Running Out of Food in the Last Year: Never true    Ran Out of Food in the Last Year: Never true  Transportation Needs: No Transportation Needs (07/25/2023)   PRAPARE - Administrator, Civil Service (Medical): No    Lack of Transportation (Non-Medical): No  Physical Activity: Sufficiently Active (07/25/2023)   Exercise Vital Sign    Days of Exercise per Week: 7  days    Minutes of Exercise per Session: 30 min  Stress: Stress Concern Present (07/25/2023)   [employer] of Occupational Health - Occupational Stress Questionnaire    Feeling of Stress : Very much  Social Connections: Moderately Isolated (07/25/2023)   Social Connection and Isolation Panel [NHANES]    Frequency of Communication with Friends and Family: More than three times a week    Frequency of Social Gatherings with Friends and Family: More than three times a week    Attends Religious Services: Never    Database administrator or Organizations: No    Attends Banker Meetings: Never    Marital Status: Married  Catering manager Violence: Not At Risk (07/25/2023)   Humiliation, Afraid, Rape, and Kick questionnaire    Fear of Current or Ex-Partner: No    Emotionally Abused: No    Physically Abused: No    Sexually Abused: No    Family History  Problem Relation Age of Onset   Cancer Mother    Hypertension Mother    Thyroid disease Mother    Diabetes Mother    Stroke Mother    Heart failure Mother    Diabetes Father    Heart attack Father    Diabetes Sister    Diabetes Brother        Objective: Vitals:   02/13/24 1413  BP: 138/69  Pulse: 79     Physical Exam Vitals reviewed.  Constitutional:      Appearance: Normal appearance.  Genitourinary:    Comments: Nl ext genitalia. Mild introital stenosis with moderate atrophy. Normal meatus. No urethral hypermobility or leakage. No cystocele or rectocele.   Neurological:     Mental Status: She is alert.     Lab Results:  PSA No results found for: "PSA" No results found for: "TESTOSTERONE"    Studies/Results: No results found.     Assessment & Plan: UUI.  She is doing better with myrbetriq and trospium.  .   Incomplete bladder emptying.  She is feeling like she is emptying completely.  Her PVR is 35ml.   Vaginal atrophy.    She has moderate atrophy.      Meds ordered this encounter   Medications   mirabegron ER (MYRBETRIQ) 25 MG TB24 tablet    Sig: Take 1 tablet (25 mg total) by mouth daily.    Dispense:  90 tablet    Refill:  3    Please replace the 30 day refill with this.     Orders Placed This Encounter  Procedures   BLADDER SCAN AMB NON-IMAGING      Return in about 1 year (around 02/12/2025) for f/u with sarah with a PVR. Marland Kitchen   CC: Gilmore Laroche, FNP      Bjorn Pippin 02/14/2024  Patient ID: Kathryne Sharper, female   DOB: [birth year], 73 y.o.   MRN: [id number]

## 2024-02-17 ENCOUNTER — Encounter (HOSPITAL_COMMUNITY): Payer: Self-pay | Admitting: Emergency Medicine

## 2024-02-17 ENCOUNTER — Telehealth: Payer: Self-pay

## 2024-02-17 ENCOUNTER — Emergency Department (HOSPITAL_COMMUNITY): Payer: Medicare PPO

## 2024-02-17 ENCOUNTER — Other Ambulatory Visit: Payer: Self-pay

## 2024-02-17 ENCOUNTER — Emergency Department (HOSPITAL_COMMUNITY)
Admission: EM | Admit: 2024-02-17 | Discharge: 2024-02-18 | Disposition: A | Discharge status: Home / Self Care | Payer: Medicare PPO | Attending: Emergency Medicine | Admitting: Emergency Medicine

## 2024-02-17 DIAGNOSIS — Z7982 Long term (current) use of aspirin: Secondary | ICD-10-CM | POA: Insufficient documentation

## 2024-02-17 DIAGNOSIS — R079 Chest pain, unspecified: Secondary | ICD-10-CM | POA: Insufficient documentation

## 2024-02-17 LAB — TROPONIN I (HIGH SENSITIVITY)
Troponin I (High Sensitivity): 2 ng/L (ref <18)
Troponin I (High Sensitivity): 2 ng/L (ref <18)

## 2024-02-17 LAB — CBC
HCT: 35.3 % — ABNORMAL LOW (ref 36.0–46.0)
Hemoglobin: 11.3 g/dL — ABNORMAL LOW (ref 12.0–15.0)
MCH: 29.4 pg (ref 26.0–34.0)
MCHC: 32 g/dL (ref 30.0–36.0)
MCV: 91.9 fL (ref 80.0–100.0)
Platelets: 192 10*3/uL (ref 150–400)
RBC: 3.84 MIL/uL — ABNORMAL LOW (ref 3.87–5.11)
RDW: 13.5 % (ref 11.5–15.5)
WBC: 4.5 10*3/uL (ref 4.0–10.5)
nRBC: 0 % (ref 0.0–0.2)

## 2024-02-17 LAB — BASIC METABOLIC PANEL
Anion gap: 9 (ref 5–15)
BUN: 9 mg/dL (ref 8–23)
CO2: 23 mmol/L (ref 22–32)
Calcium: 8.7 mg/dL — ABNORMAL LOW (ref 8.9–10.3)
Chloride: 105 mmol/L (ref 98–111)
Creatinine, Ser: 0.67 mg/dL (ref 0.44–1.00)
GFR, Estimated: >60 mL/min (ref >60)
Glucose, Bld: 164 mg/dL — ABNORMAL HIGH (ref 70–99)
Potassium: 3.5 mmol/L (ref 3.5–5.1)
Sodium: 137 mmol/L (ref 135–145)

## 2024-02-17 LAB — CBG MONITORING, ED
Glucose-Capillary: 59 mg/dL — ABNORMAL LOW (ref 70–99)
Glucose-Capillary: 72 mg/dL (ref 70–99)

## 2024-02-17 NOTE — ED Provider Notes (Signed)
 [place] EMERGENCY DEPARTMENT AT [facility] Provider Note   CSN: [id number] Arrival date & time: 02/17/24  1546     History  Chief Complaint  Patient presents with   Chest Pain    [name] is a 73 y.o. female.  Patient reports that she had an episode of chest pain that began this morning patient reports that her brother advised her to call EMS.  Patient reports that she was seen at [facility].  Patient had evaluation there.  Patient states she felt like she was not okay to go home so she came here for evaluation.  Patient reports having a pain in her mid chest and feeling like she was seeing a bright light.  Patient tells me that she lives alone and is concerned that she should not be alone.  Patient tells me she has been speaking with her daughter about staying at her daughter's home or trying to find an assisted living facility.  Patient reports that when she had the chest pain she dropped to her knees.  Patient states she did not fall.  Patient was able to get up.  She did not injure herself.  Patient states that she has been depressed recently.  Patient states that her husband is in a nursing home.  Patient states that she has experienced the death of multiple family members.  Patient states that she and her brother and sister live close but cannot help take care of her.  The history is provided by the patient. No language interpreter was used.  Chest Pain      Home Medications Prior to Admission medications   Medication Sig Start Date End Date Taking? Authorizing Provider  acyclovir (ZOVIRAX) 400 MG tablet TAKE 1 TABLET BY MOUTH EVERY TWELVE HOURS (AT BREAKFAST AND AT BEDTIME) 02/03/24   Gilmore Laroche, FNP  alendronate (FOSAMAX) 70 MG tablet TAKE 1 TABLET BY MOUTH ONCE A WEEK, TAKE with full GLASS of water ON an EMPTY stomach 10/04/23   Gilmore Laroche, FNP  Apple Cider Vinegar 300 MG TABS Take by mouth.    [provider]  aspirin 81 MG  chewable tablet Chew 1 tablet (81 mg total) by mouth 2 (two) times daily. 09/14/19   Chadwell, Ivin Booty, PA-C  atorvastatin (LIPITOR) 20 MG tablet TAKE 1 TABLET BY MOUTH AT BEDTIME 11/04/23   Gilmore Laroche, FNP  Blood Pressure Monitoring (BLOOD PRESSURE MONITOR/L CUFF) MISC Dx: R03.0, elevated bp in office 08/01/22   Gilmore Laroche, FNP  cholecalciferol (VITAMIN D3) 25 MCG (1000 UNIT) tablet Take 1,000 Units by mouth daily.    [provider]  Continuous Glucose Sensor (DEXCOM G7 SENSOR) MISC USE AS DIRECTED TO test BLOOD SUGAR FOUR TIMES DAILY. CHANGE sensor EVERY 10 DAYS 01/07/24   Gilmore Laroche, FNP  donepezil (ARICEPT) 10 MG tablet TAKE 1 TABLET BY MOUTH EVERY DAY 11/04/23   Gilmore Laroche, FNP  EPINEPHrine 0.3 mg/0.3 mL IJ SOAJ injection Inject 0.3 mg into the muscle as needed for anaphylaxis. 05/24/22   Gilmore Laroche, FNP  escitalopram (LEXAPRO) 20 MG tablet TAKE 1 TABLET BY MOUTH EVERY DAY 02/03/24   Del Nigel Berthold, FNP  glucose blood (TRUE METRIX BLOOD GLUCOSE TEST) test strip USE AS DIRECTED 08/08/22   Gilmore Laroche, FNP  insulin glargine (LANTUS) 100 UNIT/ML Solostar Pen Inject 35 Units into the skin at bedtime. 11/18/23   Dani Gobble, NP  insulin lispro (HUMALOG) 100 UNIT/ML KwikPen Inject 6-12 Units into the skin 3 (three)  times daily. 11/18/23   Dani Gobble, NP  Insulin Pen Needle (BD AUTOSHIELD DUO) 30G X 5 MM MISC USE AS DIRECTED 07/18/23   Gilmore Laroche, FNP  levothyroxine (SYNTHROID) 88 MCG tablet TAKE 1 TABLET BY MOUTH ONCE A DAY BEFORE BREAKFAST 01/02/24   Gilmore Laroche, FNP  loperamide (IMODIUM) 2 MG capsule Take 2 mg by mouth See admin instructions. Take 2 mg PO after each loose stool as needed for diarrhea (max of 3 doses in 24 hours) 09/29/21   [provider]  methocarbamol (ROBAXIN) 500 MG tablet Take 1 tablet (500 mg total) by mouth 2 (two) times daily. 08/06/22   Gilmore Laroche, FNP  mirabegron ER (MYRBETRIQ) 25 MG TB24 tablet Take 1  tablet (25 mg total) by mouth daily. 02/13/24   Bjorn Pippin, MD  nystatin cream (MYCOSTATIN) apply TO SKIN folds, UNDER breasts, abdominal folds, AND inguinal folds TWICE DAILY FOR candidasis 07/18/23   Gilmore Laroche, FNP  pantoprazole (PROTONIX) 40 MG tablet Take 1 tablet (40 mg total) by mouth 2 (two) times daily. 10/01/23   Gilmore Laroche, FNP  prazosin (MINIPRESS) 2 MG capsule TAKE ONE CAPSULE BY MOUTH AT BEDTIME 11/04/23   Gilmore Laroche, FNP  predniSONE (DELTASONE) 20 MG tablet Take 1 tablet (20 mg total) by mouth 2 (two) times daily with a meal. 07/11/22   Kerri Perches, MD  Semaglutide (RYBELSUS) 14 MG TABS Take 1 tablet (14 mg total) by mouth daily. 06/17/23   Dani Gobble, NP  Specialty Vitamins Products (COLLAGEN ULTRA PO) Take by mouth.    [provider]  trospium (SANCTURA) 20 MG tablet TAKE 1 TABLET BY MOUTH TWICE DAILY 12/08/23   Bjorn Pippin, MD  vitamin B-12 (CYANOCOBALAMIN) 1000 MCG tablet Take 1,000 mcg by mouth daily.    [provider]  Vitamin D, Ergocalciferol, (DRISDOL) 1.25 MG (50000 UNIT) CAPS capsule TAKE ONE CAPSULE BY MOUTH ONCE WEEKLY ON TUESDAY 02/04/24   Gilmore Laroche, FNP      Allergies    Ambien [zolpidem tartrate], Bee venom, Coconut flavoring agent (non-screening), Codeine, Onion, Other, and Sulfa antibiotics    Review of Systems   Review of Systems  Cardiovascular:  Positive for chest pain.  All other systems reviewed and are negative.   Physical Exam Updated Vital Signs BP 133/64   Pulse (!) 54   Temp 98.3 F (36.8 C) (Oral)   Resp 16   SpO2 99%  Physical Exam Vitals and nursing note reviewed.  Constitutional:      Appearance: She is well-developed.  HENT:     Head: Normocephalic.  Cardiovascular:     Rate and Rhythm: Normal rate and regular rhythm.     Heart sounds: Normal heart sounds.  Pulmonary:     Effort: Pulmonary effort is normal.     Breath sounds: Normal breath sounds.  Abdominal:     General:  There is no distension.     Palpations: Abdomen is soft.  Musculoskeletal:        General: Normal range of motion.     Cervical back: Normal range of motion.  Skin:    General: Skin is warm.  Neurological:     General: No focal deficit present.     Mental Status: She is alert and oriented to person, place, and time.     ED Results / Procedures / Treatments   Labs (all labs ordered are listed, but only abnormal results are displayed) Labs Reviewed  BASIC METABOLIC PANEL - Abnormal; Notable  for the following components:      Result Value   Glucose, Bld 164 (*)    Calcium 8.7 (*)    All other components within normal limits  CBC - Abnormal; Notable for the following components:   RBC 3.84 (*)    Hemoglobin 11.3 (*)    HCT 35.3 (*)    All other components within normal limits  CBG MONITORING, ED - Abnormal; Notable for the following components:   Glucose-Capillary 59 (*)    All other components within normal limits  CBG MONITORING, ED  POC OCCULT BLOOD, ED  TROPONIN I (HIGH SENSITIVITY)  TROPONIN I (HIGH SENSITIVITY)    EKG EKG Interpretation Date/Time:  Monday February 17 2024 16:05:31 EST Ventricular Rate:  67 PR Interval:  152 QRS Duration:  90 QT Interval:  422 QTC Calculation: 445 R Axis:   38  Text Interpretation: Normal sinus rhythm Nonspecific T wave abnormality Abnormal ECG No previous ECGs available No acute changes No significant change since last tracing Confirmed by Derwood Kaplan [phone]) on 02/17/2024 7:36:52 PM  Radiology DG Chest 2 View Result Date: 02/17/2024 CLINICAL DATA:  Chest pain since this morning, nausea EXAM: CHEST - 2 VIEW COMPARISON:  02/17/2024 FINDINGS: Frontal and lateral views of the chest demonstrate an unremarkable cardiac silhouette. No airspace disease, effusion, or pneumothorax. No acute bony abnormalities. IMPRESSION: 1. Stable chest, no acute process. Electronically Signed   By: Sharlet Salina M.D.   On: 02/17/2024 17:56     Procedures Procedures    Medications Ordered in ED Medications - No data to display  ED Course/ Medical Decision Making/ A&P                                 Medical Decision Making Patient complains of overall weakness and depression.  Amount and/or Complexity of Data Reviewed External Data Reviewed: notes.    Details: Notes from earlier today at [facility] show patient had a normal CT head and laboratory evaluations. Labs: ordered. Decision-making details documented in ED Course.    Details: Labs ordered reviewed and interpreted.  Patient has 2 negative troponins. Radiology: ordered and independent interpretation performed. Decision-making details documented in ED Course.    Details: Chest x-ray shows no acute cardiopulmonary disease. ECG/medicine tests: ordered and independent interpretation performed. Decision-making details documented in ED Course.    Details: EKG shows normal sinus normal EKG           Final Clinical Impression(s) / ED Diagnoses Final diagnoses:  Nonspecific chest pain    Rx / DC Orders ED Discharge Orders     None     Patient is able to eat and drink.  Patient is able to ambulate to the bathroom without difficulty.  I doubt cardiac or respiratory illness as cause of chest pain. An After Visit Summary was printed and given to the patient.     Elson Areas, PA-C 02/17/24 2353    Derwood Kaplan, MD 02/18/24 [phone]

## 2024-02-17 NOTE — ED Triage Notes (Signed)
 Pt comes in after leaving [facility] with eval for CP.  Pt states she had chest pain that began this morning.  Pt reports she does have nausea with the CP.  Pt was given a script for UTI while there, and had CP work up.  Pt does not feel her care there was adequate.

## 2024-02-17 NOTE — ED Notes (Signed)
 Pt walked to the bathroom and back to room with no complaints

## 2024-02-17 NOTE — ED Notes (Signed)
 ED Provider at bedside.

## 2024-02-17 NOTE — Telephone Encounter (Signed)
 Copied from CRM [id number]. Topic: Clinical - Medical Advice >> Feb 17, 2024  8:24 AM Patsy Lager T wrote: Reason for CRM: patients daughter called stated her mother fell this morning and was taken to [facility] in [place] but they are not doing anything for her. Daughter is wanting her to be moved to [facility]. Patient is afraid of being discharged and going back home by herself. Daughter said she was told to have patient assessed for a assisted living facility. Please f/u with daughter at [phone] to talk about how to get her mother moved before they discharge

## 2024-02-17 NOTE — ED Notes (Signed)
Patient given a sandwich and drink 

## 2024-02-18 ENCOUNTER — Telehealth: Payer: Self-pay

## 2024-02-18 ENCOUNTER — Telehealth: Payer: Self-pay | Admitting: Family Medicine

## 2024-02-18 NOTE — Telephone Encounter (Unsigned)
 Copied from CRM [id number]. Topic: Clinical - Medical Advice >> Feb 18, 2024  8:10 AM Gery Pray wrote: Reason for CRM: Daughter, Harlow Ohms, called wanting to know when the provider would see the patient. Patient is in a holding room at [facility] because they do not have rooms and the patient has dementia. Harlow Ohms stated that she spoke to a Child psychotherapist and they stated that the provider would have to sign off on paperwork for the patient to go into a skilled nursing home. [facility]'s callback number [phone].

## 2024-02-18 NOTE — Telephone Encounter (Signed)
 Patient daughter called requesting to speak to nurse she had called 2 x today already said mother has fell out of bed messed on herself.  concern she does not need to be home by herself tonight and the daughter has to work tonight teams sent out .

## 2024-02-18 NOTE — Telephone Encounter (Signed)
 Patient daughter Unice Cobble asking for a call back # [phone]

## 2024-02-20 ENCOUNTER — Telehealth: Payer: Self-pay | Admitting: Pharmacy Technician

## 2024-02-20 ENCOUNTER — Other Ambulatory Visit (HOSPITAL_COMMUNITY): Payer: Self-pay

## 2024-02-20 NOTE — Telephone Encounter (Signed)
 Pharmacy Patient Advocate Encounter   Received notification from CoverMyMeds that prior authorization for Dexcom G7 Sensor is required/requested.   Insurance verification completed.   The patient is insured through [employer] .   Per test claim: Refill too soon. PA is not needed at this time. Medication was filled 02/12/24. Next eligible fill date is 03/06/24. Looks like PA was previously obtained by another office.

## 2024-02-24 ENCOUNTER — Ambulatory Visit: Payer: Medicare PPO | Admitting: Family Medicine

## 2024-02-25 ENCOUNTER — Telehealth: Payer: Self-pay | Admitting: Family Medicine

## 2024-02-25 NOTE — Telephone Encounter (Signed)
 Copied from CRM [id number]. Topic: General - Other >> Feb 24, 2024 11:17 AM Elle L wrote: Reason for CRM: The patient's mother was in the hospital and diagnosed with dementia and had an appointment today to get her into assisted living and fill out a leave of absence form for her daughter that is due today. She has an appointment with DSS tomorrow at 10:30 and lawyer at 1 in [place] that can not be completed until paperwork is filled out and the patient's daughter is from out of town and will need to leave to return to work. She is unsure how to procceed with the office being closed. She is requesting a call back at [phone].

## 2024-02-25 NOTE — Telephone Encounter (Signed)
 Copied from CRM [id number]. Topic: General - Other >> Feb 25, 2024  8:18 AM Philippa Chester F wrote: Reason for CRM:  (Call was disconnected but patient's daughter Harlow Ohms is in need of a callback with assistance for a FMLA form for herself and Documents for her mother for DSS and to fill out POA paperwork so she can oversee her mother's care)   The daughter, Jolinda Croak , stated that the DSS office needs an order from the PCP to remove her from Medicare so she can be signed up for Medicaid to be able to go to assisted living ( )   DSS apppointment is:   Harlow Ohms received the message of the office closing due to her mother receiving a text but she cannot relay anything due to her health and demnetia disgnosis.  Symptoms:  Patient is frightened to be alone and desires to be in assisted living.  Speaking with: Jolinda Croak      Callback number: [phone]

## 2024-02-26 ENCOUNTER — Telehealth: Payer: Self-pay | Admitting: Family Medicine

## 2024-02-26 ENCOUNTER — Other Ambulatory Visit: Payer: Self-pay | Admitting: Family Medicine

## 2024-02-26 NOTE — Telephone Encounter (Signed)
 FYI    Copied from CRM [id number]. Topic: General - Other >> Feb 26, 2024  3:12 PM Turkey B wrote: Reason for CRM: pt's daugher called in states needs fl2 form filled out asap, because she already has a bed for the pt at facility she goes to , and doesn't want it to be gone before paperwork is filled out. I let her know about the 10 days standard time. She say she is coming to the office tomorrow

## 2024-02-26 NOTE — Telephone Encounter (Signed)
 Copied from CRM [id number]. Topic: Medical Record Request - Records Request >> Feb 25, 2024  2:39 PM Ivette P wrote: Reason for CRM: Pt is needing FL2 and the FMLA filled out, Pt has been calling in several times, and would like someone to call her back today.   Pt [phone]   Copied from Previous CRM:(Call was disconnected but patient's daughter Harlow Ohms is in need of a callback with assistance for a FMLA form for herself and Documents for her mother for DSS and to fill out POA paperwork so she can oversee her mother's care)    The daughter, Jolinda Croak , stated that the DSS office needs an order from the PCP to remove her from Medicare so she can be signed up for Medicaid to be able to go to assisted living ( )    DSS apppointment is:  Harlow Ohms received the message of the office closing due to her mother receiving a text but she cannot relay anything due to her health and demnetia disgnosis.  Symptoms:  Patient is frightened to be alone and desires to be in assisted living.  Speaking with: Jolinda Croak     Callback number: [phone]

## 2024-02-26 NOTE — Telephone Encounter (Signed)
FL2  Noted  Copied Sleeved  Original in PCP box Copy front desk folder

## 2024-02-26 NOTE — Telephone Encounter (Signed)
 Kindly schedule a virtual or in-person visit to discuss this further, as the last time I spoke with the patient in the clinic, she was opposed to assisted living.

## 2024-02-27 ENCOUNTER — Telehealth: Payer: Self-pay | Admitting: Family Medicine

## 2024-02-27 NOTE — Telephone Encounter (Signed)
 FMLA for daughter to help with her mom.  Continuations dates from  02.27.2025 til...  Will drop off the forms at our office.

## 2024-02-27 NOTE — Telephone Encounter (Signed)
Pt scheduled on 03/07

## 2024-02-27 NOTE — Telephone Encounter (Signed)
Scheduled in office appointment

## 2024-02-27 NOTE — Telephone Encounter (Signed)
 [employer] called asking can provider fill out FMLA for her job there are no openings til may 1st. Can provider approve a visit overbook? Patient daughter Caleb Popp # [phone] asking for a call back asap.

## 2024-02-28 ENCOUNTER — Telehealth: Payer: Self-pay | Admitting: Family Medicine

## 2024-02-28 ENCOUNTER — Ambulatory Visit (INDEPENDENT_AMBULATORY_CARE_PROVIDER_SITE_OTHER): Admitting: Family Medicine

## 2024-02-28 VITALS — BP 129/60 | HR 69 | Temp 97.7°F | Ht 67.0 in | Wt 164.8 lb

## 2024-02-28 DIAGNOSIS — E1100 Type 2 diabetes mellitus with hyperosmolarity without nonketotic hyperglycemic-hyperosmolar coma (NKHHC): Secondary | ICD-10-CM | POA: Diagnosis not present

## 2024-02-28 NOTE — Telephone Encounter (Signed)
 Patient daughter called asked to refax fmla for her mother back to  [facility]  Fax # [phone]

## 2024-02-28 NOTE — Patient Instructions (Addendum)
I appreciate the opportunity to provide care to you today!    Follow up:  4 months   Attached with your AVS, you will find valuable resources for self-education. I highly recommend dedicating some time to thoroughly examine them.   Please continue to a heart-healthy diet and increase your physical activities. Try to exercise for at least five days a week.    It was a pleasure to see you and I look forward to continuing to work together on your health and well-being. Please do not hesitate to call the office if you need care or have questions about your care.  In case of emergency, please visit the Emergency Department for urgent care, or contact our clinic at [phone] to schedule an appointment. We're here to help you!   Have a wonderful day and week. With Gratitude, Gilmore Laroche MSN, FNP-BC

## 2024-02-28 NOTE — Telephone Encounter (Signed)
Completed and faxed with confirmation.

## 2024-02-28 NOTE — Telephone Encounter (Signed)
 Pt's daughter called in about fmla and fl2 form, she states that fl2 forms aren't fully completed and needs answered and states her employer hasn't receive herFMLA papers.  She states pt will loose her bed if FL isnt fully completed. Please cb  [phone]

## 2024-03-01 NOTE — Assessment & Plan Note (Signed)
 Forms completed

## 2024-03-01 NOTE — Progress Notes (Signed)
 Established Patient Office Visit  Subjective:  Patient ID: [name], female    DOB: Jan 06, 1951  Age: 73 y.o. MRN: [id number]  CC:  Chief Complaint  Patient presents with   Hospitalization Follow-up    Here for hospital f/u after ER visit on 02/17/24.     HPI [name] is a 73 y.o. female presents for completion of FMLA and FL2 forms.   Past Medical History:  Diagnosis Date   Anemia    as a teenager   Anxiety    Arthritis    knees, rt hand,hips   Cancer (HCC)    vaginal,uterine,ovariam   COPD (chronic obstructive pulmonary disease) (HCC)    Depression    Diabetes mellitus without complication (HCC)    Hypothyroidism    Neuromuscular disorder (HCC)    hands and feet    Past Surgical History:  Procedure Laterality Date   ABDOMINAL HYSTERECTOMY     BIOPSY  08/06/2023   Procedure: BIOPSY;  Surgeon: [name], DO;  Location: [facility];  Service: Endoscopy;;   BREAST SURGERY Right 1988   lumpectomy   COLONOSCOPY WITH PROPOFOL N/A 08/06/2023   Procedure: COLONOSCOPY WITH PROPOFOL;  Surgeon: [name], DO;  Location: [facility];  Service: Endoscopy;  Laterality: N/A;  900am, asa 2   ESOPHAGOGASTRODUODENOSCOPY (EGD) WITH PROPOFOL N/A 08/06/2023   Procedure: ESOPHAGOGASTRODUODENOSCOPY (EGD) WITH PROPOFOL;  Surgeon: [name], DO;  Location: [facility];  Service: Endoscopy;  Laterality: N/A;   EYE SURGERY Bilateral    FRACTURE SURGERY     Rt ankle,lt knee cap,lt shoulder,rt wrist,rt shoulder,   POLYPECTOMY  08/06/2023   Procedure: POLYPECTOMY;  Surgeon: [name], DO;  Location: [facility];  Service: Endoscopy;;   TONSILLECTOMY     at age 73   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   Procedure: TOTAL KNEE ARTHROPLASTY;  Surgeon: Frederico Hamman, MD;  Location: [facility];  Service: Orthopedics;  Laterality: Right;    Family History  Problem Relation Age of Onset   Cancer Mother    Hypertension Mother    Thyroid  disease Mother    Diabetes Mother    Stroke Mother    Heart failure Mother    Diabetes Father    Heart attack Father    Diabetes Sister    Diabetes Brother     Social History   Socioeconomic History   Marital status: Married    Spouse name: Not on file   Number of children: Not on file   Years of education: Not on file   Highest education level: Not on file  Occupational History   Not on file  Tobacco Use   Smoking status: Former    Current packs/day: 0.00    Average packs/day: 0.5 packs/day for 15.0 years (7.5 ttl pk-yrs)    Types: Cigarettes    Start date: 49    Quit date: 2013    Years since quitting: 12.1   Smokeless tobacco: Never  Vaping Use   Vaping status: Never Used  Substance and Sexual Activity   Alcohol use: Never   Drug use: Never   Sexual activity: Not on file  Other Topics Concern   Not on file  Social History Narrative   Not on file   Social Drivers of Health   Financial Resource Strain: Low Risk  (07/25/2023)   Overall Financial Resource Strain (CARDIA)    Difficulty of Paying Living Expenses: Not hard at all  Food Insecurity: No Food Insecurity (07/25/2023)   Hunger Vital Sign    Worried About Running Out of Food in the Last Year: Never true    Ran Out of Food in the Last Year: Never true  Transportation Needs: No Transportation Needs (07/25/2023)   PRAPARE - Administrator, Civil Service (Medical): No    Lack of Transportation (Non-Medical): No  Physical Activity: Sufficiently Active (07/25/2023)   Exercise Vital Sign    Days of Exercise per Week: 7 days    Minutes of Exercise per Session: 30 min  Stress: Stress Concern Present (07/25/2023)   [employer] of Occupational Health - Occupational Stress Questionnaire    Feeling of Stress : Very much  Social Connections: Moderately Isolated (07/25/2023)   Social Connection and Isolation Panel [NHANES]    Frequency of Communication with Friends and Family: More than three times a week     Frequency of Social Gatherings with Friends and Family: More than three times a week    Attends Religious Services: Never    Database administrator or Organizations: No    Attends Banker Meetings: Never    Marital Status: Married  Catering manager Violence: Not At Risk (07/25/2023)   Humiliation, Afraid, Rape, and Kick questionnaire    Fear of Current or Ex-Partner: No    Emotionally Abused: No    Physically Abused: No    Sexually Abused: No    Outpatient Medications Prior to Visit  Medication Sig Dispense Refill   acyclovir (ZOVIRAX) 400 MG tablet TAKE 1 TABLET BY MOUTH EVERY TWELVE HOURS (AT BREAKFAST AND AT BEDTIME) 90 tablet 1   alendronate (FOSAMAX) 70 MG tablet TAKE 1 TABLET BY MOUTH ONCE A WEEK, TAKE with full GLASS of water ON an EMPTY stomach 4 tablet 11   Apple Cider Vinegar 300 MG TABS Take by mouth.     aspirin 81 MG chewable tablet Chew 1 tablet (81 mg total) by mouth 2 (two) times daily. 60 tablet 0   atorvastatin (LIPITOR) 20 MG tablet TAKE 1 TABLET BY MOUTH AT BEDTIME 90 tablet 1   Blood Pressure Monitoring (BLOOD PRESSURE MONITOR/L CUFF) MISC Dx: R03.0, elevated bp in office 1 each 0   cholecalciferol (VITAMIN D3) 25 MCG (1000 UNIT) tablet Take 1,000 Units by mouth daily.     Continuous Glucose Sensor (DEXCOM G7 SENSOR) MISC USE AS DIRECTED TO test BLOOD SUGAR FOUR TIMES DAILY. CHANGE sensor EVERY 10 DAYS 3 each 2   donepezil (ARICEPT) 10 MG tablet TAKE 1 TABLET BY MOUTH EVERY DAY 90 tablet 1   EPINEPHrine 0.3 mg/0.3 mL IJ SOAJ injection Inject 0.3 mg into the muscle as needed for anaphylaxis. 1 each 2   escitalopram (LEXAPRO) 20 MG tablet TAKE 1 TABLET BY MOUTH EVERY DAY 30 tablet 2   glucose blood (TRUE METRIX BLOOD GLUCOSE TEST) test strip USE AS DIRECTED 100 each 2   insulin glargine (LANTUS) 100 UNIT/ML Solostar Pen Inject 35 Units into the skin at bedtime. 30 mL 3   insulin lispro (HUMALOG) 100 UNIT/ML KwikPen Inject 6-12 Units into the skin 3  (three) times daily. 36 mL 3   Insulin Pen Needle (BD AUTOSHIELD DUO) 30G X 5 MM MISC USE AS DIRECTED 100 each 2   levothyroxine (SYNTHROID) 88 MCG tablet TAKE 1 TABLET BY MOUTH ONCE A DAY BEFORE BREAKFAST 90 tablet 1   loperamide (IMODIUM) 2 MG capsule Take 2 mg by mouth See admin instructions. Take 2 mg PO  after each loose stool as needed for diarrhea (max of 3 doses in 24 hours)     methocarbamol (ROBAXIN) 500 MG tablet Take 1 tablet (500 mg total) by mouth 2 (two) times daily. 30 tablet 0   mirabegron ER (MYRBETRIQ) 25 MG TB24 tablet Take 1 tablet (25 mg total) by mouth daily. 90 tablet 3   nystatin cream (MYCOSTATIN) apply TO SKIN folds, UNDER breasts, abdominal folds, AND inguinal folds TWICE DAILY FOR candidasis 30 g 1   pantoprazole (PROTONIX) 40 MG tablet Take 1 tablet (40 mg total) by mouth 2 (two) times daily. 90 tablet 1   prazosin (MINIPRESS) 2 MG capsule TAKE ONE CAPSULE BY MOUTH AT BEDTIME 90 capsule 1   predniSONE (DELTASONE) 20 MG tablet Take 1 tablet (20 mg total) by mouth 2 (two) times daily with a meal. 10 tablet 0   Semaglutide (RYBELSUS) 14 MG TABS Take 1 tablet (14 mg total) by mouth daily. 90 tablet 3   Specialty Vitamins Products (COLLAGEN ULTRA PO) Take by mouth.     trospium (SANCTURA) 20 MG tablet TAKE 1 TABLET BY MOUTH TWICE DAILY 120 tablet 5   vitamin B-12 (CYANOCOBALAMIN) 1000 MCG tablet Take 1,000 mcg by mouth daily.     Vitamin D, Ergocalciferol, (DRISDOL) 1.25 MG (50000 UNIT) CAPS capsule TAKE ONE CAPSULE BY MOUTH ONCE WEEKLY ON TUESDAY 10 capsule 1   No facility-administered medications prior to visit.    Allergies  Allergen Reactions   Ambien [Zolpidem Tartrate] Other (See Comments)    Hallucinations/nightmares   Bee Venom Swelling   Coconut Flavoring Agent (Non-Screening) Swelling    Mouth swells   Codeine Nausea And Vomiting   Onion Nausea Only and Other (See Comments)    Raw onions   Other     Ground Beef (patient avoid due to side effect of  diarrhea)   Sulfa Antibiotics Rash and Other (See Comments)    Including topical sulfa  (mouth dries/peels/skin blisters)    ROS Review of Systems  Constitutional:  Negative for chills and fever.  Eyes:  Negative for visual disturbance.  Respiratory:  Negative for chest tightness and shortness of breath.   Neurological:  Negative for dizziness and headaches.      Objective:    Physical Exam HENT:     Head: Normocephalic.     Mouth/Throat:     Mouth: Mucous membranes are moist.  Cardiovascular:     Rate and Rhythm: Normal rate.     Heart sounds: Normal heart sounds.  Pulmonary:     Effort: Pulmonary effort is normal.     Breath sounds: Normal breath sounds.  Neurological:     Mental Status: She is alert.     BP 129/60   Pulse 69   Temp 97.7 F (36.5 C)   Ht 5\' 7"  (1.702 m)   Wt 164 lb 12.8 oz (74.8 kg)   SpO2 97%   BMI 25.81 kg/m  Wt Readings from Last 3 Encounters:  02/28/24 164 lb 12.8 oz (74.8 kg)  02/13/24 166 lb (75.3 kg)  01/08/24 169 lb (76.7 kg)    Lab Results  Component Value Date   TSH 2.890 01/29/2024   Lab Results  Component Value Date   WBC 4.5 02/17/2024   HGB 11.3 (L) 02/17/2024   HCT 35.3 (L) 02/17/2024   MCV 91.9 02/17/2024   PLT 192 02/17/2024   Lab Results  Component Value Date   NA 137 02/17/2024   K 3.5 02/17/2024  CO2 23 02/17/2024   GLUCOSE 164 (H) 02/17/2024   BUN 9 02/17/2024   CREATININE 0.67 02/17/2024   BILITOT 0.4 07/08/2023   ALKPHOS 34 (L) 07/08/2023   AST 28 07/08/2023   ALT 20 07/08/2023   PROT 6.1 07/08/2023   ALBUMIN 3.9 07/08/2023   CALCIUM 8.7 (L) 02/17/2024   ANIONGAP 9 02/17/2024   EGFR 86 11/11/2023   Lab Results  Component Value Date   CHOL 138 01/29/2024   Lab Results  Component Value Date   HDL 53 01/29/2024   Lab Results  Component Value Date   LDLCALC 69 01/29/2024   Lab Results  Component Value Date   TRIG 82 01/29/2024   Lab Results  Component Value Date   CHOLHDL 2.6  01/29/2024   Lab Results  Component Value Date   HGBA1C 5.8 (A) 11/18/2023      Assessment & Plan:  Type 2 diabetes mellitus with hyperosmolarity without coma, without long-term current use of insulin (HCC) Assessment & Plan: Forms completed    Note: This chart has been completed using Engineer, civil (consulting) software, and while attempts have been made to ensure accuracy, certain words and phrases may not be transcribed as intended.   Follow-up: No follow-ups on file.   Gilmore Laroche, FNP

## 2024-03-02 ENCOUNTER — Telehealth: Payer: Self-pay | Admitting: Family Medicine

## 2024-03-02 NOTE — Telephone Encounter (Signed)
 Copied from CRM [id number]. Topic: General - Other >> Mar 02, 2024 11:06 AM Izetta Dakin wrote: Reason for CRM:  [id number]- Patient's daughter, Harlow Ohms, called in to request status for assisted living paperwork. She states paperwork has not been completed. Callback # [phone].

## 2024-03-04 NOTE — Telephone Encounter (Unsigned)
 Copied from CRM [id number]. Topic: General - Other >> Mar 02, 2024 11:06 AM Izetta Dakin wrote: Reason for CRM:  [id number]- Patient's daughter, Harlow Ohms, called in to request status for assisted living paperwork. She states paperwork has not been completed. Callback # [phone].

## 2024-03-04 NOTE — Telephone Encounter (Signed)
 FMLA forms were faxed to [employer] [phone]

## 2024-03-04 NOTE — Telephone Encounter (Signed)
 Faxed forms to [employer] [phone] 3x's can not get this form to go through.  Will try again, called patient and patient said she will take by the office on Thursday, 03.13.2025.

## 2024-03-05 ENCOUNTER — Telehealth: Payer: Self-pay | Admitting: Family Medicine

## 2024-03-05 NOTE — Telephone Encounter (Signed)
 Forms were sent / faxed in as of 03/04/24

## 2024-03-05 NOTE — Telephone Encounter (Signed)
 Copied from CRM [id number]. Topic: Clinical - Lab/Test Results >> Mar 05, 2024  4:15 PM DeAngela L wrote: Reason for CRM: Daughter calling to ask if Dr Denny Levy could complete the [facility] form and also put the TB test results and dietary restrictions on the form so the patient can go to the nursing at [facility] [place] the fax [phone] contact there is [place]

## 2024-03-06 ENCOUNTER — Other Ambulatory Visit: Payer: Self-pay | Admitting: Family Medicine

## 2024-03-06 DIAGNOSIS — Z111 Encounter for screening for respiratory tuberculosis: Secondary | ICD-10-CM

## 2024-03-06 NOTE — Telephone Encounter (Signed)
 Pls let her know she needs to come in and have the TB lab test done and the forms will be complete once the results are back

## 2024-03-06 NOTE — Telephone Encounter (Signed)
 [name] has them and working on them. Waiting for her TB test to be done/read

## 2024-03-06 NOTE — Telephone Encounter (Signed)
 [name] is holding the forms in her office until the TB test is done/read. She said she had completed everything else and the forms will be ready once she comes back in Wed to have the TB test read

## 2024-03-06 NOTE — Telephone Encounter (Signed)
 Patient will come Monday for the tb test

## 2024-03-06 NOTE — Telephone Encounter (Signed)
 What about the dietary restrictions, will this be done after TB results are resulted as well? Daughter asking for a call back [phone].

## 2024-03-06 NOTE — Telephone Encounter (Signed)
 Called daughter she will bring her in Monday morning

## 2024-03-09 ENCOUNTER — Ambulatory Visit

## 2024-03-09 ENCOUNTER — Telehealth: Payer: Self-pay | Admitting: Family Medicine

## 2024-03-09 DIAGNOSIS — Z111 Encounter for screening for respiratory tuberculosis: Secondary | ICD-10-CM | POA: Diagnosis not present

## 2024-03-09 NOTE — Telephone Encounter (Signed)
 Reviewed her orders and it seems she has not competed lab for the quantiferon tb [facility]

## 2024-03-09 NOTE — Telephone Encounter (Signed)
 Seen in office for lab work today. Awaiting results

## 2024-03-09 NOTE — Telephone Encounter (Signed)
FL2  Noted  Copied Sleeved  Original in PCP box Copy front desk folder

## 2024-03-11 NOTE — Telephone Encounter (Signed)
 The had it done Monday. The results are still preliminary. They will need to be faxed to [facility] asap once received

## 2024-03-12 NOTE — Telephone Encounter (Signed)
 Faxed and daughter aware

## 2024-03-13 LAB — QUANTIFERON-TB GOLD PLUS
QuantiFERON Nil Value: 0.05 [IU]/mL
QuantiFERON TB1 Ag Value: 0.05 [IU]/mL
QuantiFERON TB2 Ag Value: 0.04 [IU]/mL

## 2024-03-13 NOTE — Telephone Encounter (Signed)
 [facility] F[facility]ed to the [facility]

## 2024-03-13 NOTE — Telephone Encounter (Addendum)
 Patients daughter  is calling to ask if the nurse can nurse can change the FL-2. Needing to change the order of the Dementia can not be the number 1 reason for care. [facility] [place] can not accept if Dementia is the number one reason.

## 2024-03-13 NOTE — Telephone Encounter (Signed)
 Thank you :)

## 2024-03-17 ENCOUNTER — Encounter: Payer: Self-pay | Admitting: Nurse Practitioner

## 2024-03-17 ENCOUNTER — Ambulatory Visit: Payer: BC Managed Care – PPO | Admitting: Nurse Practitioner

## 2024-03-17 VITALS — BP 130/80 | HR 62 | Ht 67.0 in | Wt 168.4 lb

## 2024-03-17 DIAGNOSIS — Z794 Long term (current) use of insulin: Secondary | ICD-10-CM | POA: Diagnosis not present

## 2024-03-17 DIAGNOSIS — Z7984 Long term (current) use of oral hypoglycemic drugs: Secondary | ICD-10-CM

## 2024-03-17 DIAGNOSIS — E559 Vitamin D deficiency, unspecified: Secondary | ICD-10-CM

## 2024-03-17 DIAGNOSIS — E1100 Type 2 diabetes mellitus with hyperosmolarity without nonketotic hyperglycemic-hyperosmolar coma (NKHHC): Secondary | ICD-10-CM

## 2024-03-17 DIAGNOSIS — E119 Type 2 diabetes mellitus without complications: Secondary | ICD-10-CM | POA: Diagnosis not present

## 2024-03-17 DIAGNOSIS — E039 Hypothyroidism, unspecified: Secondary | ICD-10-CM

## 2024-03-17 LAB — POCT GLYCOSYLATED HEMOGLOBIN (HGB A1C): Hemoglobin A1C: 5.8 % — AB (ref 4.0–5.6)

## 2024-03-17 MED ORDER — INSULIN LISPRO (1 UNIT DIAL) 100 UNIT/ML (KWIKPEN)
2.0000 [IU] | PEN_INJECTOR | Freq: Three times a day (TID) | SUBCUTANEOUS | 3 refills | Status: DC
Start: 2024-03-17 — End: 2024-07-21

## 2024-03-17 MED ORDER — RYBELSUS 14 MG PO TABS: 14.0000 mg | ORAL_TABLET | Freq: Every day | ORAL | 3 refills | Status: DC

## 2024-03-17 MED ORDER — INSULIN GLARGINE 100 UNIT/ML SOLOSTAR PEN: 20.0000 [IU] | PEN_INJECTOR | Freq: Every day | SUBCUTANEOUS | 3 refills | Status: DC

## 2024-03-17 NOTE — Progress Notes (Signed)
 Endocrinology Follow Up Note       03/17/2024, 1:33 PM   Subjective:    Patient ID: [name], female    DOB: [birth year].  [name] is being seen in follow up after being seen in consultation for management of currently uncontrolled symptomatic diabetes requested by  Gilmore Laroche, FNP.   Past Medical History:  Diagnosis Date   Anemia    as a teenager   Anxiety    Arthritis    knees, rt hand,hips   Cancer (HCC)    vaginal,uterine,ovariam   COPD (chronic obstructive pulmonary disease) (HCC)    Depression    Diabetes mellitus without complication (HCC)    Hypothyroidism    Neuromuscular disorder (HCC)    hands and feet    Past Surgical History:  Procedure Laterality Date   ABDOMINAL HYSTERECTOMY     BIOPSY  08/06/2023   Procedure: BIOPSY;  Surgeon: [name], DO;  [facility]: [facility];  Service: Endoscopy;;   BREAST SURGERY Right 1988   lumpectomy   COLONOSCOPY WITH PROPOFOL N/A 08/06/2023   Procedure: COLONOSCOPY WITH PROPOFOL;  Surgeon: [name], DO;  [facility];  Service: Endoscopy;  Laterality: N/A;  900am, asa 2   ESOPHAGOGASTRODUODENOSCOPY (EGD) WITH PROPOFOL N/A 08/06/2023   Procedure: ESOPHAGOGASTRODUODENOSCOPY (EGD) WITH PROPOFOL;  Surgeon: [name], DO;  [facility];  Service: Endoscopy;  Laterality: N/A;   EYE SURGERY Bilateral    FRACTURE SURGERY     Rt ankle,lt knee cap,lt shoulder,rt wrist,rt shoulder,   POLYPECTOMY  08/06/2023   Procedure: POLYPECTOMY;  Surgeon: [name], DO;  [facility]: [facility];  Service: Endoscopy;;   TONSILLECTOMY     at age 73   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   Procedure: TOTAL KNEE ARTHROPLASTY;  Surgeon: Frederico Hamman, MD;  Location: [facility];  Service: Orthopedics;  Laterality: Right;    Social History   Socioeconomic History   Marital status: Married    Spouse  name: Not on file   Number of children: Not on file   Years of education: Not on file   Highest education level: Not on file  Occupational History   Not on file  Tobacco Use   Smoking status: Former    Current packs/day: 0.00    Average packs/day: 0.5 packs/day for 15.0 years (7.5 ttl pk-yrs)    Types: Cigarettes    Start date: 63    Quit date: 2013    Years since quitting: 12.2   Smokeless tobacco: Never  Vaping Use   Vaping status: Never Used  Substance and Sexual Activity   Alcohol use: Never   Drug use: Never   Sexual activity: Not on file  Other Topics Concern   Not on file  Social History Narrative   Not on file   Social Drivers of Health   Financial Resource Strain: Low Risk  (07/25/2023)   Overall Financial Resource Strain (CARDIA)    Difficulty of Paying Living Expenses: Not hard at all  Food Insecurity: No Food Insecurity (07/25/2023)   Hunger Vital Sign    Worried About Running Out of Food in the Last Year:  Never true    Ran Out of Food in the Last Year: Never true  Transportation Needs: No Transportation Needs (07/25/2023)   PRAPARE - Administrator, Civil Service (Medical): No    Lack of Transportation (Non-Medical): No  Physical Activity: Sufficiently Active (07/25/2023)   Exercise Vital Sign    Days of Exercise per Week: 7 days    Minutes of Exercise per Session: 30 min  Stress: Stress Concern Present (07/25/2023)   [employer] of Occupational Health - Occupational Stress Questionnaire    Feeling of Stress : Very much  Social Connections: Moderately Isolated (07/25/2023)   Social Connection and Isolation Panel [NHANES]    Frequency of Communication with Friends and Family: More than three times a week    Frequency of Social Gatherings with Friends and Family: More than three times a week    Attends Religious Services: Never    Database administrator or Organizations: No    Attends Engineer, structural: Never    Marital Status:  Married    Family History  Problem Relation Age of Onset   Cancer Mother    Hypertension Mother    Thyroid disease Mother    Diabetes Mother    Stroke Mother    Heart failure Mother    Diabetes Father    Heart attack Father    Diabetes Sister    Diabetes Brother     Outpatient Encounter Medications as of 03/17/2024  Medication Sig   acyclovir (ZOVIRAX) 400 MG tablet TAKE 1 TABLET BY MOUTH EVERY TWELVE HOURS (AT BREAKFAST AND AT BEDTIME)   alendronate (FOSAMAX) 70 MG tablet TAKE 1 TABLET BY MOUTH ONCE A WEEK, TAKE with full GLASS of water ON an EMPTY stomach   Apple Cider Vinegar 300 MG TABS Take by mouth.   aspirin 81 MG chewable tablet Chew 1 tablet (81 mg total) by mouth 2 (two) times daily.   atorvastatin (LIPITOR) 20 MG tablet TAKE 1 TABLET BY MOUTH AT BEDTIME   Blood Pressure Monitoring (BLOOD PRESSURE MONITOR/L CUFF) MISC Dx: R03.0, elevated bp in office   cholecalciferol (VITAMIN D3) 25 MCG (1000 UNIT) tablet Take 1,000 Units by mouth daily.   Continuous Glucose Sensor (DEXCOM G7 SENSOR) MISC USE AS DIRECTED TO test BLOOD SUGAR FOUR TIMES DAILY. CHANGE sensor EVERY 10 DAYS   donepezil (ARICEPT) 10 MG tablet TAKE 1 TABLET BY MOUTH EVERY DAY   EPINEPHrine 0.3 mg/0.3 mL IJ SOAJ injection Inject 0.3 mg into the muscle as needed for anaphylaxis.   escitalopram (LEXAPRO) 20 MG tablet TAKE 1 TABLET BY MOUTH EVERY DAY   glucose blood (TRUE METRIX BLOOD GLUCOSE TEST) test strip USE AS DIRECTED   Insulin Pen Needle (BD AUTOSHIELD DUO) 30G X 5 MM MISC USE AS DIRECTED   levothyroxine (SYNTHROID) 88 MCG tablet TAKE 1 TABLET BY MOUTH ONCE A DAY BEFORE BREAKFAST   loperamide (IMODIUM) 2 MG capsule Take 2 mg by mouth See admin instructions. Take 2 mg PO after each loose stool as needed for diarrhea (max of 3 doses in 24 hours)   methocarbamol (ROBAXIN) 500 MG tablet Take 1 tablet (500 mg total) by mouth 2 (two) times daily.   mirabegron ER (MYRBETRIQ) 25 MG TB24 tablet Take 1 tablet (25 mg  total) by mouth daily.   nystatin cream (MYCOSTATIN) apply TO SKIN folds, UNDER breasts, abdominal folds, AND inguinal folds TWICE DAILY FOR candidasis   pantoprazole (PROTONIX) 40 MG tablet Take 1 tablet (40 mg total)  by mouth 2 (two) times daily.   prazosin (MINIPRESS) 2 MG capsule TAKE ONE CAPSULE BY MOUTH AT BEDTIME   predniSONE (DELTASONE) 20 MG tablet Take 1 tablet (20 mg total) by mouth 2 (two) times daily with a meal.   Specialty Vitamins Products (COLLAGEN ULTRA PO) Take by mouth.   trospium (SANCTURA) 20 MG tablet TAKE 1 TABLET BY MOUTH TWICE DAILY   vitamin B-12 (CYANOCOBALAMIN) 1000 MCG tablet Take 1,000 mcg by mouth daily.   Vitamin D, Ergocalciferol, (DRISDOL) 1.25 MG (50000 UNIT) CAPS capsule TAKE ONE CAPSULE BY MOUTH ONCE WEEKLY ON TUESDAY   [DISCONTINUED] insulin glargine (LANTUS) 100 UNIT/ML Solostar Pen Inject 35 Units into the skin at bedtime.   [DISCONTINUED] insulin lispro (HUMALOG) 100 UNIT/ML KwikPen Inject 6-12 Units into the skin 3 (three) times daily.   [DISCONTINUED] Semaglutide (RYBELSUS) 14 MG TABS Take 1 tablet (14 mg total) by mouth daily.   insulin glargine (LANTUS) 100 UNIT/ML Solostar Pen Inject 20 Units into the skin at bedtime.   insulin lispro (HUMALOG) 100 UNIT/ML KwikPen Inject 2-8 Units into the skin 3 (three) times daily.   Semaglutide (RYBELSUS) 14 MG TABS Take 1 tablet (14 mg total) by mouth daily.   No facility-administered encounter medications on file as of 03/17/2024.    ALLERGIES: Allergies  Allergen Reactions   Ambien [Zolpidem Tartrate] Other (See Comments)    Hallucinations/nightmares   Bee Venom Swelling   Coconut Flavoring Agent (Non-Screening) Swelling    Mouth swells   Codeine Nausea And Vomiting   Onion Nausea Only and Other (See Comments)    Raw onions   Other     Ground Beef (patient avoid due to side effect of diarrhea)   Sulfa Antibiotics Rash and Other (See Comments)    Including topical sulfa  (mouth dries/peels/skin  blisters)    VACCINATION STATUS: Immunization History  Administered Date(s) Administered   Fluad Quad(high Dose 65+) 12/03/2022   Pneumococcal Polysaccharide-23 08/22/2022   Tdap 05/29/2005, 05/22/2022   Zoster Recombinant(Shingrix) 05/22/2022    Diabetes She presents for her follow-up diabetic visit. She has type 2 diabetes mellitus. Onset time: Diagnosed at approx age of 62. Her disease course has been stable. There are no hypoglycemic associated symptoms. Associated symptoms include foot paresthesias. Pertinent negatives for diabetes include no weight loss. There are no hypoglycemic complications. Symptoms are stable. There are no diabetic complications. Risk factors for coronary artery disease include diabetes mellitus, family history, obesity, sedentary lifestyle and post-menopausal. Current diabetic treatment includes intensive insulin program and oral agent (monotherapy). She is compliant with treatment most of the time. Her weight is decreasing steadily. She is following a generally healthy diet. Meal planning includes avoidance of concentrated sweets. She has not had a previous visit with a dietitian. She participates in exercise daily. Her home blood glucose trend is fluctuating minimally. Her overall blood glucose range is 130-140 mg/dl. (She presents today with her CGM showing at goal glycemic profile.  Her POCT A1c today is 5.8%, unchanged from last visit.  Analysis of her CGM shows TIR 80%, TAR 19%, TBR 1% with a GMI of 6.7%.  She will be moving to an assisted living facility here in [place] soon.) An ACE inhibitor/angiotensin II receptor blocker is being taken. She sees a podiatrist.Eye exam is current.    Review of systems  Constitutional: + stable body weight,  current Body mass index is 26.38 kg/m. , no fatigue, no subjective hyperthermia, no subjective hypothermia Eyes: no blurry vision, no xerophthalmia ENT: no  sore throat, no nodules palpated in throat, no  dysphagia/odynophagia, no hoarseness Cardiovascular: no chest pain, no shortness of breath, no palpitations, no leg swelling Respiratory: no cough, no shortness of breath Gastrointestinal: no nausea/vomiting/diarrhea Musculoskeletal: no muscle/joint aches Skin: no rashes, no hyperemia Neurological: no tremors, + numbness/ tingling to bilateral hands, no dizziness Psychiatric: no depression, no anxiety  Objective:     BP 130/80 (BP Location: Right Arm, Patient Position: Sitting, Cuff Size: Large)   Ht 5\' 7"  (1.702 m)   Wt 168 lb 6.4 oz (76.4 kg)   BMI 26.38 kg/m   Wt Readings from Last 3 Encounters:  03/17/24 168 lb 6.4 oz (76.4 kg)  02/28/24 164 lb 12.8 oz (74.8 kg)  02/13/24 166 lb (75.3 kg)     BP Readings from Last 3 Encounters:  03/17/24 130/80  02/28/24 129/60  02/18/24 (!) 165/50      Physical Exam- Limited  Constitutional:  Body mass index is 26.38 kg/m. , not in acute distress, normal state of mind Eyes:  EOMI, no exophthalmos Musculoskeletal: no gross deformities, strength intact in all four extremities, no gross restriction of joint movements Skin:  no rashes, no hyperemia Neurological: no tremor with outstretched hands   Diabetic Foot Exam - Simple   No data filed     CMP ( most recent) CMP     Component Value Date/Time   NA 137 02/17/2024 1612   NA 139 11/11/2023 1028   K 3.5 02/17/2024 1612   CL 105 02/17/2024 1612   CO2 23 02/17/2024 1612   GLUCOSE 164 (H) 02/17/2024 1612   BUN 9 02/17/2024 1612   BUN 14 11/11/2023 1028   CREATININE 0.67 02/17/2024 1612   CALCIUM 8.7 (L) 02/17/2024 1612   PROT 6.1 07/08/2023 0914   ALBUMIN 3.9 07/08/2023 0914   AST 28 07/08/2023 0914   ALT 20 07/08/2023 0914   ALKPHOS 34 (L) 07/08/2023 0914   BILITOT 0.4 07/08/2023 0914   GFRNONAA >60 02/17/2024 1612   GFRAA >60 09/12/2019 0239     Diabetic Labs (most recent): Lab Results  Component Value Date   HGBA1C 5.8 (A) 03/17/2024   HGBA1C 5.8 (A)  11/18/2023   HGBA1C 6.2 (A) 06/17/2023     Lipid Panel ( most recent) Lipid Panel     Component Value Date/Time   CHOL 138 01/29/2024 1310   TRIG 82 01/29/2024 1310   HDL 53 01/29/2024 1310   CHOLHDL 2.6 01/29/2024 1310   LDLCALC 69 01/29/2024 1310   LABVLDL 16 01/29/2024 1310      Lab Results  Component Value Date   TSH 2.890 01/29/2024   TSH 0.313 (L) 07/08/2023   TSH 0.648 02/22/2023   TSH 0.317 (L) 08/22/2022   TSH 0.403 (L) 05/22/2022   FREET4 1.15 01/29/2024   FREET4 1.43 07/08/2023   FREET4 1.25 02/22/2023   FREET4 1.46 08/22/2022   FREET4 1.52 05/22/2022           Assessment & Plan:   1) Type 2 diabetes mellitus without complication, with long-term current use of insulin (HCC)  She presents today with her CGM showing at goal glycemic profile.  Her POCT A1c today is 5.8%, unchanged from last visit.  Analysis of her CGM shows TIR 80%, TAR 19%, TBR 1% with a GMI of 6.7%.  She will be moving to an assisted living facility here in [place] soon.  - Kathryne Sharper has currently uncontrolled symptomatic type 2 DM since 73 years of age.   -  Recent labs reviewed.  - I had a long discussion with her about the progressive nature of diabetes and the pathology behind its complications. -her diabetes is not currently complicated but she remains at a high risk for more acute and chronic complications which include CAD, CVA, CKD, retinopathy, and neuropathy. These are all discussed in detail with her.  The following Lifestyle Medicine recommendations according to American College of Lifestyle Medicine [place]) were discussed and offered to patient and she agrees to start the journey:  A. Whole Foods, Plant-based plate comprising of fruits and vegetables, plant-based proteins, whole-grain carbohydrates was discussed in detail with the patient.   A list for source of those nutrients were also provided to the patient.  Patient will use only water or unsweetened tea for  hydration. B.  The need to stay away from risky substances including alcohol, smoking; obtaining 7 to 9 hours of restorative sleep, at least 150 minutes of moderate intensity exercise weekly, the importance of healthy social connections,  and stress reduction techniques were discussed. C.  A full color page of  Calorie density of various food groups per pound showing examples of each food groups was provided to the patient.  - Nutritional counseling repeated at each appointment due to patients tendency to fall back in to old habits.  - The patient admits there is a room for improvement in their diet and drink choices. -  Suggestion is made for the patient to avoid simple carbohydrates from their diet including Cakes, Sweet Desserts / Pastries, Ice Cream, Soda (diet and regular), Sweet Tea, Candies, Chips, Cookies, Sweet Pastries, Store Bought Juices, Alcohol in Excess of 1-2 drinks a day, Artificial Sweeteners, Coffee Creamer, and "Sugar-free" Products. This will help patient to have stable blood glucose profile and potentially avoid unintended weight gain.   - I encouraged the patient to switch to unprocessed or minimally processed complex starch and increased protein intake (animal or plant source), fruits, and vegetables.   - Patient is advised to stick to a routine mealtimes to eat 3 meals a day and avoid unnecessary snacks (to snack only to correct hypoglycemia).  - I have approached her with the following individualized plan to manage her diabetes and patient agrees:   -She is advised to lower her Lantus to 20 units SQ nightly and lower her Humalog to 2-8 units TID with meals if glucose is above 90 and she is eating (Specific instructions on how to titrate insulin dosage based on glucose readings given to patient in writing).  She can also continue Rybelsus 14 mg po daily before breakfast.  We discussed potential GI SE to watch for.  She did have elevated lipase during a ED trip for stomach pain  but it was mild and self-limited and not determined to be pancreatitis.  -she is encouraged to continue monitoring glucose 4 times daily (using her CGM), before meals and before bed, and to call the clinic if she has readings less than 70 or above 300 for 3 tests in a row.   - she is warned not to take insulin without proper monitoring per orders. - Adjustment parameters are given to her for hypo and hyperglycemia in writing.  - she is not a candidate for sulfa meds due to allergy.  - Specific targets for  A1c; LDL, HDL, and Triglycerides were discussed with the patient.  2) Blood Pressure /Hypertension:  her blood pressure is controlled to target without the use of antihypertensive medications.  3) Lipids/Hyperlipidemia:  Review of her recent lipid panel from 01/29/24 showed controlled LDL at 69.  she is advised to continue Lipitor 20 mg daily at bedtime.  Side effects and precautions discussed with her.  4)  Weight/Diet:  her Body mass index is 26.38 kg/m.  -  clearly complicating her diabetes care.   she is a candidate for weight loss. I discussed with her the fact that loss of 5 - 10% of her  current body weight will have the most impact on her diabetes management.  Exercise, and detailed carbohydrates information provided  -  detailed on discharge instructions.  5) Hypothyroidism-unspecified -Currently managed by her PCP.  6) Vitamin D deficiency Her recent vitamin D level on 01/29/24 was 48.9.  Her PCP put her on Ergocalciferol 50000 units weekly.  7) Chronic Care/Health Maintenance: -she is not on ACEI/ARB and is on Statin medications and is encouraged to initiate and continue to follow up with Ophthalmology, Dentist, Podiatrist at least yearly or according to recommendations, and advised to stay away from smoking. I have recommended yearly flu vaccine and pneumonia vaccine at least every 5 years; moderate intensity exercise for up to 150 minutes weekly; and sleep for at least 7  hours a day.  - she is advised to maintain close follow up with Gilmore Laroche, FNP for primary care needs, as well as her other providers for optimal and coordinated care.     I spent  38  minutes in the care of the patient today including review of labs from CMP, Lipids, Thyroid Function, Hematology (current and previous including abstractions from other facilities); face-to-face time discussing  her blood glucose readings/logs, discussing hypoglycemia and hyperglycemia episodes and symptoms, medications doses, her options of short and long term treatment based on the latest standards of care / guidelines;  discussion about incorporating lifestyle medicine;  and documenting the encounter. Risk reduction counseling performed per USPSTF guidelines to reduce obesity and cardiovascular risk factors.     Please refer to Patient Instructions for Blood Glucose Monitoring and Insulin/Medications Dosing Guide"  in media tab for additional information. Please  also refer to " Patient Self Inventory" in the Media  tab for reviewed elements of pertinent patient history.  Kathryne Sharper participated in the discussions, expressed understanding, and voiced agreement with the above plans.  All questions were answered to her satisfaction. she is encouraged to contact clinic should she have any questions or concerns prior to her return visit.     Follow up plan: - Return in about 4 months (around 07/17/2024) for Diabetes F/U with A1c in office, No previsit labs, Bring meter and logs.  Ronny Bacon, [facility] [address] [place], [place] [address] Phone: [phone] Fax: [phone]  03/17/2024, 1:33 PM

## 2024-03-20 ENCOUNTER — Telehealth: Payer: Self-pay | Admitting: Family Medicine

## 2024-03-20 DIAGNOSIS — R32 Unspecified urinary incontinence: Secondary | ICD-10-CM | POA: Insufficient documentation

## 2024-03-20 NOTE — Telephone Encounter (Signed)
 Forms have been corrected Algernon Huxley from [facility] came in personally to pick form up.

## 2024-03-20 NOTE — Telephone Encounter (Signed)
 Copied from CRM [id number]. Topic: General - Other >> Mar 20, 2024  9:51 AM Marland Kitchen D wrote: Patient's daughter needs her mother's FL2 form to be filled out correctly she states her mom is at risk of being homeless if this isn't done ASAP she is to be moved by April but can't until forms are done correctly and sent over to [employer] [facility] [employer].

## 2024-03-24 NOTE — Telephone Encounter (Signed)
 Patient daughter pick up form [name] could not finish filling out needs the endo provider to sign due to the medicine not prescribed by [name].

## 2024-03-30 DIAGNOSIS — K219 Gastro-esophageal reflux disease without esophagitis: Secondary | ICD-10-CM | POA: Diagnosis not present

## 2024-03-30 DIAGNOSIS — E039 Hypothyroidism, unspecified: Secondary | ICD-10-CM | POA: Diagnosis not present

## 2024-03-30 DIAGNOSIS — F0390 Unspecified dementia without behavioral disturbance: Secondary | ICD-10-CM | POA: Diagnosis not present

## 2024-03-30 DIAGNOSIS — J309 Allergic rhinitis, unspecified: Secondary | ICD-10-CM | POA: Diagnosis not present

## 2024-03-30 DIAGNOSIS — Z8543 Personal history of malignant neoplasm of ovary: Secondary | ICD-10-CM | POA: Diagnosis not present

## 2024-03-30 DIAGNOSIS — E119 Type 2 diabetes mellitus without complications: Secondary | ICD-10-CM | POA: Diagnosis not present

## 2024-03-30 DIAGNOSIS — F419 Anxiety disorder, unspecified: Secondary | ICD-10-CM | POA: Diagnosis not present

## 2024-03-30 DIAGNOSIS — E782 Mixed hyperlipidemia: Secondary | ICD-10-CM | POA: Diagnosis not present

## 2024-03-30 DIAGNOSIS — M1990 Unspecified osteoarthritis, unspecified site: Secondary | ICD-10-CM | POA: Diagnosis not present

## 2024-04-01 ENCOUNTER — Encounter: Payer: Self-pay | Admitting: Internal Medicine

## 2024-04-01 DIAGNOSIS — N390 Urinary tract infection, site not specified: Secondary | ICD-10-CM | POA: Diagnosis not present

## 2024-04-06 DIAGNOSIS — N390 Urinary tract infection, site not specified: Secondary | ICD-10-CM | POA: Diagnosis not present

## 2024-04-06 DIAGNOSIS — K219 Gastro-esophageal reflux disease without esophagitis: Secondary | ICD-10-CM | POA: Diagnosis not present

## 2024-04-06 DIAGNOSIS — R197 Diarrhea, unspecified: Secondary | ICD-10-CM | POA: Diagnosis not present

## 2024-04-10 DIAGNOSIS — E559 Vitamin D deficiency, unspecified: Secondary | ICD-10-CM | POA: Diagnosis not present

## 2024-04-10 DIAGNOSIS — E039 Hypothyroidism, unspecified: Secondary | ICD-10-CM | POA: Diagnosis not present

## 2024-04-10 DIAGNOSIS — E119 Type 2 diabetes mellitus without complications: Secondary | ICD-10-CM | POA: Diagnosis not present

## 2024-04-10 DIAGNOSIS — E782 Mixed hyperlipidemia: Secondary | ICD-10-CM | POA: Diagnosis not present

## 2024-04-10 DIAGNOSIS — I10 Essential (primary) hypertension: Secondary | ICD-10-CM | POA: Diagnosis not present

## 2024-04-13 DIAGNOSIS — F419 Anxiety disorder, unspecified: Secondary | ICD-10-CM | POA: Diagnosis not present

## 2024-04-13 DIAGNOSIS — F331 Major depressive disorder, recurrent, moderate: Secondary | ICD-10-CM | POA: Diagnosis not present

## 2024-04-13 DIAGNOSIS — F515 Nightmare disorder: Secondary | ICD-10-CM | POA: Diagnosis not present

## 2024-04-13 DIAGNOSIS — F0390 Unspecified dementia without behavioral disturbance: Secondary | ICD-10-CM | POA: Diagnosis not present

## 2024-04-21 DIAGNOSIS — R32 Unspecified urinary incontinence: Secondary | ICD-10-CM | POA: Diagnosis not present

## 2024-04-21 NOTE — Telephone Encounter (Signed)
 error

## 2024-04-27 DIAGNOSIS — E039 Hypothyroidism, unspecified: Secondary | ICD-10-CM | POA: Diagnosis not present

## 2024-04-27 DIAGNOSIS — K219 Gastro-esophageal reflux disease without esophagitis: Secondary | ICD-10-CM | POA: Diagnosis not present

## 2024-04-27 DIAGNOSIS — E119 Type 2 diabetes mellitus without complications: Secondary | ICD-10-CM | POA: Diagnosis not present

## 2024-04-27 DIAGNOSIS — N390 Urinary tract infection, site not specified: Secondary | ICD-10-CM | POA: Diagnosis not present

## 2024-04-28 ENCOUNTER — Ambulatory Visit: Payer: Medicare PPO | Admitting: Family Medicine

## 2024-04-30 ENCOUNTER — Ambulatory Visit: Payer: Medicare PPO | Admitting: Family Medicine

## 2024-05-01 ENCOUNTER — Ambulatory Visit: Payer: Medicare PPO | Admitting: Family Medicine

## 2024-05-04 DIAGNOSIS — F515 Nightmare disorder: Secondary | ICD-10-CM | POA: Diagnosis not present

## 2024-05-04 DIAGNOSIS — F331 Major depressive disorder, recurrent, moderate: Secondary | ICD-10-CM | POA: Diagnosis not present

## 2024-05-04 DIAGNOSIS — K219 Gastro-esophageal reflux disease without esophagitis: Secondary | ICD-10-CM | POA: Diagnosis not present

## 2024-05-04 DIAGNOSIS — F411 Generalized anxiety disorder: Secondary | ICD-10-CM | POA: Diagnosis not present

## 2024-05-04 DIAGNOSIS — N390 Urinary tract infection, site not specified: Secondary | ICD-10-CM | POA: Diagnosis not present

## 2024-05-04 DIAGNOSIS — F0390 Unspecified dementia without behavioral disturbance: Secondary | ICD-10-CM | POA: Diagnosis not present

## 2024-05-04 DIAGNOSIS — R197 Diarrhea, unspecified: Secondary | ICD-10-CM | POA: Diagnosis not present

## 2024-05-15 DIAGNOSIS — F0390 Unspecified dementia without behavioral disturbance: Secondary | ICD-10-CM | POA: Diagnosis not present

## 2024-05-15 DIAGNOSIS — M1990 Unspecified osteoarthritis, unspecified site: Secondary | ICD-10-CM | POA: Diagnosis not present

## 2024-05-15 DIAGNOSIS — E039 Hypothyroidism, unspecified: Secondary | ICD-10-CM | POA: Diagnosis not present

## 2024-05-15 DIAGNOSIS — E119 Type 2 diabetes mellitus without complications: Secondary | ICD-10-CM | POA: Diagnosis not present

## 2024-05-15 DIAGNOSIS — E782 Mixed hyperlipidemia: Secondary | ICD-10-CM | POA: Diagnosis not present

## 2024-05-15 DIAGNOSIS — K219 Gastro-esophageal reflux disease without esophagitis: Secondary | ICD-10-CM | POA: Diagnosis not present

## 2024-05-18 DIAGNOSIS — R197 Diarrhea, unspecified: Secondary | ICD-10-CM | POA: Diagnosis not present

## 2024-05-18 DIAGNOSIS — L659 Nonscarring hair loss, unspecified: Secondary | ICD-10-CM | POA: Diagnosis not present

## 2024-05-20 DIAGNOSIS — M545 Low back pain, unspecified: Secondary | ICD-10-CM | POA: Diagnosis not present

## 2024-05-20 DIAGNOSIS — N390 Urinary tract infection, site not specified: Secondary | ICD-10-CM | POA: Diagnosis not present

## 2024-05-25 DIAGNOSIS — N390 Urinary tract infection, site not specified: Secondary | ICD-10-CM | POA: Diagnosis not present

## 2024-06-01 DIAGNOSIS — M810 Age-related osteoporosis without current pathological fracture: Secondary | ICD-10-CM | POA: Diagnosis not present

## 2024-06-01 DIAGNOSIS — E782 Mixed hyperlipidemia: Secondary | ICD-10-CM | POA: Diagnosis not present

## 2024-06-01 DIAGNOSIS — R197 Diarrhea, unspecified: Secondary | ICD-10-CM | POA: Diagnosis not present

## 2024-06-09 DIAGNOSIS — F0390 Unspecified dementia without behavioral disturbance: Secondary | ICD-10-CM | POA: Diagnosis not present

## 2024-06-09 DIAGNOSIS — F331 Major depressive disorder, recurrent, moderate: Secondary | ICD-10-CM | POA: Diagnosis not present

## 2024-06-09 DIAGNOSIS — F515 Nightmare disorder: Secondary | ICD-10-CM | POA: Diagnosis not present

## 2024-06-09 DIAGNOSIS — F411 Generalized anxiety disorder: Secondary | ICD-10-CM | POA: Diagnosis not present

## 2024-06-15 NOTE — Progress Notes (Signed)
   06/15/2024  Patient ID: [name], female   DOB: 1951-04-22, 73 y.o.   MRN: [id number]  Pharmacy Quality Measure Review  This patient is appearing on a report for being at risk of failing the adherence measure for cholesterol (statin) medications this calendar year.   Medication: atorvastatin  20mg . fill hx not up to date in report, last filled 04/13/24 and 05/11/24 for 28 DSs. PDC 98%.   Lang Sieve, PharmD, BCGP Clinical Pharmacist  [phone]

## 2024-06-20 ENCOUNTER — Emergency Department (HOSPITAL_COMMUNITY)

## 2024-06-20 ENCOUNTER — Other Ambulatory Visit: Payer: Self-pay

## 2024-06-20 ENCOUNTER — Emergency Department (HOSPITAL_COMMUNITY)
Admission: EM | Admit: 2024-06-20 | Discharge: 2024-06-20 | Disposition: A | Discharge status: Skilled Nursing Facility | Attending: Emergency Medicine | Admitting: Emergency Medicine

## 2024-06-20 DIAGNOSIS — I10 Essential (primary) hypertension: Secondary | ICD-10-CM | POA: Diagnosis not present

## 2024-06-20 DIAGNOSIS — E860 Dehydration: Secondary | ICD-10-CM | POA: Diagnosis not present

## 2024-06-20 DIAGNOSIS — Z7982 Long term (current) use of aspirin: Secondary | ICD-10-CM | POA: Diagnosis not present

## 2024-06-20 DIAGNOSIS — R404 Transient alteration of awareness: Secondary | ICD-10-CM | POA: Diagnosis not present

## 2024-06-20 DIAGNOSIS — R531 Weakness: Secondary | ICD-10-CM | POA: Diagnosis not present

## 2024-06-20 DIAGNOSIS — N3000 Acute cystitis without hematuria: Secondary | ICD-10-CM | POA: Diagnosis not present

## 2024-06-20 DIAGNOSIS — R001 Bradycardia, unspecified: Secondary | ICD-10-CM | POA: Insufficient documentation

## 2024-06-20 DIAGNOSIS — Z794 Long term (current) use of insulin: Secondary | ICD-10-CM | POA: Insufficient documentation

## 2024-06-20 DIAGNOSIS — R55 Syncope and collapse: Secondary | ICD-10-CM | POA: Diagnosis not present

## 2024-06-20 DIAGNOSIS — R6889 Other general symptoms and signs: Secondary | ICD-10-CM | POA: Diagnosis not present

## 2024-06-20 DIAGNOSIS — I700 Atherosclerosis of aorta: Secondary | ICD-10-CM | POA: Diagnosis not present

## 2024-06-20 LAB — COMPREHENSIVE METABOLIC PANEL WITH GFR
ALT: 16 U/L (ref 0–44)
AST: 21 U/L (ref 15–41)
Albumin: 3.4 g/dL — ABNORMAL LOW (ref 3.5–5.0)
Alkaline Phosphatase: 32 U/L — ABNORMAL LOW (ref 38–126)
Anion gap: 10 (ref 5–15)
BUN: 11 mg/dL (ref 8–23)
CO2: 26 mmol/L (ref 22–32)
Calcium: 8.8 mg/dL — ABNORMAL LOW (ref 8.9–10.3)
Chloride: 103 mmol/L (ref 98–111)
Creatinine, Ser: 0.62 mg/dL (ref 0.44–1.00)
GFR, Estimated: >60 mL/min
Glucose, Bld: 134 mg/dL — ABNORMAL HIGH (ref 70–99)
Potassium: 3.6 mmol/L (ref 3.5–5.1)
Sodium: 139 mmol/L (ref 135–145)
Total Bilirubin: 0.7 mg/dL (ref 0.0–1.2)
Total Protein: 6.7 g/dL (ref 6.5–8.1)

## 2024-06-20 LAB — URINALYSIS, ROUTINE W REFLEX MICROSCOPIC
Bilirubin Urine: NEGATIVE
Glucose, UA: NEGATIVE mg/dL
Hgb urine dipstick: NEGATIVE
Ketones, ur: NEGATIVE mg/dL
Nitrite: NEGATIVE
Protein, ur: NEGATIVE mg/dL
Specific Gravity, Urine: 1.013 (ref 1.005–1.030)
WBC, UA: >50 WBC/hpf (ref 0–5)
pH: 7 (ref 5.0–8.0)

## 2024-06-20 LAB — CBC WITH DIFFERENTIAL/PLATELET
Abs Immature Granulocytes: 0.01 10*3/uL (ref 0.00–0.07)
Basophils Absolute: 0 10*3/uL (ref 0.0–0.1)
Basophils Relative: 1 %
Eosinophils Absolute: 0 10*3/uL (ref 0.0–0.5)
Eosinophils Relative: 1 %
HCT: 37.4 % (ref 36.0–46.0)
Hemoglobin: 12.2 g/dL (ref 12.0–15.0)
Immature Granulocytes: 0 %
Lymphocytes Relative: 20 %
Lymphs Abs: 0.9 10*3/uL (ref 0.7–4.0)
MCH: 30 pg (ref 26.0–34.0)
MCHC: 32.6 g/dL (ref 30.0–36.0)
MCV: 92.1 fL (ref 80.0–100.0)
Monocytes Absolute: 0.5 10*3/uL (ref 0.1–1.0)
Monocytes Relative: 11 %
Neutro Abs: 3 10*3/uL (ref 1.7–7.7)
Neutrophils Relative %: 67 %
Platelets: 157 10*3/uL (ref 150–400)
RBC: 4.06 MIL/uL (ref 3.87–5.11)
RDW: 14 % (ref 11.5–15.5)
WBC: 4.4 10*3/uL (ref 4.0–10.5)
nRBC: 0 % (ref 0.0–0.2)

## 2024-06-20 LAB — TROPONIN I (HIGH SENSITIVITY)
Troponin I (High Sensitivity): 3 ng/L
Troponin I (High Sensitivity): <2 ng/L (ref <18)

## 2024-06-20 MED ORDER — CEPHALEXIN 500 MG PO CAPS: 500.0000 mg | ORAL_CAPSULE | Freq: Four times a day (QID) | ORAL | 0 refills | Status: DC

## 2024-06-20 MED ORDER — SODIUM CHLORIDE 0.9 % IV BOLUS
500.0000 mL | Freq: Once | INTRAVENOUS | Status: AC
  Administered 2024-06-20: 500 mL via INTRAVENOUS

## 2024-06-20 MED ORDER — SODIUM CHLORIDE 0.9 % IV SOLN
2.0000 g | Freq: Once | INTRAVENOUS | Status: AC
  Administered 2024-06-20: 2 g via INTRAVENOUS
  Filled 2024-06-20: qty 20

## 2024-06-20 NOTE — ED Triage Notes (Signed)
 Pt arrived [facility] from [facility] in [place]. Staff called EMS for unconscious pt. Upon arrival pt was in the room by herself alert and awake on the bed stating she has been dizzy all morning.

## 2024-06-20 NOTE — ED Notes (Addendum)
 Pt has been placed on the convo list to go back to [facility]

## 2024-06-20 NOTE — ED Notes (Signed)
 Attempted to collect UA sample but patient was not ready

## 2024-06-20 NOTE — ED Notes (Signed)
 Changed linen on bed.

## 2024-06-20 NOTE — ED Notes (Signed)
 I called the facility to see why they sent patient and they stated because patient could not stand up and her blood pressure was 171/54. That was all they could say.

## 2024-06-20 NOTE — Discharge Instructions (Addendum)
 Drink plenty of fluids.  Follow-up with your family doctor within a week to make sure you are urine infection is getting better.  Follow-up with cardiology in the next couple weeks

## 2024-06-20 NOTE — ED Notes (Addendum)
 Called dietary for lunch tray and they stated they would bring something up

## 2024-06-20 NOTE — ED Notes (Signed)
 Patient is eating dinner, and she was just taken to the restroom via wheelchair.

## 2024-06-20 NOTE — ED Notes (Signed)
 Attempted to call daughter Keven per patients request and could not leave message.

## 2024-06-22 ENCOUNTER — Emergency Department (HOSPITAL_COMMUNITY)
Admission: EM | Admit: 2024-06-22 | Discharge: 2024-06-22 | Disposition: A | Discharge status: Home / Self Care | Source: Other Acute Inpatient Hospital | Attending: Emergency Medicine | Admitting: Emergency Medicine

## 2024-06-22 ENCOUNTER — Emergency Department (HOSPITAL_COMMUNITY)

## 2024-06-22 ENCOUNTER — Encounter (HOSPITAL_COMMUNITY): Payer: Self-pay

## 2024-06-22 ENCOUNTER — Other Ambulatory Visit: Payer: Self-pay

## 2024-06-22 DIAGNOSIS — R9089 Other abnormal findings on diagnostic imaging of central nervous system: Secondary | ICD-10-CM | POA: Diagnosis not present

## 2024-06-22 DIAGNOSIS — R161 Splenomegaly, not elsewhere classified: Secondary | ICD-10-CM | POA: Diagnosis not present

## 2024-06-22 DIAGNOSIS — J449 Chronic obstructive pulmonary disease, unspecified: Secondary | ICD-10-CM | POA: Insufficient documentation

## 2024-06-22 DIAGNOSIS — R109 Unspecified abdominal pain: Secondary | ICD-10-CM | POA: Insufficient documentation

## 2024-06-22 DIAGNOSIS — Z794 Long term (current) use of insulin: Secondary | ICD-10-CM | POA: Diagnosis not present

## 2024-06-22 DIAGNOSIS — R112 Nausea with vomiting, unspecified: Secondary | ICD-10-CM | POA: Diagnosis not present

## 2024-06-22 DIAGNOSIS — R42 Dizziness and giddiness: Secondary | ICD-10-CM | POA: Diagnosis not present

## 2024-06-22 DIAGNOSIS — Z743 Need for continuous supervision: Secondary | ICD-10-CM | POA: Diagnosis not present

## 2024-06-22 DIAGNOSIS — M810 Age-related osteoporosis without current pathological fracture: Secondary | ICD-10-CM | POA: Diagnosis not present

## 2024-06-22 DIAGNOSIS — R110 Nausea: Secondary | ICD-10-CM | POA: Diagnosis not present

## 2024-06-22 DIAGNOSIS — R6889 Other general symptoms and signs: Secondary | ICD-10-CM | POA: Diagnosis not present

## 2024-06-22 DIAGNOSIS — Z7982 Long term (current) use of aspirin: Secondary | ICD-10-CM | POA: Diagnosis not present

## 2024-06-22 DIAGNOSIS — E039 Hypothyroidism, unspecified: Secondary | ICD-10-CM | POA: Diagnosis not present

## 2024-06-22 DIAGNOSIS — E119 Type 2 diabetes mellitus without complications: Secondary | ICD-10-CM | POA: Diagnosis not present

## 2024-06-22 DIAGNOSIS — R519 Headache, unspecified: Secondary | ICD-10-CM | POA: Diagnosis not present

## 2024-06-22 DIAGNOSIS — K5730 Diverticulosis of large intestine without perforation or abscess without bleeding: Secondary | ICD-10-CM | POA: Diagnosis not present

## 2024-06-22 DIAGNOSIS — R55 Syncope and collapse: Secondary | ICD-10-CM | POA: Diagnosis not present

## 2024-06-22 DIAGNOSIS — F0390 Unspecified dementia without behavioral disturbance: Secondary | ICD-10-CM | POA: Insufficient documentation

## 2024-06-22 DIAGNOSIS — R5381 Other malaise: Secondary | ICD-10-CM | POA: Diagnosis not present

## 2024-06-22 LAB — COMPREHENSIVE METABOLIC PANEL WITH GFR
ALT: 15 U/L (ref 0–44)
AST: 22 U/L (ref 15–41)
Albumin: 3.3 g/dL — ABNORMAL LOW (ref 3.5–5.0)
Alkaline Phosphatase: 27 U/L — ABNORMAL LOW (ref 38–126)
Anion gap: 9 (ref 5–15)
BUN: 11 mg/dL (ref 8–23)
CO2: 25 mmol/L (ref 22–32)
Calcium: 8.6 mg/dL — ABNORMAL LOW (ref 8.9–10.3)
Chloride: 104 mmol/L (ref 98–111)
Creatinine, Ser: 0.63 mg/dL (ref 0.44–1.00)
GFR, Estimated: >60 mL/min (ref >60)
Glucose, Bld: 108 mg/dL — ABNORMAL HIGH (ref 70–99)
Potassium: 3.8 mmol/L (ref 3.5–5.1)
Sodium: 138 mmol/L (ref 135–145)
Total Bilirubin: 0.6 mg/dL (ref 0.0–1.2)
Total Protein: 6.6 g/dL (ref 6.5–8.1)

## 2024-06-22 LAB — CBC
HCT: 36 % (ref 36.0–46.0)
Hemoglobin: 11.4 g/dL — ABNORMAL LOW (ref 12.0–15.0)
MCH: 29 pg (ref 26.0–34.0)
MCHC: 31.7 g/dL (ref 30.0–36.0)
MCV: 91.6 fL (ref 80.0–100.0)
Platelets: 163 10*3/uL (ref 150–400)
RBC: 3.93 MIL/uL (ref 3.87–5.11)
RDW: 14.1 % (ref 11.5–15.5)
WBC: 4.7 10*3/uL (ref 4.0–10.5)
nRBC: 0 % (ref 0.0–0.2)

## 2024-06-22 LAB — URINALYSIS, ROUTINE W REFLEX MICROSCOPIC
Bilirubin Urine: NEGATIVE
Glucose, UA: NEGATIVE mg/dL
Hgb urine dipstick: NEGATIVE
Ketones, ur: NEGATIVE mg/dL
Leukocytes,Ua: NEGATIVE
Nitrite: NEGATIVE
Protein, ur: NEGATIVE mg/dL
Specific Gravity, Urine: 1.012 (ref 1.005–1.030)
pH: 6 (ref 5.0–8.0)

## 2024-06-22 LAB — LIPASE, BLOOD: Lipase: 32 U/L (ref 11–51)

## 2024-06-22 LAB — TROPONIN I (HIGH SENSITIVITY): Troponin I (High Sensitivity): <2 ng/L (ref <18)

## 2024-06-22 MED ORDER — MECLIZINE HCL 12.5 MG PO TABS: 12.5000 mg | ORAL_TABLET | Freq: Three times a day (TID) | ORAL | 0 refills | Status: DC | PRN

## 2024-06-22 MED ORDER — MECLIZINE HCL 12.5 MG PO TABS
25.0000 mg | ORAL_TABLET | Freq: Once | ORAL | Status: AC
  Administered 2024-06-22: 25 mg via ORAL
  Filled 2024-06-22: qty 2

## 2024-06-22 MED ORDER — ONDANSETRON 4 MG PO TBDP: 4.0000 mg | ORAL_TABLET | Freq: Three times a day (TID) | ORAL | 0 refills | Status: AC | PRN

## 2024-06-22 MED ORDER — IOHEXOL 300 MG/ML  SOLN
100.0000 mL | Freq: Once | INTRAMUSCULAR | Status: AC | PRN
Start: 2024-06-22 — End: 2024-06-22
  Administered 2024-06-22: 100 mL via INTRAVENOUS

## 2024-06-22 MED ORDER — NITROGLYCERIN 2 % TD OINT
1.0000 [in_us] | TOPICAL_OINTMENT | Freq: Once | TRANSDERMAL | Status: AC
  Administered 2024-06-22: 1 [in_us] via TOPICAL
  Filled 2024-06-22: qty 1

## 2024-06-22 MED ORDER — LACTATED RINGERS IV BOLUS
1000.0000 mL | Freq: Once | INTRAVENOUS | Status: AC
  Administered 2024-06-22: 1000 mL via INTRAVENOUS

## 2024-06-22 MED ORDER — ONDANSETRON HCL 4 MG/2ML IJ SOLN
4.0000 mg | Freq: Once | INTRAMUSCULAR | Status: DC | PRN
Start: 2024-06-22 — End: 2024-06-22

## 2024-06-22 NOTE — Discharge Instructions (Addendum)
 Your test results today were reassuring.  Continue the antibiotics for UTI.  Your cultures from 2 days ago did grow bacteria.  Antibiotic sensitivities are not yet available but your urine today does look improved from 2 days ago.    A prescription for medication called meclizine was sent to your pharmacy.  Take this as needed for dizziness and nausea.  A separate medication called ondansetron  was sent to your pharmacy as well.  This is to be taken as needed for nausea only.  Drink plenty fluids to stay hydrated.  Return to the emergency department for any new or worsening symptoms of concern.

## 2024-06-22 NOTE — ED Provider Notes (Signed)
 [facility] [facility] AT [facility] Provider Note   CSN: [id number] Arrival date & time: 06/22/24  1102     Patient presents with: Nausea and Emesis   [name] is a 73 y.o. female.    Emesis Associated symptoms: abdominal pain and diarrhea   Patient presents for nausea, vomiting, diarrhea, dizziness.  Medical history includes DM, anxiety, dementia, HLD, COPD, arthritis.  For the past 3 days, she has had nausea, vomiting, diarrhea.  She was seen in the ED 2 days ago for syncope.  She was diagnosed with UTI and prescribed Keflex.  Per chart review, culture grew greater than 100,000 colonies of gram-negative rods.  Species and sensitivities not yet available.  She was able to eat last night.  She has not been able to eat today due to the nausea.  She was unable to walk due to the dizziness.  She describes dizziness as positional.  She has also had light sensitivity today.  Currently, at rest in a dark room, she is not nauseous or dizzy.     Prior to Admission medications   Medication Sig Start Date End Date Taking? Authorizing Provider  meclizine (ANTIVERT) 12.5 MG tablet Take 1 tablet (12.5 mg total) by mouth 3 (three) times daily as needed for dizziness. 06/22/24  Yes Melvenia Motto, MD  ondansetron  (ZOFRAN -ODT) 4 MG disintegrating tablet Take 1 tablet (4 mg total) by mouth every 8 (eight) hours as needed. 06/22/24  Yes Melvenia Motto, MD  acyclovir  (ZOVIRAX ) 400 MG tablet TAKE 1 TABLET BY MOUTH EVERY TWELVE HOURS (AT BREAKFAST AND AT BEDTIME) 02/03/24   Zarwolo, Gloria, FNP  alendronate  (FOSAMAX ) 70 MG tablet TAKE 1 TABLET BY MOUTH ONCE A WEEK, TAKE with full GLASS of water  ON an EMPTY stomach 10/04/23   Zarwolo, Gloria, FNP  Apple Cider Vinegar 300 MG TABS Take by mouth.    [provider]  aspirin  81 MG chewable tablet Chew 1 tablet (81 mg total) by mouth 2 (two) times daily. 09/14/19   Chadwell, Joshua, PA-C  atorvastatin  (LIPITOR) 20 MG tablet TAKE 1  TABLET BY MOUTH AT BEDTIME 11/04/23   Zarwolo, Gloria, FNP  Blood Pressure Monitoring (BLOOD PRESSURE MONITOR/L CUFF) MISC Dx: R03.0, elevated bp in office 08/01/22   Zarwolo, Gloria, FNP  cephALEXin (KEFLEX) 500 MG capsule Take 1 capsule (500 mg total) by mouth 4 (four) times daily. 06/20/24   Zammit, Joseph, MD  cholecalciferol (VITAMIN D3) 25 MCG (1000 UNIT) tablet Take 1,000 Units by mouth daily.    [provider]  CLARITIN 10 MG tablet Take 10 mg by mouth daily. 03/30/24   [provider]  Continuous Glucose Sensor (DEXCOM G7 SENSOR) MISC USE AS DIRECTED TO test BLOOD SUGAR FOUR TIMES DAILY. CHANGE sensor EVERY 10 DAYS 01/07/24   Zarwolo, Gloria, FNP  donepezil  (ARICEPT ) 10 MG tablet TAKE 1 TABLET BY MOUTH EVERY DAY 11/04/23   Zarwolo, Gloria, FNP  EPINEPHrine  0.3 mg/0.3 mL IJ SOAJ injection Inject 0.3 mg into the muscle as needed for anaphylaxis. 05/24/22   Zarwolo, Gloria, FNP  escitalopram  (LEXAPRO ) 20 MG tablet TAKE 1 TABLET BY MOUTH EVERY DAY 02/03/24   Del Orbe Polanco, Hilario, FNP  esomeprazole (NEXIUM) 40 MG capsule Take 40 mg by mouth 2 (two) times daily. 05/25/24   [provider]  famotidine (PEPCID) 40 MG tablet Take 40 mg by mouth daily. 05/11/24   [provider]  fosfomycin (MONUROL) 3 g PACK Take by mouth. 05/26/24   [provider]  glucose blood (TRUE METRIX BLOOD GLUCOSE TEST) test strip USE AS DIRECTED 08/08/22   Zarwolo, Gloria, FNP  insulin  glargine (LANTUS ) 100 UNIT/ML Solostar Pen Inject 20 Units into the skin at bedtime. 03/17/24   Therisa Benton PARAS, NP  insulin  lispro (HUMALOG ) 100 UNIT/ML KwikPen Inject 2-8 Units into the skin 3 (three) times daily. 03/17/24   Therisa Benton PARAS, NP  Insulin  Pen Needle (BD AUTOSHIELD DUO) 30G X 5 MM MISC USE AS DIRECTED 07/18/23   Zarwolo, Gloria, FNP  levothyroxine  (SYNTHROID ) 88 MCG tablet TAKE 1 TABLET BY MOUTH ONCE A DAY BEFORE BREAKFAST 01/02/24   Zarwolo, Gloria, FNP  loperamide (IMODIUM) 2 MG capsule  Take 2 mg by mouth See admin instructions. Take 2 mg PO after each loose stool as needed for diarrhea (max of 3 doses in 24 hours) 09/29/21   [provider]  methocarbamol  (ROBAXIN ) 500 MG tablet Take 1 tablet (500 mg total) by mouth 2 (two) times daily. 08/06/22   Zarwolo, Gloria, FNP  mirabegron  ER (MYRBETRIQ ) 25 MG TB24 tablet Take 1 tablet (25 mg total) by mouth daily. 02/13/24   Watt Rush, MD  Multiple Vitamins-Minerals (HAIR SKIN & NAILS ADVANCED) TABS Take 1 tablet by mouth daily. 05/18/24   [provider]  nystatin  cream (MYCOSTATIN ) apply TO SKIN folds, UNDER breasts, abdominal folds, AND inguinal folds TWICE DAILY FOR candidasis 07/18/23   Zarwolo, Gloria, FNP  pantoprazole  (PROTONIX ) 40 MG tablet Take 1 tablet (40 mg total) by mouth 2 (two) times daily. 10/01/23   Zarwolo, Gloria, FNP  prazosin  (MINIPRESS ) 2 MG capsule TAKE ONE CAPSULE BY MOUTH AT BEDTIME 11/04/23   Zarwolo, Gloria, FNP  predniSONE  (DELTASONE ) 20 MG tablet Take 1 tablet (20 mg total) by mouth 2 (two) times daily with a meal. 07/11/22   Antonetta Rollene BRAVO, MD  Semaglutide  (RYBELSUS ) 14 MG TABS Take 1 tablet (14 mg total) by mouth daily. 03/17/24   Therisa Benton PARAS, NP  Specialty Vitamins Products (COLLAGEN ULTRA PO) Take by mouth.    [provider]  sucralfate (CARAFATE) 1 g tablet Take 1 g by mouth 4 (four) times daily. 05/11/24   [provider]  trospium  (SANCTURA ) 20 MG tablet TAKE 1 TABLET BY MOUTH TWICE DAILY 12/08/23   Watt Rush, MD  vitamin B-12 (CYANOCOBALAMIN ) 1000 MCG tablet Take 1,000 mcg by mouth daily.    [provider]  Vitamin D , Ergocalciferol , (DRISDOL ) 1.25 MG (50000 UNIT) CAPS capsule TAKE ONE CAPSULE BY MOUTH ONCE WEEKLY ON TUESDAY 02/04/24   Zarwolo, Gloria, FNP    Allergies: Coconut oil, Ambien [zolpidem tartrate], Atorvastatin , Codeine, Onion, Other, Rosuvastatin , Bee venom, and Sulfa antibiotics    Review of Systems  Eyes:  Positive for photophobia.   Gastrointestinal:  Positive for abdominal pain, diarrhea, nausea and vomiting.  Neurological:  Positive for dizziness.    Updated Vital Signs BP (!) 143/49   Pulse (!) 51   Temp 97.9 F (36.6 C)   Resp 18   Ht 5' 7 (1.702 m)   Wt 78.1 kg   SpO2 96%   BMI 26.97 kg/m   Physical Exam Vitals and nursing note reviewed.  Constitutional:      General: She is not in acute distress.    Appearance: Normal appearance. She is well-developed. She is not ill-appearing, toxic-appearing or diaphoretic.  HENT:     Head: Normocephalic and atraumatic.     Right Ear: External ear normal.     Left Ear: External ear normal.  Nose: Nose normal.     Mouth/Throat:     Mouth: Mucous membranes are moist.     Pharynx: No oropharyngeal exudate or posterior oropharyngeal erythema.   Eyes:     Conjunctiva/sclera: Conjunctivae normal.     Pupils: Pupils are equal, round, and reactive to light.    Cardiovascular:     Rate and Rhythm: Normal rate and regular rhythm.     Heart sounds: No murmur heard. Pulmonary:     Effort: Pulmonary effort is normal. No respiratory distress.     Breath sounds: Normal breath sounds. No wheezing or rales.  Chest:     Chest wall: No tenderness.  Abdominal:     General: There is no distension.     Palpations: Abdomen is soft.     Tenderness: There is abdominal tenderness.   Musculoskeletal:        General: No swelling. Normal range of motion.     Cervical back: Normal range of motion and neck supple.   Skin:    General: Skin is warm and dry.     Coloration: Skin is not jaundiced or pale.   Neurological:     General: No focal deficit present.     Mental Status: She is alert and oriented to person, place, and time.     Cranial Nerves: No cranial nerve deficit.     Sensory: No sensory deficit.     Motor: No weakness.     Coordination: Coordination normal.   Psychiatric:        Mood and Affect: Mood normal.        Behavior: Behavior normal.     (all  labs ordered are listed, but only abnormal results are displayed) Labs Reviewed  COMPREHENSIVE METABOLIC PANEL WITH GFR - Abnormal; Notable for the following components:      Result Value   Glucose, Bld 108 (*)    Calcium  8.6 (*)    Albumin 3.3 (*)    Alkaline Phosphatase 27 (*)    All other components within normal limits  CBC - Abnormal; Notable for the following components:   Hemoglobin 11.4 (*)    All other components within normal limits  C DIFFICILE QUICK SCREEN W PCR REFLEX    GASTROINTESTINAL PANEL BY PCR, STOOL (REPLACES STOOL CULTURE)  LIPASE, BLOOD  URINALYSIS, ROUTINE W REFLEX MICROSCOPIC  TROPONIN I (HIGH SENSITIVITY)  TROPONIN I (HIGH SENSITIVITY)    EKG: EKG Interpretation Date/Time:  Monday June 22 2024 11:41:45 EDT Ventricular Rate:  51 PR Interval:  160 QRS Duration:  103 QT Interval:  480 QTC Calculation: 443 R Axis:   31  Text Interpretation: Sinus rhythm Abnormal R-wave progression, early transition Confirmed by Melvenia Motto ([id number]) on 06/22/2024 12:07:52 PM  Radiology: CT ABDOMEN PELVIS W CONTRAST Result Date: 06/22/2024 CLINICAL DATA:  Nausea/vomiting/diarrhea since Friday, dysuria EXAM: CT ABDOMEN AND PELVIS WITH CONTRAST TECHNIQUE: Multidetector CT imaging of the abdomen and pelvis was performed using the standard protocol following bolus administration of intravenous contrast. RADIATION DOSE REDUCTION: This exam was performed according to the departmental dose-optimization program which includes automated exposure control, adjustment of the mA and/or kV according to patient size and/or use of iterative reconstruction technique. CONTRAST:  OMNIPAQUE IOHEXOL 300 MG/ML  SOLN COMPARISON:  11/29/2023 FINDINGS: Lower chest: No acute pleural or parenchymal lung disease. Hepatobiliary: 1 cm noncalcified gallstone versus tumefactive sludge within the gallbladder neck. No evidence of cholecystitis. Liver is unremarkable. No biliary duct dilation. Pancreas:  Unremarkable. No pancreatic ductal dilatation  or surrounding inflammatory changes. Spleen: Stable borderline splenomegaly, measuring 12.5 cm in craniocaudal length. No focal parenchymal abnormalities. Adrenals/Urinary Tract: The adrenals are stable. Kidneys enhance normally and symmetrically. No urinary tract calculi or obstructive uropathy within either kidney. The bladder is unremarkable. Stomach/Bowel: No bowel obstruction or ileus. The appendix, if still present, is not identified. Diverticulosis of the sigmoid colon with no evidence of acute diverticulitis. No bowel wall thickening or inflammatory change. Vascular/Lymphatic: Aortic atherosclerosis. No enlarged abdominal or pelvic lymph nodes. Reproductive: Status post hysterectomy. No adnexal masses. Other: No free fluid or free intraperitoneal gas. No abdominal wall hernia. Musculoskeletal: No acute or destructive bony abnormalities. Reconstructed images demonstrate no additional findings. IMPRESSION: 1. Noncalcified gallstone versus gallbladder sludge. No evidence of acute cholecystitis. 2. Sigmoid diverticulosis without diverticulitis. 3. Stable borderline splenomegaly. 4.  Aortic Atherosclerosis (ICD10-I70.0). Electronically Signed   By: Ozell Daring M.D.   On: 06/22/2024 15:04   CT Head Wo Contrast Result Date: 06/22/2024 CLINICAL DATA:  Headache, dizziness EXAM: CT HEAD WITHOUT CONTRAST TECHNIQUE: Contiguous axial images were obtained from the base of the skull through the vertex without intravenous contrast. RADIATION DOSE REDUCTION: This exam was performed according to the departmental dose-optimization program which includes automated exposure control, adjustment of the mA and/or kV according to patient size and/or use of iterative reconstruction technique. COMPARISON:  02/17/2024, 06/22/2024 FINDINGS: Brain: No acute infarct or hemorrhage. Lateral ventricles and midline structures are unremarkable. No acute extra-axial fluid collections. No mass  effect. Vascular: No hyperdense vessel or unexpected calcification. Skull: Normal. Negative for fracture or focal lesion. Sinuses/Orbits: No acute finding. Other: None. IMPRESSION: 1. Stable head CT, no acute intracranial process. Electronically Signed   By: Ozell Daring M.D.   On: 06/22/2024 14:58   MR BRAIN WO CONTRAST Result Date: 06/22/2024 CLINICAL DATA:  Syncope/presyncope, concern for cerebrovascular cause. EXAM: MRI HEAD WITHOUT CONTRAST TECHNIQUE: Multiplanar, multiecho pulse sequences of the brain and surrounding structures were obtained without intravenous contrast. COMPARISON:  MRI head 04/06/2020.  CT head 02/17/2024. FINDINGS: Brain: No acute infarct. No evidence of intracranial hemorrhage. White matter is unremarkable. Mild generalized parenchymal volume loss is similar to prior and normal for age. No edema, mass effect, or midline shift. Posterior fossa is unremarkable. Normal appearance of midline structures. The basilar cisterns are patent. No extra-axial fluid collections. Ventricles: Normal size and configuration of the ventricles. Vascular: Skull base flow voids are visualized. Skull and upper cervical spine: No focal abnormality. Sinuses/Orbits: Bilateral lens replacement. The paranasal sinuses are clear. Other: Trace fluid in the left mastoid tip. IMPRESSION: No acute intracranial abnormality. Electronically Signed   By: Donnice Mania M.D.   On: 06/22/2024 14:44     Procedures   Medications Ordered in the ED  ondansetron  (ZOFRAN ) injection 4 mg (has no administration in time range)  lactated ringers  bolus 1,000 mL (1,000 mLs Intravenous Bolus 06/22/24 1442)  meclizine (ANTIVERT) tablet 25 mg (25 mg Oral Given 06/22/24 1443)  iohexol (OMNIPAQUE) 300 MG/ML solution 100 mL (100 mLs Intravenous Contrast Given 06/22/24 1418)  nitroGLYCERIN (NITROGLYN) 2 % ointment 1 inch (1 inch Topical Given 06/22/24 1518)                                    Medical Decision Making Amount and/or  Complexity of Data Reviewed Labs: ordered. Radiology: ordered.  Risk Prescription drug management.   This patient presents to the ED for concern of nausea,  vomiting, dizziness, this involves an extensive number of treatment options, and is a complaint that carries with it a high risk of complications and morbidity.  The differential diagnosis includes dehydration, metabolic derangements, resistant UTI, polypharmacy   Co morbidities / Chronic conditions that complicate the patient evaluation  DM, anxiety, dementia, HLD, COPD, arthritis   Additional history obtained:  Additional history obtained from EMR External records from outside source obtained and reviewed including N/A   Lab Tests:  I Ordered, and personally interpreted labs.  The pertinent results include: Normal hemoglobin, no leukocytosis, normal kidney function, normal electrolytes.  Urinalysis shows improvement from recent prior study.   Imaging Studies ordered:  I ordered imaging studies including CT of head, abdomen, pelvis; MRI brain I independently visualized and interpreted imaging which showed no acute findings I agree with the radiologist interpretation   Cardiac Monitoring: / EKG:  The patient was maintained on a cardiac monitor.  I personally viewed and interpreted the cardiac monitored which showed an underlying rhythm of: Sinus rhythm   Problem List / ED Course / Critical interventions / Medication management  Patient presenting for worsened nausea, vomiting, dizziness.  Seen in the ED recently and diagnosed with UTI.  She has been taking her home medications.  She resides in an assisted living facility.  She was unable to walk today due to positional dizziness.  She had to crawl to the bathroom.  Also endorses some photophobia.  On assessment, patient is overall well-appearing.  At rest in recumbent position in a dark room, her symptoms are minimal.  Given her ongoing vomiting, IV fluids were ordered for  hydration.  Will treat positional dizziness with meclizine.  Patient to undergo repeat lab work today in addition to CT scan of abdomen and pelvis.  There is concern of possible central cause of her dizziness.  Will seek MRI.  MRI showed no acute findings.  Patient's dizziness did improve on meclizine.  Her blood pressure is elevated while in the ED.  She was given some nitro ointment but blood pressure subsequently dropped to the 110s SBP.  Ointment was removed.  Patient now with very slight hypertension.  She had resolution of nausea and was able to eat and drink while in the ED.  Laboratory workup is reassuring.  Patient underwent trial of ambulation.  She was able to ambulate with minimal assistance.  She was prescribed meclizine for ongoing vestibular rehab.  She was discharged in stable condition. I ordered medication including IV fluids for hydration, meclizine for dizziness, NTG for HTN Reevaluation of the patient after these medicines showed that the patient improved I have reviewed the patients home medicines and have made adjustments as needed  Social Determinants of Health:  Resides in assisted living facility     Final diagnoses:  Dizziness  Nausea    ED Discharge Orders          Ordered    meclizine (ANTIVERT) 12.5 MG tablet  3 times daily PRN        06/22/24 1650    ondansetron  (ZOFRAN -ODT) 4 MG disintegrating tablet  Every 8 hours PRN        06/22/24 1656               Melvenia Motto, MD 06/22/24 1656

## 2024-06-22 NOTE — ED Notes (Signed)
 Pt ambulated from room to bathroom and back. She was a little wobbly, endorsed slight dizziness but was able to walk by herself to the bathroom with me as a standby assist. She also had to use me to stabilize herself a couple time but she did fairly well.

## 2024-06-22 NOTE — ED Notes (Signed)
 nitroglycerin patch removed due to lowered BP

## 2024-06-22 NOTE — ED Provider Notes (Signed)
  EMERGENCY DEPARTMENT AT [facility] Provider Note   CSN: [id number] Arrival date & time: 06/20/24  9060     Patient presents with: Near Syncope   [name] is a 73 y.o. female.   Patient had a syncopal episode today.  Patient states she has been having some dizziness.  The history is provided by the patient and medical records. No language interpreter was used.  Near Syncope This is a new problem. The current episode started less than 1 hour ago. The problem occurs rarely. The problem has been resolved. Pertinent negatives include no chest pain, no abdominal pain and no headaches. Nothing aggravates the symptoms. Nothing relieves the symptoms. She has tried nothing for the symptoms.       Prior to Admission medications   Medication Sig Start Date End Date Taking? Authorizing Provider  acyclovir  (ZOVIRAX ) 400 MG tablet TAKE 1 TABLET BY MOUTH EVERY TWELVE HOURS (AT BREAKFAST AND AT BEDTIME) 02/03/24   Zarwolo, Gloria, FNP  alendronate  (FOSAMAX ) 70 MG tablet TAKE 1 TABLET BY MOUTH ONCE A WEEK, TAKE with full GLASS of water  ON an EMPTY stomach 10/04/23   Zarwolo, Gloria, FNP  Apple Cider Vinegar 300 MG TABS Take by mouth.    [provider]  aspirin  81 MG chewable tablet Chew 1 tablet (81 mg total) by mouth 2 (two) times daily. 09/14/19   Chadwell, Joshua, PA-C  atorvastatin  (LIPITOR) 20 MG tablet TAKE 1 TABLET BY MOUTH AT BEDTIME 11/04/23   Zarwolo, Gloria, FNP  Blood Pressure Monitoring (BLOOD PRESSURE MONITOR/L CUFF) MISC Dx: R03.0, elevated bp in office 08/01/22   Zarwolo, Gloria, FNP  cephALEXin (KEFLEX) 500 MG capsule Take 1 capsule (500 mg total) by mouth 4 (four) times daily. 06/20/24   Aniesa Boback, MD  cholecalciferol (VITAMIN D3) 25 MCG (1000 UNIT) tablet Take 1,000 Units by mouth daily.    [provider]  CLARITIN 10 MG tablet Take 10 mg by mouth daily. 03/30/24   [provider]  Continuous Glucose Sensor (DEXCOM G7  SENSOR) MISC USE AS DIRECTED TO test BLOOD SUGAR FOUR TIMES DAILY. CHANGE sensor EVERY 10 DAYS 01/07/24   Zarwolo, Gloria, FNP  donepezil  (ARICEPT ) 10 MG tablet TAKE 1 TABLET BY MOUTH EVERY DAY 11/04/23   Zarwolo, Gloria, FNP  EPINEPHrine  0.3 mg/0.3 mL IJ SOAJ injection Inject 0.3 mg into the muscle as needed for anaphylaxis. 05/24/22   Zarwolo, Gloria, FNP  escitalopram  (LEXAPRO ) 20 MG tablet TAKE 1 TABLET BY MOUTH EVERY DAY 02/03/24   Del Orbe Polanco, Hilario, FNP  esomeprazole (NEXIUM) 40 MG capsule Take 40 mg by mouth 2 (two) times daily. 05/25/24   [provider]  famotidine (PEPCID) 40 MG tablet Take 40 mg by mouth daily. 05/11/24   [provider]  fosfomycin (MONUROL) 3 g PACK Take by mouth. 05/26/24   [provider]  glucose blood (TRUE METRIX BLOOD GLUCOSE TEST) test strip USE AS DIRECTED 08/08/22   Zarwolo, Gloria, FNP  insulin  glargine (LANTUS ) 100 UNIT/ML Solostar Pen Inject 20 Units into the skin at bedtime. 03/17/24   Therisa Benton PARAS, NP  insulin  lispro (HUMALOG ) 100 UNIT/ML KwikPen Inject 2-8 Units into the skin 3 (three) times daily. 03/17/24   Therisa Benton PARAS, NP  Insulin  Pen Needle (BD AUTOSHIELD DUO) 30G X 5 MM MISC USE AS DIRECTED 07/18/23   Zarwolo, Gloria, FNP  levothyroxine  (SYNTHROID ) 88 MCG tablet TAKE 1 TABLET BY MOUTH ONCE A DAY BEFORE BREAKFAST 01/02/24   Zarwolo, Gloria,  FNP  loperamide (IMODIUM) 2 MG capsule Take 2 mg by mouth See admin instructions. Take 2 mg PO after each loose stool as needed for diarrhea (max of 3 doses in 24 hours) 09/29/21   [provider]  methocarbamol  (ROBAXIN ) 500 MG tablet Take 1 tablet (500 mg total) by mouth 2 (two) times daily. 08/06/22   Zarwolo, Gloria, FNP  mirabegron  ER (MYRBETRIQ ) 25 MG TB24 tablet Take 1 tablet (25 mg total) by mouth daily. 02/13/24   Watt Rush, MD  Multiple Vitamins-Minerals (HAIR SKIN & NAILS ADVANCED) TABS Take 1 tablet by mouth daily. 05/18/24   [provider]  nystatin  cream  (MYCOSTATIN ) apply TO SKIN folds, UNDER breasts, abdominal folds, AND inguinal folds TWICE DAILY FOR candidasis 07/18/23   Zarwolo, Gloria, FNP  pantoprazole  (PROTONIX ) 40 MG tablet Take 1 tablet (40 mg total) by mouth 2 (two) times daily. 10/01/23   Zarwolo, Gloria, FNP  prazosin  (MINIPRESS ) 2 MG capsule TAKE ONE CAPSULE BY MOUTH AT BEDTIME 11/04/23   Zarwolo, Gloria, FNP  predniSONE  (DELTASONE ) 20 MG tablet Take 1 tablet (20 mg total) by mouth 2 (two) times daily with a meal. 07/11/22   Antonetta Rollene BRAVO, MD  Semaglutide  (RYBELSUS ) 14 MG TABS Take 1 tablet (14 mg total) by mouth daily. 03/17/24   Therisa Benton PARAS, NP  Specialty Vitamins Products (COLLAGEN ULTRA PO) Take by mouth.    [provider]  sucralfate (CARAFATE) 1 g tablet Take 1 g by mouth 4 (four) times daily. 05/11/24   [provider]  trospium  (SANCTURA ) 20 MG tablet TAKE 1 TABLET BY MOUTH TWICE DAILY 12/08/23   Watt Rush, MD  vitamin B-12 (CYANOCOBALAMIN ) 1000 MCG tablet Take 1,000 mcg by mouth daily.    [provider]  Vitamin D , Ergocalciferol , (DRISDOL ) 1.25 MG (50000 UNIT) CAPS capsule TAKE ONE CAPSULE BY MOUTH ONCE WEEKLY ON TUESDAY 02/04/24   Zarwolo, Gloria, FNP    Allergies: Coconut oil, Ambien [zolpidem tartrate], Atorvastatin , Codeine, Onion, Other, Rosuvastatin , Bee venom, and Sulfa antibiotics    Review of Systems  Constitutional:  Negative for appetite change and fatigue.  HENT:  Negative for congestion, ear discharge and sinus pressure.   Eyes:  Negative for discharge.  Respiratory:  Negative for cough.   Cardiovascular:  Positive for near-syncope. Negative for chest pain.  Gastrointestinal:  Negative for abdominal pain and diarrhea.  Genitourinary:  Negative for frequency and hematuria.  Musculoskeletal:  Negative for back pain.  Skin:  Negative for rash.  Neurological:  Positive for dizziness. Negative for seizures and headaches.  Psychiatric/Behavioral:  Negative for  hallucinations.     Updated Vital Signs BP (!) 164/58 (BP Location: Left Arm)   Pulse (!) 52   Temp 97.9 F (36.6 C) (Oral)   Resp 14   Ht 5' 7 (1.702 m)   Wt 78.1 kg   SpO2 95%   BMI 26.97 kg/m   Physical Exam Vitals and nursing note reviewed.  Constitutional:      Appearance: She is well-developed.  HENT:     Head: Normocephalic.     Nose: Nose normal.   Eyes:     General: No scleral icterus.    Conjunctiva/sclera: Conjunctivae normal.   Neck:     Thyroid : No thyromegaly.   Cardiovascular:     Rate and Rhythm: Normal rate and regular rhythm.     Heart sounds: No murmur heard.    No friction rub. No gallop.  Pulmonary:     Breath sounds: No  stridor. No wheezing or rales.  Chest:     Chest wall: No tenderness.  Abdominal:     General: There is no distension.     Tenderness: There is no abdominal tenderness. There is no rebound.   Musculoskeletal:        General: Normal range of motion.     Cervical back: Neck supple.  Lymphadenopathy:     Cervical: No cervical adenopathy.   Skin:    Findings: No erythema or rash.   Neurological:     Mental Status: She is alert and oriented to person, place, and time.     Motor: No abnormal muscle tone.     Coordination: Coordination normal.   Psychiatric:        Behavior: Behavior normal.     (all labs ordered are listed, but only abnormal results are displayed) Labs Reviewed  COMPREHENSIVE METABOLIC PANEL WITH GFR - Abnormal; Notable for the following components:      Result Value   Glucose, Bld 134 (*)    Calcium  8.8 (*)    Albumin 3.4 (*)    Alkaline Phosphatase 32 (*)    All other components within normal limits  URINALYSIS, ROUTINE W REFLEX MICROSCOPIC - Abnormal; Notable for the following components:   APPearance CLOUDY (*)    Leukocytes,Ua LARGE (*)    Bacteria, UA RARE (*)    All other components within normal limits  URINE CULTURE  CBC WITH DIFFERENTIAL/PLATELET  TROPONIN I (HIGH SENSITIVITY)   TROPONIN I (HIGH SENSITIVITY)    EKG: EKG Interpretation Date/Time:  Saturday June 20 2024 09:56:49 EDT Ventricular Rate:  49 PR Interval:  164 QRS Duration:  111 QT Interval:  482 QTC Calculation: 436 R Axis:   34  Text Interpretation: Sinus bradycardia Low voltage, precordial leads Nonspecific T abnormalities, lateral leads Confirmed by Suzette Pac [phone]) on 06/20/2024 11:56:41 AM  Radiology: [facility] Chest Port 1 View Result Date: 06/20/2024 CLINICAL DATA:  Weakness. EXAM: PORTABLE CHEST 1 VIEW COMPARISON:  02/17/2024 FINDINGS: Heart size and mediastinal contours appear normal. Aortic atherosclerotic calcifications. No pleural fluid, interstitial edema or airspace disease. IMPRESSION: 1. No acute cardiopulmonary disease. 2. Aortic Atherosclerosis (ICD10-I70.0). Electronically Signed   By: Waddell Calk M.D.   On: 06/20/2024 10:38     Procedures   Medications Ordered in the ED  sodium chloride  0.9 % bolus 500 mL (0 mLs Intravenous Stopped 06/20/24 1200)  cefTRIAXone (ROCEPHIN) 2 g in sodium chloride  0.9 % 100 mL IVPB (0 g Intravenous Stopped 06/20/24 1607)                                    Medical Decision Making Amount and/or Complexity of Data Reviewed Labs: ordered. Radiology: ordered.  Risk Prescription drug management.   Patient with a syncopal episode.  She is dehydrated and has a urinary tract infection and is mildly bradycardic.  Patient has been hydrated with fluids and given antibiotic for UTI.  She will follow-up with her PCP for UTI and follow-up with cardiology for the mild bradycardia     Final diagnoses:  Bradycardia  Acute cystitis without hematuria  Dehydration    ED Discharge Orders          Ordered    cephALEXin (KEFLEX) 500 MG capsule  4 times daily,   Status:  Discontinued        06/20/24 1448    Ambulatory referral to Cardiology  Comments: If you have not heard from the Cardiology office within the next 72 hours please call  [phone].   06/20/24 1453    cephALEXin (KEFLEX) 500 MG capsule  4 times daily,   Status:  Discontinued        06/20/24 1522    cephALEXin (KEFLEX) 500 MG capsule  4 times daily        06/20/24 1524               Suzette Pac, MD 06/22/24 [phone]

## 2024-06-22 NOTE — ED Triage Notes (Signed)
 [facility] complains of N/V/D since Friday, seen Saturday but states symptoms have gotten worse. Endorses Sore throat, pain w/ urination, and dizziness. Recent UTI diagnosis. Pt AAOx4, normally ambulatory but endorse severe dizziness .

## 2024-06-23 LAB — URINE CULTURE: Culture: >=100000 — AB

## 2024-06-24 ENCOUNTER — Telehealth (HOSPITAL_BASED_OUTPATIENT_CLINIC_OR_DEPARTMENT_OTHER): Payer: Self-pay | Admitting: *Deleted

## 2024-06-24 NOTE — Telephone Encounter (Signed)
 Post ED Visit - Positive Culture Follow-up  Culture report reviewed by antimicrobial stewardship pharmacist: [facility] [x]  [place], [place].D. []  Venetia Gully, Pharm.D., BCPS AQ-ID []  Garrel Crews, Pharm.D., BCPS []  Almarie Lunger, Pharm.D., BCPS []  [place], [address].D., BCPS, AAHIVP []  Rosaline Bihari, Pharm.D., BCPS, AAHIVP []  Vernell Meier, PharmD, BCPS []  Latanya Hint, PharmD, BCPS []  Donald Medley, PharmD, BCPS []  Rocky Bold, PharmD []  Dorothyann Alert, PharmD, BCPS []  Morene Babe, PharmD  [facility] []  Rosaline Edison, PharmD []  Romona Bliss, PharmD []  Dolphus Roller, PharmD []  Veva Seip, Rph []  Vernell Daunt) Leonce, PharmD []  Eva Allis, PharmD []  Rosaline Millet, PharmD []  Iantha Batch, PharmD []  Arvin Gauss, PharmD []  Wanda Hasting, PharmD []  Ronal Rav, PharmD []  Rocky Slade, PharmD []  Bard Jeans, PharmD   Positive urine culture Treated with cephalexin & fosfomycin, organism sensitive to the same and no further patient follow-up is required at this time.  Lorita Barnie Pereyra 06/24/2024, 9:23 AM

## 2024-06-25 DIAGNOSIS — F0390 Unspecified dementia without behavioral disturbance: Secondary | ICD-10-CM | POA: Diagnosis not present

## 2024-06-25 DIAGNOSIS — F411 Generalized anxiety disorder: Secondary | ICD-10-CM | POA: Diagnosis not present

## 2024-06-29 DIAGNOSIS — J309 Allergic rhinitis, unspecified: Secondary | ICD-10-CM | POA: Diagnosis not present

## 2024-06-29 DIAGNOSIS — E039 Hypothyroidism, unspecified: Secondary | ICD-10-CM | POA: Diagnosis not present

## 2024-06-29 DIAGNOSIS — Z96651 Presence of right artificial knee joint: Secondary | ICD-10-CM | POA: Diagnosis not present

## 2024-06-29 DIAGNOSIS — M25561 Pain in right knee: Secondary | ICD-10-CM | POA: Diagnosis not present

## 2024-06-29 DIAGNOSIS — M25571 Pain in right ankle and joints of right foot: Secondary | ICD-10-CM | POA: Diagnosis not present

## 2024-06-29 DIAGNOSIS — K219 Gastro-esophageal reflux disease without esophagitis: Secondary | ICD-10-CM | POA: Diagnosis not present

## 2024-07-06 DIAGNOSIS — Z96651 Presence of right artificial knee joint: Secondary | ICD-10-CM | POA: Diagnosis not present

## 2024-07-06 DIAGNOSIS — M25561 Pain in right knee: Secondary | ICD-10-CM | POA: Diagnosis not present

## 2024-07-06 DIAGNOSIS — M25571 Pain in right ankle and joints of right foot: Secondary | ICD-10-CM | POA: Diagnosis not present

## 2024-07-06 DIAGNOSIS — L309 Dermatitis, unspecified: Secondary | ICD-10-CM | POA: Diagnosis not present

## 2024-07-08 DIAGNOSIS — F515 Nightmare disorder: Secondary | ICD-10-CM | POA: Diagnosis not present

## 2024-07-08 DIAGNOSIS — F411 Generalized anxiety disorder: Secondary | ICD-10-CM | POA: Diagnosis not present

## 2024-07-08 DIAGNOSIS — F331 Major depressive disorder, recurrent, moderate: Secondary | ICD-10-CM | POA: Diagnosis not present

## 2024-07-08 DIAGNOSIS — F0390 Unspecified dementia without behavioral disturbance: Secondary | ICD-10-CM | POA: Diagnosis not present

## 2024-07-10 ENCOUNTER — Encounter: Payer: Self-pay | Admitting: Emergency Medicine

## 2024-07-14 ENCOUNTER — Encounter: Payer: Self-pay | Admitting: Urology

## 2024-07-14 ENCOUNTER — Ambulatory Visit (INDEPENDENT_AMBULATORY_CARE_PROVIDER_SITE_OTHER): Admitting: Urology

## 2024-07-14 VITALS — BP 145/82 | HR 65

## 2024-07-14 DIAGNOSIS — Z87440 Personal history of urinary (tract) infections: Secondary | ICD-10-CM

## 2024-07-14 DIAGNOSIS — N3941 Urge incontinence: Secondary | ICD-10-CM

## 2024-07-14 DIAGNOSIS — R351 Nocturia: Secondary | ICD-10-CM | POA: Diagnosis not present

## 2024-07-14 DIAGNOSIS — N393 Stress incontinence (female) (male): Secondary | ICD-10-CM

## 2024-07-14 DIAGNOSIS — F02A4 Dementia in other diseases classified elsewhere, mild, with anxiety: Secondary | ICD-10-CM

## 2024-07-14 DIAGNOSIS — R350 Frequency of micturition: Secondary | ICD-10-CM | POA: Insufficient documentation

## 2024-07-14 DIAGNOSIS — F0394 Unspecified dementia, unspecified severity, with anxiety: Secondary | ICD-10-CM | POA: Diagnosis not present

## 2024-07-14 DIAGNOSIS — R3915 Urgency of urination: Secondary | ICD-10-CM | POA: Diagnosis not present

## 2024-07-14 DIAGNOSIS — R109 Unspecified abdominal pain: Secondary | ICD-10-CM | POA: Diagnosis not present

## 2024-07-14 DIAGNOSIS — N3946 Mixed incontinence: Secondary | ICD-10-CM | POA: Diagnosis not present

## 2024-07-14 DIAGNOSIS — R4182 Altered mental status, unspecified: Secondary | ICD-10-CM | POA: Diagnosis not present

## 2024-07-14 DIAGNOSIS — N3281 Overactive bladder: Secondary | ICD-10-CM

## 2024-07-14 LAB — BLADDER SCAN AMB NON-IMAGING: Scan Result: 39

## 2024-07-14 MED ORDER — MIRABEGRON ER 25 MG PO TB24: 25.0000 mg | ORAL_TABLET | Freq: Every day | ORAL | 3 refills | Status: AC

## 2024-07-14 NOTE — Progress Notes (Signed)
 Name: [name] DOB: [birth year] MRN: [id number]  History of Present Illness: [name] is a 73 y.o. female who presents alone today for follow up visit at [facility] [place]. She resides at the [facility]. She is a questionable historian due to dementia. Relevant History includes: 1. OAB with urinary frequency, nocturia, urgency, and urge incontinence. - 02/26/2022: Urodynamic testing showed a slightly decreased capacity of 394 mL. She did have unstable bladder contractions with associated leakage. - Failed Vesicare. - Failed Gemtesa . - Failed PTNS. 2. Vaginal atrophy.  At last visit with Dr. Watt on 02/13/2024: The plan was to continue Trospium  20 mg 2x/day and Myrbetriq  25 mg daily.  Since last visit: > 06/20/2024: Seen in ER for syncopal episode. Per note: She is dehydrated and has a urinary tract infection and is mildly bradycardic. Treated with IV fluids and antibiotic for UTI (Keflex ). Urine culture positive for Klebsiella pneumoniae.  Today: She reports bothersome urinary frequency, nocturia, urgency, and urge incontinence. Also reports stress urinary incontinence. Leaking large volume of urine multiple times per day on average; using many pads per day on average. She drinking 2 caffeinated beverages per day on average.  She reports that her urinary symptoms were better a few months ago. Per review of current medication list from the [facility] she has not been taking Myrbetriq  or Trospium  recently; unknown for how long.  She denies dysuria, gross hematuria, straining to void, or sensations of incomplete emptying.   Medications: Current Outpatient Medications  Medication Sig Dispense Refill   acyclovir  (ZOVIRAX ) 400 MG tablet TAKE 1 TABLET BY MOUTH EVERY TWELVE HOURS (AT BREAKFAST AND AT BEDTIME) 90 tablet 1   alendronate  (FOSAMAX ) 70 MG tablet TAKE 1 TABLET BY MOUTH ONCE A WEEK, TAKE with full GLASS of water  ON an EMPTY  stomach 4 tablet 11   Apple Cider Vinegar 300 MG TABS Take by mouth.     aspirin  81 MG chewable tablet Chew 1 tablet (81 mg total) by mouth 2 (two) times daily. 60 tablet 0   atorvastatin  (LIPITOR) 20 MG tablet TAKE 1 TABLET BY MOUTH AT BEDTIME 90 tablet 1   Blood Pressure Monitoring (BLOOD PRESSURE MONITOR/L CUFF) MISC Dx: R03.0, elevated bp in office 1 each 0   cholecalciferol (VITAMIN D3) 25 MCG (1000 UNIT) tablet Take 1,000 Units by mouth daily.     CLARITIN 10 MG tablet Take 10 mg by mouth daily.     Continuous Glucose Sensor (DEXCOM G7 SENSOR) MISC USE AS DIRECTED TO test BLOOD SUGAR FOUR TIMES DAILY. CHANGE sensor EVERY 10 DAYS 3 each 2   donepezil  (ARICEPT ) 10 MG tablet TAKE 1 TABLET BY MOUTH EVERY DAY 90 tablet 1   EPINEPHrine  0.3 mg/0.3 mL IJ SOAJ injection Inject 0.3 mg into the muscle as needed for anaphylaxis. 1 each 2   escitalopram  (LEXAPRO ) 20 MG tablet TAKE 1 TABLET BY MOUTH EVERY DAY 30 tablet 2   esomeprazole (NEXIUM) 40 MG capsule Take 40 mg by mouth 2 (two) times daily.     famotidine (PEPCID) 40 MG tablet Take 40 mg by mouth daily.     glucose blood (TRUE METRIX BLOOD GLUCOSE TEST) test strip USE AS DIRECTED 100 each 2   insulin  glargine (LANTUS ) 100 UNIT/ML Solostar Pen Inject 20 Units into the skin at bedtime. 18 mL 3   insulin  lispro (HUMALOG ) 100 UNIT/ML KwikPen Inject 2-8 Units into the skin 3 (three) times daily. 21 mL 3   Insulin  Pen Needle (  BD AUTOSHIELD DUO) 30G X 5 MM MISC USE AS DIRECTED 100 each 2   levothyroxine  (SYNTHROID ) 88 MCG tablet TAKE 1 TABLET BY MOUTH ONCE A DAY BEFORE BREAKFAST 90 tablet 1   loperamide (IMODIUM) 2 MG capsule Take 2 mg by mouth See admin instructions. Take 2 mg PO after each loose stool as needed for diarrhea (max of 3 doses in 24 hours)     meclizine  (ANTIVERT ) 12.5 MG tablet Take 1 tablet (12.5 mg total) by mouth 3 (three) times daily as needed for dizziness. 30 tablet 0   methocarbamol  (ROBAXIN ) 500 MG tablet Take 1 tablet (500 mg  total) by mouth 2 (two) times daily. 30 tablet 0   mirabegron  ER (MYRBETRIQ ) 25 MG TB24 tablet Take 1 tablet (25 mg total) by mouth daily. 90 tablet 3   Multiple Vitamins-Minerals (HAIR SKIN & NAILS ADVANCED) TABS Take 1 tablet by mouth daily.     nystatin  cream (MYCOSTATIN ) apply TO SKIN folds, UNDER breasts, abdominal folds, AND inguinal folds TWICE DAILY FOR candidasis 30 g 1   ondansetron  (ZOFRAN -ODT) 4 MG disintegrating tablet Take 1 tablet (4 mg total) by mouth every 8 (eight) hours as needed. 20 tablet 0   pantoprazole  (PROTONIX ) 40 MG tablet Take 1 tablet (40 mg total) by mouth 2 (two) times daily. 90 tablet 1   prazosin  (MINIPRESS ) 2 MG capsule TAKE ONE CAPSULE BY MOUTH AT BEDTIME 90 capsule 1   predniSONE  (DELTASONE ) 20 MG tablet Take 1 tablet (20 mg total) by mouth 2 (two) times daily with a meal. 10 tablet 0   Semaglutide  (RYBELSUS ) 14 MG TABS Take 1 tablet (14 mg total) by mouth daily. 90 tablet 3   Specialty Vitamins Products (COLLAGEN ULTRA PO) Take by mouth.     sucralfate (CARAFATE) 1 g tablet Take 1 g by mouth 4 (four) times daily.     vitamin B-12 (CYANOCOBALAMIN ) 1000 MCG tablet Take 1,000 mcg by mouth daily.     Vitamin D , Ergocalciferol , (DRISDOL ) 1.25 MG (50000 UNIT) CAPS capsule TAKE ONE CAPSULE BY MOUTH ONCE WEEKLY ON TUESDAY 10 capsule 1   No current facility-administered medications for this visit.    Allergies: Allergies  Allergen Reactions   Coconut Oil Anaphylaxis and Swelling    Mouth swelling   Ambien [Zolpidem Tartrate] Other (See Comments)    Hallucinations/nightmares   Atorvastatin  Other (See Comments)    Leg cramps   Codeine Nausea And Vomiting   Onion Nausea Only and Other (See Comments)    Raw onions   Other Diarrhea and Other (See Comments)    Ground Beef (patient avoid due to side effect of diarrhea)   Rosuvastatin  Other (See Comments)    Myalgias    Bee Venom Swelling   Sulfa Antibiotics Rash and Other (See Comments)    Including topical  sulfa  (mouth dries/peels/skin blisters)    Past Medical History:  Diagnosis Date   Anemia    as a teenager   Anxiety    Arthritis    knees, rt hand,hips   Cancer (HCC)    vaginal,uterine,ovariam   COPD (chronic obstructive pulmonary disease) (HCC)    Depression    Diabetes mellitus without complication (HCC)    Hypothyroidism    Neuromuscular disorder (HCC)    hands and feet   Past Surgical History:  Procedure Laterality Date   ABDOMINAL HYSTERECTOMY     BIOPSY  08/06/2023   Procedure: BIOPSY;  Surgeon: Cindie Carlin POUR, DO;  Location: [facility];  Service: Endoscopy;;   BREAST SURGERY Right 1988   lumpectomy   COLONOSCOPY WITH PROPOFOL  N/A 08/06/2023   Procedure: COLONOSCOPY WITH PROPOFOL ;  Surgeon: Cindie Carlin POUR, DO;  Location: [facility];  Service: Endoscopy;  Laterality: N/A;  900am, asa 2   ESOPHAGOGASTRODUODENOSCOPY (EGD) WITH PROPOFOL  N/A 08/06/2023   Procedure: ESOPHAGOGASTRODUODENOSCOPY (EGD) WITH PROPOFOL ;  Surgeon: Cindie Carlin POUR, DO;  Location: [facility];  Service: Endoscopy;  Laterality: N/A;   EYE SURGERY Bilateral    FRACTURE SURGERY     Rt ankle,lt knee cap,lt shoulder,rt wrist,rt shoulder,   POLYPECTOMY  08/06/2023   Procedure: POLYPECTOMY;  Surgeon: Cindie Carlin POUR, DO;  Location: [facility];  Service: Endoscopy;;   TONSILLECTOMY     at age 33   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   Procedure: TOTAL KNEE ARTHROPLASTY;  Surgeon: Shari Sieving, MD;  Location: [facility];  Service: Orthopedics;  Laterality: Right;   Family History  Problem Relation Age of Onset   Cancer Mother    Hypertension Mother    Thyroid  disease Mother    Diabetes Mother    Stroke Mother    Heart failure Mother    Diabetes Father    Heart attack Father    Diabetes Sister    Diabetes Brother    Social History   Socioeconomic History   Marital status: Married    Spouse name: Not on file   Number of children: Not on file   Years of education: Not on  file   Highest education level: Not on file  Occupational History   Not on file  Tobacco Use   Smoking status: Former    Current packs/day: 0.00    Average packs/day: 0.5 packs/day for 15.0 years (7.5 ttl pk-yrs)    Types: Cigarettes    Start date: 78    Quit date: 2013    Years since quitting: 12.5   Smokeless tobacco: Never  Vaping Use   Vaping status: Never Used  Substance and Sexual Activity   Alcohol use: Never   Drug use: Never   Sexual activity: Not on file  Other Topics Concern   Not on file  Social History Narrative   Not on file   Social Drivers of Health   Financial Resource Strain: Low Risk  (07/25/2023)   Overall Financial Resource Strain (CARDIA)    Difficulty of Paying Living Expenses: Not hard at all  Food Insecurity: No Food Insecurity (07/25/2023)   Hunger Vital Sign    Worried About Running Out of Food in the Last Year: Never true    Ran Out of Food in the Last Year: Never true  Transportation Needs: No Transportation Needs (07/25/2023)   PRAPARE - Administrator, Civil Service (Medical): No    Lack of Transportation (Non-Medical): No  Physical Activity: Sufficiently Active (07/25/2023)   Exercise Vital Sign    Days of Exercise per Week: 7 days    Minutes of Exercise per Session: 30 min  Stress: Stress Concern Present (07/25/2023)   [employer] of Occupational Health - Occupational Stress Questionnaire    Feeling of Stress : Very much  Social Connections: Moderately Isolated (07/25/2023)   Social Connection and Isolation Panel    Frequency of Communication with Friends and Family: More than three times a week    Frequency of Social Gatherings with Friends and Family: More than three times a week    Attends Religious Services: Never    Production manager of [employer]  or Organizations: No    Attends Banker Meetings: Never    Marital Status: Married  Catering manager Violence: Not At Risk (07/25/2023)   Humiliation, Afraid, Rape, and Kick  questionnaire    Fear of Current or Ex-Partner: No    Emotionally Abused: No    Physically Abused: No    Sexually Abused: No    Review of Systems Constitutional: Patient denies any unintentional weight loss or change in strength lntegumentary: Patient denies any rashes or pruritus Cardiovascular: Patient denies chest pain or syncope Respiratory: Patient denies shortness of breath Gastrointestinal: Patient denies constipation; reports loose stools Musculoskeletal: Patient denies muscle cramps or weakness Neurologic: Patient denies convulsions or seizures Allergic/Immunologic: Patient denies recent allergic reaction(s) Hematologic/Lymphatic: Patient denies bleeding tendencies Endocrine: Patient denies heat/cold intolerance  GU: As per HPI.  OBJECTIVE Vitals:   07/14/24 1435  BP: (!) 145/82  Pulse: 65   There is no height or weight on file to calculate BMI.  Physical Examination Constitutional: No obvious distress; patient is non-toxic appearing  Cardiovascular: No visible lower extremity edema.  Respiratory: The patient does not have audible wheezing/stridor; respirations do not appear labored  Gastrointestinal: Abdomen non-distended Musculoskeletal: Normal ROM of UEs  Skin: No obvious rashes/open sores  Neurologic: CN 2-12 grossly intact Psychiatric: Answered questions appropriately with normal affect  Hematologic/Lymphatic/Immunologic: No obvious bruises or sites of spontaneous bleeding  UA: Patient did not void in office today - reported urinating just prior to arrival  PVR: 39 ml  ASSESSMENT Overactive bladder - Plan: BLADDER SCAN AMB NON-IMAGING, Urinalysis, Routine w reflex microscopic, Ambulatory referral to Urology, mirabegron  ER (MYRBETRIQ ) 25 MG TB24 tablet  Urge incontinence - Plan: Ambulatory referral to Urology  Urinary frequency - Plan: Ambulatory referral to Urology  Urinary urgency - Plan: Ambulatory referral to Urology  Nocturia - Plan: Ambulatory  referral to Urology  History of UTI  Mixed stress and urge urinary incontinence - Plan: Ambulatory referral to Urology  SUI (stress urinary incontinence, female) - Plan: Ambulatory referral to Urology  Mild dementia associated with other underlying disease, with anxiety (HCC)  We discussed the symptoms of overactive bladder (OAB), which include urinary urgency, frequency, nocturia, with or without urge incontinence. While we may not know the exact etiology of OAB, several risk factors can be identified.  - Patient's neurogenic risk factors: T2DM, prior nicotine use, dementia.  - Patient's exacerbating factors include: caffeine intake  Suspect that her worsened OAB symptoms are due at least in part to her not being on any medications for her bladder at this time; advised her to restart Myrbetriq  (Mirabegron ) 25 mg daily. We agreed to hold off on restarting [place]  (Trospium ) due to the risk of cognitive impairment with the use of anticholinergic medications, particularly since she already has dementia. Advised her to avoid caffeine intake, however she seems to conflate caffeine with sugar / sugar substitutes despite repeat attempts to clarify their distinctions. She seems aware to continue working on her glycemic control for her diabetes, which may help with her urinary symptoms as well.   She is reporting stress urinary incontinence, which appears to be a new complaint. Advised repeat urodynamic testing to further evaluate.   Patient verbalized understanding and agreement. All questions were answered.  PLAN Advised the following: 1. Restart Myrbetriq  (Mirabegron ) 25 mg daily. 2. Minimize / avoid caffeine intake. 3. Return for urodynamic testing at [facility] then f/u here with MD for test interpretation / next steps.  Orders Placed This Encounter  Procedures  Urinalysis, Routine w reflex microscopic   Ambulatory referral to Urology    Referral Priority:   Routine    Referral  Type:   Consultation    Referral Reason:   Specialty Services Required    Requested Specialty:   Urology    Number of Visits Requested:   1   BLADDER SCAN AMB NON-IMAGING   Total time spent caring for the patient today was over 30 minutes. This includes time spent on the date of the visit reviewing the patient's chart before the visit, time spent during the visit, and time spent after the visit on documentation. Over 50% of that time was spent in face-to-face time with this patient for direct counseling. E&M based on time and complexity of medical decision making.  It has been explained that the patient is to follow regularly with their PCP in addition to all other providers involved in their care and to follow instructions provided by these respective offices. Patient advised to contact urology clinic if any urologic-pertaining questions, concerns, new symptoms or problems arise in the interim period.  There are no Patient Instructions on file for this visit.  Electronically signed by:  Lauraine JAYSON Oz, FNP   07/14/24    3:24 PM

## 2024-07-20 ENCOUNTER — Ambulatory Visit: Admitting: Nurse Practitioner

## 2024-07-20 DIAGNOSIS — M810 Age-related osteoporosis without current pathological fracture: Secondary | ICD-10-CM | POA: Diagnosis not present

## 2024-07-20 DIAGNOSIS — N390 Urinary tract infection, site not specified: Secondary | ICD-10-CM | POA: Diagnosis not present

## 2024-07-20 DIAGNOSIS — N3281 Overactive bladder: Secondary | ICD-10-CM | POA: Diagnosis not present

## 2024-07-20 DIAGNOSIS — E119 Type 2 diabetes mellitus without complications: Secondary | ICD-10-CM | POA: Diagnosis not present

## 2024-07-21 ENCOUNTER — Ambulatory Visit (INDEPENDENT_AMBULATORY_CARE_PROVIDER_SITE_OTHER): Admitting: Nurse Practitioner

## 2024-07-21 ENCOUNTER — Encounter: Payer: Self-pay | Admitting: Nurse Practitioner

## 2024-07-21 VITALS — BP 138/88 | HR 80 | Ht 67.0 in | Wt 176.2 lb

## 2024-07-21 DIAGNOSIS — E039 Hypothyroidism, unspecified: Secondary | ICD-10-CM

## 2024-07-21 DIAGNOSIS — E559 Vitamin D deficiency, unspecified: Secondary | ICD-10-CM

## 2024-07-21 DIAGNOSIS — Z7984 Long term (current) use of oral hypoglycemic drugs: Secondary | ICD-10-CM

## 2024-07-21 DIAGNOSIS — E1100 Type 2 diabetes mellitus with hyperosmolarity without nonketotic hyperglycemic-hyperosmolar coma (NKHHC): Secondary | ICD-10-CM

## 2024-07-21 DIAGNOSIS — E119 Type 2 diabetes mellitus without complications: Secondary | ICD-10-CM

## 2024-07-21 DIAGNOSIS — Z794 Long term (current) use of insulin: Secondary | ICD-10-CM

## 2024-07-21 LAB — POCT GLYCOSYLATED HEMOGLOBIN (HGB A1C): Hemoglobin A1C: 6.9 % — AB (ref 4.0–5.6)

## 2024-07-21 NOTE — Progress Notes (Signed)
 Endocrinology Follow Up Note       07/21/2024, 11:05 AM   Subjective:    Patient ID: [name], female    DOB: 1951/06/05.  [name] is being seen in follow up after being seen in consultation for management of currently uncontrolled symptomatic diabetes requested by  Leontine Phebe Charlotte ONEIDA, NP.   Past Medical History:  Diagnosis Date   Anemia    as a teenager   Anxiety    Arthritis    knees, rt hand,hips   Cancer (HCC)    vaginal,uterine,ovariam   COPD (chronic obstructive pulmonary disease) (HCC)    Depression    Diabetes mellitus without complication (HCC)    Hypothyroidism    Neuromuscular disorder (HCC)    hands and feet    Past Surgical History:  Procedure Laterality Date   ABDOMINAL HYSTERECTOMY     BIOPSY  08/06/2023   Procedure: BIOPSY;  Surgeon: Cindie Carlin MARLA, DO;  Location: [facility];  Service: Endoscopy;;   BREAST SURGERY Right 1988   lumpectomy   COLONOSCOPY WITH PROPOFOL  N/A 08/06/2023   Procedure: COLONOSCOPY WITH PROPOFOL ;  Surgeon: Cindie Carlin MARLA, DO;  Location: [facility];  Service: Endoscopy;  Laterality: N/A;  900am, asa 2   ESOPHAGOGASTRODUODENOSCOPY (EGD) WITH PROPOFOL  N/A 08/06/2023   Procedure: ESOPHAGOGASTRODUODENOSCOPY (EGD) WITH PROPOFOL ;  Surgeon: Cindie Carlin MARLA, DO;  Location: [facility];  Service: Endoscopy;  Laterality: N/A;   EYE SURGERY Bilateral    FRACTURE SURGERY     Rt ankle,lt knee cap,lt shoulder,rt wrist,rt shoulder,   POLYPECTOMY  08/06/2023   Procedure: POLYPECTOMY;  Surgeon: Cindie Carlin MARLA, DO;  Location: [facility];  Service: Endoscopy;;   TONSILLECTOMY     at age 35   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   Procedure: TOTAL KNEE ARTHROPLASTY;  Surgeon: Shari Sieving, MD;  Location: [facility];  Service: Orthopedics;  Laterality: Right;    Social History   Socioeconomic History   Marital status: Married    Spouse  name: Not on file   Number of children: Not on file   Years of education: Not on file   Highest education level: Not on file  Occupational History   Not on file  Tobacco Use   Smoking status: Former    Current packs/day: 0.00    Average packs/day: 0.5 packs/day for 15.0 years (7.5 ttl pk-yrs)    Types: Cigarettes    Start date: 66    Quit date: 2013    Years since quitting: 12.5   Smokeless tobacco: Never  Vaping Use   Vaping status: Never Used  Substance and Sexual Activity   Alcohol use: Never   Drug use: Never   Sexual activity: Not on file  Other Topics Concern   Not on file  Social History Narrative   Not on file   Social Drivers of Health   Financial Resource Strain: Low Risk  (07/25/2023)   Overall Financial Resource Strain (CARDIA)    Difficulty of Paying Living Expenses: Not hard at all  Food Insecurity: No Food Insecurity (07/25/2023)   Hunger Vital Sign    Worried About Running Out of Food in the  Last Year: Never true    Ran Out of Food in the Last Year: Never true  Transportation Needs: No Transportation Needs (07/25/2023)   PRAPARE - Administrator, Civil Service (Medical): No    Lack of Transportation (Non-Medical): No  Physical Activity: Sufficiently Active (07/25/2023)   Exercise Vital Sign    Days of Exercise per Week: 7 days    Minutes of Exercise per Session: 30 min  Stress: Stress Concern Present (07/25/2023)   [employer] of Occupational Health - Occupational Stress Questionnaire    Feeling of Stress : Very much  Social Connections: Moderately Isolated (07/25/2023)   Social Connection and Isolation Panel    Frequency of Communication with Friends and Family: More than three times a week    Frequency of Social Gatherings with Friends and Family: More than three times a week    Attends Religious Services: Never    Database administrator or Organizations: No    Attends Engineer, structural: Never    Marital Status: Married     Family History  Problem Relation Age of Onset   Cancer Mother    Hypertension Mother    Thyroid  disease Mother    Diabetes Mother    Stroke Mother    Heart failure Mother    Diabetes Father    Heart attack Father    Diabetes Sister    Diabetes Brother     Outpatient Encounter Medications as of 07/21/2024  Medication Sig   acyclovir  (ZOVIRAX ) 400 MG tablet TAKE 1 TABLET BY MOUTH EVERY TWELVE HOURS (AT BREAKFAST AND AT BEDTIME)   alendronate  (FOSAMAX ) 70 MG tablet TAKE 1 TABLET BY MOUTH ONCE A WEEK, TAKE with full GLASS of water  ON an EMPTY stomach   Apple Cider Vinegar 300 MG TABS Take by mouth.   aspirin  81 MG chewable tablet Chew 1 tablet (81 mg total) by mouth 2 (two) times daily.   atorvastatin  (LIPITOR) 20 MG tablet TAKE 1 TABLET BY MOUTH AT BEDTIME   Blood Pressure Monitoring (BLOOD PRESSURE MONITOR/L CUFF) MISC Dx: R03.0, elevated bp in office   cholecalciferol (VITAMIN D3) 25 MCG (1000 UNIT) tablet Take 1,000 Units by mouth daily.   CLARITIN 10 MG tablet Take 10 mg by mouth daily.   Continuous Glucose Receiver (FREESTYLE LIBRE 2 READER) DEVI    Continuous Glucose Sensor (DEXCOM G7 SENSOR) MISC USE AS DIRECTED TO test BLOOD SUGAR FOUR TIMES DAILY. CHANGE sensor EVERY 10 DAYS   donepezil  (ARICEPT ) 10 MG tablet TAKE 1 TABLET BY MOUTH EVERY DAY   EPINEPHrine  0.3 mg/0.3 mL IJ SOAJ injection Inject 0.3 mg into the muscle as needed for anaphylaxis.   escitalopram  (LEXAPRO ) 20 MG tablet TAKE 1 TABLET BY MOUTH EVERY DAY   esomeprazole (NEXIUM) 40 MG capsule Take 40 mg by mouth 2 (two) times daily.   famotidine (PEPCID) 40 MG tablet Take 40 mg by mouth daily.   glucose blood (TRUE METRIX BLOOD GLUCOSE TEST) test strip USE AS DIRECTED   insulin  glargine (LANTUS ) 100 UNIT/ML Solostar Pen Inject 20 Units into the skin at bedtime.   Insulin  Pen Needle (BD AUTOSHIELD DUO) 30G X 5 MM MISC USE AS DIRECTED   levothyroxine  (SYNTHROID ) 88 MCG tablet TAKE 1 TABLET BY MOUTH ONCE A DAY BEFORE  BREAKFAST   loperamide (IMODIUM) 2 MG capsule Take 2 mg by mouth See admin instructions. Take 2 mg PO after each loose stool as needed for diarrhea (max of 3 doses in 24 hours)  meclizine  (ANTIVERT ) 12.5 MG tablet Take 1 tablet (12.5 mg total) by mouth 3 (three) times daily as needed for dizziness.   methocarbamol  (ROBAXIN ) 500 MG tablet Take 1 tablet (500 mg total) by mouth 2 (two) times daily.   mirabegron  ER (MYRBETRIQ ) 25 MG TB24 tablet Take 1 tablet (25 mg total) by mouth daily.   Multiple Vitamins-Minerals (HAIR SKIN & NAILS ADVANCED) TABS Take 1 tablet by mouth daily.   nystatin  cream (MYCOSTATIN ) apply TO SKIN folds, UNDER breasts, abdominal folds, AND inguinal folds TWICE DAILY FOR candidasis   ondansetron  (ZOFRAN -ODT) 4 MG disintegrating tablet Take 1 tablet (4 mg total) by mouth every 8 (eight) hours as needed.   pantoprazole  (PROTONIX ) 40 MG tablet Take 1 tablet (40 mg total) by mouth 2 (two) times daily.   prazosin  (MINIPRESS ) 2 MG capsule TAKE ONE CAPSULE BY MOUTH AT BEDTIME   Semaglutide  (RYBELSUS ) 14 MG TABS Take 1 tablet (14 mg total) by mouth daily.   Specialty Vitamins Products (COLLAGEN ULTRA PO) Take by mouth.   sucralfate (CARAFATE) 1 g tablet Take 1 g by mouth 4 (four) times daily.   vitamin B-12 (CYANOCOBALAMIN ) 1000 MCG tablet Take 1,000 mcg by mouth daily.   Vitamin D , Ergocalciferol , (DRISDOL ) 1.25 MG (50000 UNIT) CAPS capsule TAKE ONE CAPSULE BY MOUTH ONCE WEEKLY ON TUESDAY   predniSONE  (DELTASONE ) 20 MG tablet Take 1 tablet (20 mg total) by mouth 2 (two) times daily with a meal. (Patient not taking: Reported on 07/21/2024)   [DISCONTINUED] insulin  lispro (HUMALOG ) 100 UNIT/ML KwikPen Inject 2-8 Units into the skin 3 (three) times daily. (Patient not taking: Reported on 07/21/2024)   No facility-administered encounter medications on file as of 07/21/2024.    ALLERGIES: Allergies  Allergen Reactions   Coconut Oil Anaphylaxis and Swelling    Mouth swelling   Ambien  [Zolpidem Tartrate] Other (See Comments)    Hallucinations/nightmares   Atorvastatin  Other (See Comments)    Leg cramps   Codeine Nausea And Vomiting   Onion Nausea Only and Other (See Comments)    Raw onions   Other Diarrhea and Other (See Comments)    Ground Beef (patient avoid due to side effect of diarrhea)   Rosuvastatin  Other (See Comments)    Myalgias    Bee Venom Swelling   Sulfa Antibiotics Rash and Other (See Comments)    Including topical sulfa  (mouth dries/peels/skin blisters)    VACCINATION STATUS: Immunization History  Administered Date(s) Administered   Fluad Quad(high Dose 65+) 12/03/2022   Pneumococcal Polysaccharide-23 08/22/2022   Tdap 05/29/2005, 05/22/2022   Zoster Recombinant(Shingrix ) 05/22/2022    Diabetes She presents for her follow-up diabetic visit. She has type 2 diabetes mellitus. Onset time: Diagnosed at approx age of 56. Her disease course has been stable. There are no hypoglycemic associated symptoms. Associated symptoms include foot paresthesias. Pertinent negatives for diabetes include no weight loss. There are no hypoglycemic complications. Symptoms are stable. There are no diabetic complications. Risk factors for coronary artery disease include diabetes mellitus, family history, obesity, sedentary lifestyle and post-menopausal. Current diabetic treatment includes oral agent (monotherapy) and insulin  injections. She is compliant with treatment most of the time. Her weight is decreasing steadily. She is following a generally healthy diet. Meal planning includes avoidance of concentrated sweets. She has not had a previous visit with a dietitian. She participates in exercise daily. Her home blood glucose trend is fluctuating minimally. Her breakfast blood glucose range is generally 110-130 mg/dl. (She presents today with her logs from  the ALF showing at target fasting glycemic profile.  Her POCT A1c today is 6.9%, increasing from last visit of 5.8% but  still good target for her.  She did have one incidence of low glucose at 52.  She was taken off the meal time insulin  by the facility.  She does note she has lots of diarrhea and multiple UTIs, has really bad perineal irritation.) An ACE inhibitor/angiotensin II receptor blocker is being taken. She sees a podiatrist.Eye exam is current.    Review of systems  Constitutional: + stable body weight,  current Body mass index is 27.6 kg/m. , no fatigue, no subjective hyperthermia, no subjective hypothermia Eyes: no blurry vision, no xerophthalmia ENT: no sore throat, no nodules palpated in throat, no dysphagia/odynophagia, no hoarseness Cardiovascular: no chest pain, no shortness of breath, no palpitations, no leg swelling Respiratory: no cough, no shortness of breath Gastrointestinal: no nausea/vomiting, + watery diarrhea Genitourinary:+ polyuria, +dysuria Musculoskeletal: no muscle/joint aches Skin: no rashes, no hyperemia Neurological: no tremors, + numbness/ tingling to bilateral hands, no dizziness Psychiatric: no depression, no anxiety  Objective:     BP 138/88 (BP Location: Right Arm, Patient Position: Sitting, Cuff Size: Large)   Pulse 80   Ht 5' 7 (1.702 m)   Wt 176 lb 3.2 oz (79.9 kg)   BMI 27.60 kg/m   Wt Readings from Last 3 Encounters:  07/21/24 176 lb 3.2 oz (79.9 kg)  06/22/24 172 lb 3.2 oz (78.1 kg)  06/20/24 172 lb 3.2 oz (78.1 kg)     BP Readings from Last 3 Encounters:  07/21/24 138/88  07/14/24 (!) 145/82  06/22/24 (!) 148/55      Physical Exam- Limited  Constitutional:  Body mass index is 27.6 kg/m. , not in acute distress, normal state of mind Eyes:  EOMI, no exophthalmos Musculoskeletal: no gross deformities, strength intact in all four extremities, no gross restriction of joint movements Skin:  no rashes, no hyperemia Neurological: no tremor with outstretched hands   Diabetic Foot Exam - Simple   No data filed     CMP ( most recent) CMP      Component Value Date/Time   NA 138 06/22/2024 1146   NA 139 11/11/2023 1028   K 3.8 06/22/2024 1146   CL 104 06/22/2024 1146   CO2 25 06/22/2024 1146   GLUCOSE 108 (H) 06/22/2024 1146   BUN 11 06/22/2024 1146   BUN 14 11/11/2023 1028   CREATININE 0.63 06/22/2024 1146   CALCIUM  8.6 (L) 06/22/2024 1146   PROT 6.6 06/22/2024 1146   PROT 6.1 07/08/2023 0914   ALBUMIN 3.3 (L) 06/22/2024 1146   ALBUMIN 3.9 07/08/2023 0914   AST 22 06/22/2024 1146   ALT 15 06/22/2024 1146   ALKPHOS 27 (L) 06/22/2024 1146   BILITOT 0.6 06/22/2024 1146   BILITOT 0.4 07/08/2023 0914   GFRNONAA >60 06/22/2024 1146   GFRAA >60 09/12/2019 0239     Diabetic Labs (most recent): Lab Results  Component Value Date   HGBA1C 6.9 (A) 07/21/2024   HGBA1C 5.8 (A) 03/17/2024   HGBA1C 5.8 (A) 11/18/2023     Lipid Panel ( most recent) Lipid Panel     Component Value Date/Time   CHOL 138 01/29/2024 1310   TRIG 82 01/29/2024 1310   HDL 53 01/29/2024 1310   CHOLHDL 2.6 01/29/2024 1310   LDLCALC 69 01/29/2024 1310   LABVLDL 16 01/29/2024 1310      Lab Results  Component Value Date   TSH  2.890 01/29/2024   TSH 0.313 (L) 07/08/2023   TSH 0.648 02/22/2023   TSH 0.317 (L) 08/22/2022   TSH 0.403 (L) 05/22/2022   FREET4 1.15 01/29/2024   FREET4 1.43 07/08/2023   FREET4 1.25 02/22/2023   FREET4 1.46 08/22/2022   FREET4 1.52 05/22/2022           Assessment & Plan:   1) Type 2 diabetes mellitus without complication, with long-term current use of insulin  (HCC)  She presents today with her logs from the ALF showing at target fasting glycemic profile.  Her POCT A1c today is 6.9%, increasing from last visit of 5.8% but still good target for her.  She did have one incidence of low glucose at 52.  She was taken off the meal time insulin  by the facility.  She does note she has lots of diarrhea and multiple UTIs, has really bad perineal irritation.  - [name] has currently uncontrolled symptomatic  type 2 DM since 73 years of age.   -Recent labs reviewed.  - I had a long discussion with her about the progressive nature of diabetes and the pathology behind its complications. -her diabetes is not currently complicated but she remains at a high risk for more acute and chronic complications which include CAD, CVA, CKD, retinopathy, and neuropathy. These are all discussed in detail with her.  The following Lifestyle Medicine recommendations according to American College of Lifestyle Medicine [facility]) were discussed and offered to patient and she agrees to start the journey:  A. Whole Foods, Plant-based plate comprising of fruits and vegetables, plant-based proteins, whole-grain carbohydrates was discussed in detail with the patient.   A list for source of those nutrients were also provided to the patient.  Patient will use only water  or unsweetened tea for hydration. B.  The need to stay away from risky substances including alcohol, smoking; obtaining 7 to 9 hours of restorative sleep, at least 150 minutes of moderate intensity exercise weekly, the importance of healthy social connections,  and stress reduction techniques were discussed. C.  A full color page of  Calorie density of various food groups per pound showing examples of each food groups was provided to the patient.  - Nutritional counseling repeated at each appointment due to patients tendency to fall back in to old habits.  - The patient admits there is a room for improvement in their diet and drink choices. -  Suggestion is made for the patient to avoid simple carbohydrates from their diet including Cakes, Sweet Desserts / Pastries, Ice Cream, Soda (diet and regular), Sweet Tea, Candies, Chips, Cookies, Sweet Pastries, Store Bought Juices, Alcohol in Excess of 1-2 drinks a day, Artificial Sweeteners, Coffee Creamer, and Sugar-free Products. This will help patient to have stable blood glucose profile and potentially avoid unintended  weight gain.   - I encouraged the patient to switch to unprocessed or minimally processed complex starch and increased protein intake (animal or plant source), fruits, and vegetables.   - Patient is advised to stick to a routine mealtimes to eat 3 meals a day and avoid unnecessary snacks (to snack only to correct hypoglycemia).  - I have approached her with the following individualized plan to manage her diabetes and patient agrees:   -She is advised to continue Lantus  20 units SQ nightly Rybelsus  14 mg po daily before breakfast.  May look to lower or discontinue insulin  at next visit.  -she is encouraged to continue monitoring glucose 4 times daily (using her CGM),  before meals and before bed, and to call the clinic if she has readings less than 70 or above 300 for 3 tests in a row.   - she is warned not to take insulin  without proper monitoring per orders. - Adjustment parameters are given to her for hypo and hyperglycemia in writing.  - she is not a candidate for sulfa meds due to allergy.  - Specific targets for  A1c; LDL, HDL, and Triglycerides were discussed with the patient.  2) Blood Pressure /Hypertension:  her blood pressure is controlled to target without the use of antihypertensive medications.  3) Lipids/Hyperlipidemia:    Review of her recent lipid panel from 01/29/24 showed controlled LDL at 69.  she is advised to continue Lipitor 20 mg daily at bedtime.  Side effects and precautions discussed with her.  4)  Weight/Diet:  her Body mass index is 27.6 kg/m.  -  clearly complicating her diabetes care.   she is a candidate for weight loss. I discussed with her the fact that loss of 5 - 10% of her  current body weight will have the most impact on her diabetes management.  Exercise, and detailed carbohydrates information provided  -  detailed on discharge instructions.  5) Hypothyroidism-unspecified -Currently managed by her PCP.  6) Vitamin D  deficiency Her recent vitamin D   level on 01/29/24 was 48.9.  Her PCP put her on Ergocalciferol  50000 units weekly.  7) Chronic Care/Health Maintenance: -she is not on ACEI/ARB and is on Statin medications and is encouraged to initiate and continue to follow up with Ophthalmology, Dentist, Podiatrist at least yearly or according to recommendations, and advised to stay away from smoking. I have recommended yearly flu vaccine and pneumonia vaccine at least every 5 years; moderate intensity exercise for up to 150 minutes weekly; and sleep for at least 7 hours a day.  - she is advised to maintain close follow up with Nguyen, Hong Thu T, NP for primary care needs, as well as her other providers for optimal and coordinated care.     I spent  38  minutes in the care of the patient today including review of labs from CMP, Lipids, Thyroid  Function, Hematology (current and previous including abstractions from other facilities); face-to-face time discussing  her blood glucose readings/logs, discussing hypoglycemia and hyperglycemia episodes and symptoms, medications doses, her options of short and long term treatment based on the latest standards of care / guidelines;  discussion about incorporating lifestyle medicine;  and documenting the encounter. Risk reduction counseling performed per USPSTF guidelines to reduce obesity and cardiovascular risk factors.     Please refer to Patient Instructions for Blood Glucose Monitoring and Insulin /Medications Dosing Guide  in media tab for additional information. Please  also refer to  Patient Self Inventory in the Media  tab for reviewed elements of pertinent patient history.  [name] participated in the discussions, expressed understanding, and voiced agreement with the above plans.  All questions were answered to her satisfaction. she is encouraged to contact clinic should she have any questions or concerns prior to her return visit.     Follow up plan: - Return in about 4 months  (around 11/21/2024) for Diabetes F/U with A1c in office, No previsit labs, Bring meter and logs.  Benton Rio, [facility] [address] [place], [place] [address] Phone: [phone] Fax: [phone]  07/21/2024, 11:05 AM

## 2024-07-27 DIAGNOSIS — E1169 Type 2 diabetes mellitus with other specified complication: Secondary | ICD-10-CM | POA: Diagnosis not present

## 2024-07-27 DIAGNOSIS — E782 Mixed hyperlipidemia: Secondary | ICD-10-CM | POA: Diagnosis not present

## 2024-07-27 DIAGNOSIS — F0390 Unspecified dementia without behavioral disturbance: Secondary | ICD-10-CM | POA: Diagnosis not present

## 2024-08-05 DIAGNOSIS — F411 Generalized anxiety disorder: Secondary | ICD-10-CM | POA: Diagnosis not present

## 2024-08-05 DIAGNOSIS — F331 Major depressive disorder, recurrent, moderate: Secondary | ICD-10-CM | POA: Diagnosis not present

## 2024-08-05 DIAGNOSIS — F515 Nightmare disorder: Secondary | ICD-10-CM | POA: Diagnosis not present

## 2024-08-05 DIAGNOSIS — F0390 Unspecified dementia without behavioral disturbance: Secondary | ICD-10-CM | POA: Diagnosis not present

## 2024-08-06 DIAGNOSIS — F0390 Unspecified dementia without behavioral disturbance: Secondary | ICD-10-CM | POA: Diagnosis not present

## 2024-08-06 DIAGNOSIS — F411 Generalized anxiety disorder: Secondary | ICD-10-CM | POA: Diagnosis not present

## 2024-08-13 DIAGNOSIS — N3941 Urge incontinence: Secondary | ICD-10-CM | POA: Diagnosis not present

## 2024-08-14 DIAGNOSIS — E119 Type 2 diabetes mellitus without complications: Secondary | ICD-10-CM | POA: Diagnosis not present

## 2024-08-14 DIAGNOSIS — M810 Age-related osteoporosis without current pathological fracture: Secondary | ICD-10-CM | POA: Diagnosis not present

## 2024-08-17 DIAGNOSIS — E039 Hypothyroidism, unspecified: Secondary | ICD-10-CM | POA: Diagnosis not present

## 2024-08-17 DIAGNOSIS — K219 Gastro-esophageal reflux disease without esophagitis: Secondary | ICD-10-CM | POA: Diagnosis not present

## 2024-08-17 DIAGNOSIS — M810 Age-related osteoporosis without current pathological fracture: Secondary | ICD-10-CM | POA: Diagnosis not present

## 2024-08-18 ENCOUNTER — Ambulatory Visit (INDEPENDENT_AMBULATORY_CARE_PROVIDER_SITE_OTHER): Admitting: Gastroenterology

## 2024-08-18 ENCOUNTER — Encounter: Payer: Self-pay | Admitting: Gastroenterology

## 2024-08-18 VITALS — BP 157/73 | HR 70 | Temp 97.9°F | Ht 67.5 in | Wt 183.2 lb

## 2024-08-18 DIAGNOSIS — K529 Noninfective gastroenteritis and colitis, unspecified: Secondary | ICD-10-CM

## 2024-08-18 DIAGNOSIS — K921 Melena: Secondary | ICD-10-CM

## 2024-08-18 DIAGNOSIS — R197 Diarrhea, unspecified: Secondary | ICD-10-CM | POA: Insufficient documentation

## 2024-08-18 NOTE — Progress Notes (Signed)
 GI Office Note    Referring Provider: Leontine Phebe Charlotte ONEIDA, NP Primary Care Physician:  Leontine Phebe Charlotte ONEIDA, NP  Primary Gastroenterologist: Carlin POUR. Cindie, DO   Chief Complaint   Chief Complaint  Patient presents with   Diarrhea    History of Present Illness   [name] is a 73 y.o. female presenting today for follow up of diarrhea. She was last seen in 10/2023.  She has a history of chronic loose stools and black stools we initially evaluated her for in July 2024.  Discussed the use of AI scribe software for clinical note transcription with the patient, who gave verbal consent to proceed.    She experiences diarrhea that has worsened significantly, occurring three to four times daily and described as 'like soup'. The diarrhea is very dark, almost black, resembling gravy, and she is unsure how long it has been this color. No visible blood is noted in the stool. She experiences incontinence, sometimes waking up to find she has had a bowel movement in her sleep. Incontinence when she stands up. Has used Imodium intermittently without effective resolution of symptoms. She is not sure what she is taking currently. We have requested copy of medications from the facility.  She has a history of food allergies, including an inability to eat beef, raw onions, and salads containing lettuce, which lead to diarrhea. No rashes or swelling occur with these foods. She also experiences frequent burping and gas, described as severe enough to 'explode one of your toilets'.  She has not had any recent stool tests since moving to her current location in April. About 3 years ago she weighed about 40 pounds more. In the past 8 months she has gained 15 pounds.      Wt Readings from Last 60 Encounters:  08/18/24 183 lb 3.2 oz (83.1 kg)  07/21/24 176 lb 3.2 oz (79.9 kg)  06/22/24 172 lb 3.2 oz (78.1 kg)  06/20/24 172 lb 3.2 oz (78.1 kg)  03/17/24 168 lb 6.4 oz (76.4 kg)  02/28/24 164 lb  12.8 oz (74.8 kg)  02/13/24 166 lb (75.3 kg)  01/08/24 169 lb (76.7 kg)  01/07/24 167 lb 1.9 oz (75.8 kg)    Prior Data    Celiac panel negative in 06/2023.   Labs from July 2024: Hemoglobin 11.5, iron 42, TIBC 361, iron sat 12%, ferritin 21.  Labs from June 2025: Hemoglobin 11.4, hematocrit 36, MCV 91.6  CT A/P with contrast 11/2023: -splenomegaly  CT abdomen pelvis with contrast June 2020: IMPRESSION: 1. Noncalcified gallstone versus gallbladder sludge. No evidence of acute cholecystitis. 2. Sigmoid diverticulosis without diverticulitis. 3. Stable borderline splenomegaly. 4.  Aortic Atherosclerosis (ICD10-I70.0).   EGD 08/06/2023 normal esophagus, mild gastritis, biopsies negative for H. pylori.   Colonoscopy 08/06/2023 with internal hemorrhoids, sigmoid diverticulosis, 2 polyps removed, normal terminal ileum.  Polyps tubular adenomas, 5-year recall.  Biopsies negative for microscopic colitis.     Medications   Current Outpatient Medications  Medication Sig Dispense Refill   acyclovir  (ZOVIRAX ) 400 MG tablet TAKE 1 TABLET BY MOUTH EVERY TWELVE HOURS (AT BREAKFAST AND AT BEDTIME) 90 tablet 1   alendronate  (FOSAMAX ) 70 MG tablet TAKE 1 TABLET BY MOUTH ONCE A WEEK, TAKE with full GLASS of water  ON an EMPTY stomach 4 tablet 11   Apple Cider Vinegar 300 MG TABS Take by mouth.     aspirin  81 MG chewable tablet Chew 1 tablet (81 mg total) by mouth 2 (two)  times daily. 60 tablet 0   atorvastatin  (LIPITOR) 20 MG tablet TAKE 1 TABLET BY MOUTH AT BEDTIME 90 tablet 1   Blood Pressure Monitoring (BLOOD PRESSURE MONITOR/L CUFF) MISC Dx: R03.0, elevated bp in office 1 each 0   cholecalciferol (VITAMIN D3) 25 MCG (1000 UNIT) tablet Take 1,000 Units by mouth daily.     CLARITIN 10 MG tablet Take 10 mg by mouth daily.     Continuous Glucose Receiver (FREESTYLE LIBRE 2 READER) DEVI      Continuous Glucose Sensor (DEXCOM G7 SENSOR) MISC USE AS DIRECTED TO test BLOOD SUGAR FOUR TIMES DAILY.  CHANGE sensor EVERY 10 DAYS 3 each 2   donepezil  (ARICEPT ) 10 MG tablet TAKE 1 TABLET BY MOUTH EVERY DAY 90 tablet 1   EPINEPHrine  0.3 mg/0.3 mL IJ SOAJ injection Inject 0.3 mg into the muscle as needed for anaphylaxis. 1 each 2   escitalopram  (LEXAPRO ) 20 MG tablet TAKE 1 TABLET BY MOUTH EVERY DAY 30 tablet 2   esomeprazole (NEXIUM) 40 MG capsule Take 40 mg by mouth 2 (two) times daily.     famotidine (PEPCID) 40 MG tablet Take 40 mg by mouth daily.     glucose blood (TRUE METRIX BLOOD GLUCOSE TEST) test strip USE AS DIRECTED 100 each 2   insulin  glargine (LANTUS ) 100 UNIT/ML Solostar Pen Inject 20 Units into the skin at bedtime. 18 mL 3   Insulin  Pen Needle (BD AUTOSHIELD DUO) 30G X 5 MM MISC USE AS DIRECTED 100 each 2   levothyroxine  (SYNTHROID ) 88 MCG tablet TAKE 1 TABLET BY MOUTH ONCE A DAY BEFORE BREAKFAST 90 tablet 1   loperamide (IMODIUM) 2 MG capsule Take 2 mg by mouth See admin instructions. Take 2 mg PO after each loose stool as needed for diarrhea (max of 3 doses in 24 hours)     meclizine  (ANTIVERT ) 12.5 MG tablet Take 1 tablet (12.5 mg total) by mouth 3 (three) times daily as needed for dizziness. 30 tablet 0   methocarbamol  (ROBAXIN ) 500 MG tablet Take 1 tablet (500 mg total) by mouth 2 (two) times daily. 30 tablet 0   mirabegron  ER (MYRBETRIQ ) 25 MG TB24 tablet Take 1 tablet (25 mg total) by mouth daily. 90 tablet 3   Multiple Vitamins-Minerals (HAIR SKIN & NAILS ADVANCED) TABS Take 1 tablet by mouth daily.     nystatin  cream (MYCOSTATIN ) apply TO SKIN folds, UNDER breasts, abdominal folds, AND inguinal folds TWICE DAILY FOR candidasis 30 g 1   ondansetron  (ZOFRAN -ODT) 4 MG disintegrating tablet Take 1 tablet (4 mg total) by mouth every 8 (eight) hours as needed. 20 tablet 0   pantoprazole  (PROTONIX ) 40 MG tablet Take 1 tablet (40 mg total) by mouth 2 (two) times daily. 90 tablet 1   prazosin  (MINIPRESS ) 2 MG capsule TAKE ONE CAPSULE BY MOUTH AT BEDTIME 90 capsule 1   Semaglutide   (RYBELSUS ) 14 MG TABS Take 1 tablet (14 mg total) by mouth daily. 90 tablet 3   Specialty Vitamins Products (COLLAGEN ULTRA PO) Take by mouth.     sucralfate (CARAFATE) 1 g tablet Take 1 g by mouth 4 (four) times daily.     vitamin B-12 (CYANOCOBALAMIN ) 1000 MCG tablet Take 1,000 mcg by mouth daily.     Vitamin D , Ergocalciferol , (DRISDOL ) 1.25 MG (50000 UNIT) CAPS capsule TAKE ONE CAPSULE BY MOUTH ONCE WEEKLY ON TUESDAY 10 capsule 1   No current facility-administered medications for this visit.    Allergies   Allergies as of 08/18/2024 -  Review Complete 08/18/2024  Allergen Reaction Noted   Coconut oil Anaphylaxis and Swelling 09/06/2015   Ambien [zolpidem tartrate] Other (See Comments) 09/01/2019   Atorvastatin  Other (See Comments) 09/06/2015   Codeine Nausea And Vomiting 09/01/2019   Onion Nausea Only and Other (See Comments) 09/01/2019   Other Diarrhea and Other (See Comments) 09/01/2019   Rosuvastatin  Other (See Comments) 09/06/2015   Bee venom Swelling 05/22/2022   Sulfa antibiotics Rash and Other (See Comments) 09/01/2019     Review of Systems   General: Negative for anorexia, weight loss, fever, chills, fatigue, weakness. ENT: Negative for hoarseness, difficulty swallowing , nasal congestion. CV: Negative for chest pain, angina, palpitations, dyspnea on exertion, peripheral edema.  Respiratory: Negative for dyspnea at rest, dyspnea on exertion, cough, sputum, wheezing.  GI: See history of present illness. GU:  Negative for dysuria, hematuria, urinary incontinence, urinary frequency, nocturnal urination.  Endo: Negative for unusual weight change.     Physical Exam   BP (!) 157/73 (BP Location: Right Arm, Patient Position: Sitting, Cuff Size: Normal)   Pulse 70   Temp 97.9 F (36.6 C) (Oral)   Ht 5' 7.5 (1.715 m)   Wt 183 lb 3.2 oz (83.1 kg)   SpO2 96%   BMI 28.27 kg/m    General: Well-nourished, well-developed in no acute distress.  Eyes: No icterus. Mouth:  Oropharyngeal mucosa moist and pink   Abdomen: Bowel sounds are normal, nontender, nondistended, no hepatosplenomegaly or masses,  no abdominal bruits or hernia , no rebound or guarding.  Rectal: not performed Extremities: No lower extremity edema. No clubbing or deformities. Neuro: Alert and oriented x 4   Skin: Warm and dry, no jaundice.   Psych: Alert and cooperative, normal mood and affect.  Labs   Lab Results  Component Value Date   HGBA1C 6.9 (A) 07/21/2024   Lab Results  Component Value Date   WBC 4.7 06/22/2024   HGB 11.4 (L) 06/22/2024   HCT 36.0 06/22/2024   MCV 91.6 06/22/2024   PLT 163 06/22/2024   Lab Results  Component Value Date   LIPASE 32 06/22/2024   Lab Results  Component Value Date   ALT 15 06/22/2024   AST 22 06/22/2024   ALKPHOS 27 (L) 06/22/2024   BILITOT 0.6 06/22/2024   Lab Results  Component Value Date   NA 138 06/22/2024   CL 104 06/22/2024   K 3.8 06/22/2024   CO2 25 06/22/2024   BUN 11 06/22/2024   CREATININE 0.63 06/22/2024   GFRNONAA >60 06/22/2024   CALCIUM  8.6 (L) 06/22/2024   ALBUMIN 3.3 (L) 06/22/2024   GLUCOSE 108 (H) 06/22/2024   Lab Results  Component Value Date   HGBA1C 6.9 (A) 07/21/2024   Lab Results  Component Value Date   TSH 2.890 01/29/2024    Imaging Studies   No results found.  Assessment/Plan:    Chronic Diarrhea/?melena: Worsening liquid diarrhea occurring three to four times daily with episodes of incontinence. Stool is very dark, almost black. She denies pepto bismol or iron. She has reported black stools in the past lasting for months at a time, not likely true melena. Colonoscopy with random colon biopsies last year was negative for microscopic colitis. Negative celiac serologies. Egd with gastritis but no h.pylori. - stool for GI profile, Cdiff, fecal elastase, fecal calprotectin -cbc, sed rate, crp. - need current medication list before changes can be considered.        Sonny RAMAN. Ezzard, MHS,  PA-C [facility]

## 2024-08-18 NOTE — Patient Instructions (Signed)
 We have ordered stool and blood test to evaluate your black stools and diarrhea.   Once we have results back and have a current copy of your medications, we will make further recommendations.  PLEASE FAX A COPY OF CURRENT MEDICATION LIST TO US  AT [phone]

## 2024-08-28 DIAGNOSIS — E782 Mixed hyperlipidemia: Secondary | ICD-10-CM | POA: Diagnosis not present

## 2024-08-28 DIAGNOSIS — I10 Essential (primary) hypertension: Secondary | ICD-10-CM | POA: Diagnosis not present

## 2024-08-28 DIAGNOSIS — E039 Hypothyroidism, unspecified: Secondary | ICD-10-CM | POA: Diagnosis not present

## 2024-09-03 DIAGNOSIS — F411 Generalized anxiety disorder: Secondary | ICD-10-CM | POA: Diagnosis not present

## 2024-09-03 DIAGNOSIS — F0390 Unspecified dementia without behavioral disturbance: Secondary | ICD-10-CM | POA: Diagnosis not present

## 2024-09-03 DIAGNOSIS — F331 Major depressive disorder, recurrent, moderate: Secondary | ICD-10-CM | POA: Diagnosis not present

## 2024-09-03 DIAGNOSIS — F515 Nightmare disorder: Secondary | ICD-10-CM | POA: Diagnosis not present

## 2024-09-07 DIAGNOSIS — E039 Hypothyroidism, unspecified: Secondary | ICD-10-CM | POA: Diagnosis not present

## 2024-09-07 NOTE — Progress Notes (Unsigned)
 History of Present Illness: [name] is a 73 y.o. year old female here for f/u of OAB w/ UUI as well as vaginal atrophic changes.  She had recent urodynamics at [facility].  She carried a fairly small residual urine volume. Bladder capacity was 315 mL.  She did have instability with 2 unprovoked unstable contractions.  There was no leakage.  She voided 65 mL, maximum flow was 10 mL or second, detrusor pressure at peak flow was 5 cm of water , maximum detrusor pressure was 9 cm of water .  She comes in today by herself.  She is still wearing lots of pull-ups as well as female incontinence pads inside of those.  She is on Myrbetriq  but not trospium .    Past Medical History:  Diagnosis Date   Anemia    as a teenager   Anxiety    Arthritis    knees, rt hand,hips   Cancer (HCC)    vaginal,uterine,ovariam   COPD (chronic obstructive pulmonary disease) (HCC)    Depression    Diabetes mellitus without complication (HCC)    Hypothyroidism    Neuromuscular disorder (HCC)    hands and feet    Past Surgical History:  Procedure Laterality Date   ABDOMINAL HYSTERECTOMY     BIOPSY  08/06/2023   Procedure: BIOPSY;  Surgeon: Cindie Carlin POUR, DO;  Location: [facility];  Service: Endoscopy;;   BREAST SURGERY Right 1988   lumpectomy   COLONOSCOPY WITH PROPOFOL  N/A 08/06/2023   Procedure: COLONOSCOPY WITH PROPOFOL ;  Surgeon: Cindie Carlin POUR, DO;  Location: [facility];  Service: Endoscopy;  Laterality: N/A;  900am, asa 2   ESOPHAGOGASTRODUODENOSCOPY (EGD) WITH PROPOFOL  N/A 08/06/2023   Procedure: ESOPHAGOGASTRODUODENOSCOPY (EGD) WITH PROPOFOL ;  Surgeon: Cindie Carlin POUR, DO;  Location: [facility];  Service: Endoscopy;  Laterality: N/A;   EYE SURGERY Bilateral    FRACTURE SURGERY     Rt ankle,lt knee cap,lt shoulder,rt wrist,rt shoulder,   POLYPECTOMY  08/06/2023   Procedure: POLYPECTOMY;  Surgeon: Cindie Carlin POUR, DO;  Location: [facility];  Service: Endoscopy;;    TONSILLECTOMY     at age 60   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   Procedure: TOTAL KNEE ARTHROPLASTY;  Surgeon: Shari Sieving, MD;  Location: [facility];  Service: Orthopedics;  Laterality: Right;    Home Medications:  (Not in a hospital admission)   Allergies:  Allergies  Allergen Reactions   Coconut Oil Anaphylaxis and Swelling    Mouth swelling   Ambien [Zolpidem Tartrate] Other (See Comments)    Hallucinations/nightmares   Atorvastatin  Other (See Comments)    Leg cramps   Codeine Nausea And Vomiting   Onion Nausea Only and Other (See Comments)    Raw onions   Other Diarrhea and Other (See Comments)    Ground Beef (patient avoid due to side effect of diarrhea)   Rosuvastatin  Other (See Comments)    Myalgias    Bee Venom Swelling   Sulfa Antibiotics Rash and Other (See Comments)    Including topical sulfa  (mouth dries/peels/skin blisters)    Family History  Problem Relation Age of Onset   Cancer Mother    Hypertension Mother    Thyroid  disease Mother    Diabetes Mother    Stroke Mother    Heart failure Mother    Diabetes Father    Heart attack Father    Diabetes Sister    Diabetes Brother     Social History:  reports  that she quit smoking about 12 years ago. Her smoking use included cigarettes. She started smoking about 27 years ago. She has a 7.5 pack-year smoking history. She has never used smokeless tobacco. She reports that she does not drink alcohol and does not use drugs.  ROS: A complete review of systems was performed.  All systems are negative except for pertinent findings as noted.    I have reviewed prior pt notes--[facility] as well as [facility]  I have reviewed urodynamics  I have reviewed prior urinalysis results   Impression/Assessment:  Overactive bladder-wet.  She has normal bladder capacity but instability of her bladder despite being on the Myrbetriq .  She is not on Trost BM which has been used in the past with the Myrbetriq .   She has failed PTNS and basically every other therapy.  Plan:  -She should be on trospium  20 mg twice a day in addition to the Myrbetriq   -Continue follow-up here every 6 months  Garnette CHRISTELLA Shack 09/07/2024, 7:18 AM  Garnette CHRISTELLA. Angelica Liles MD

## 2024-09-08 ENCOUNTER — Ambulatory Visit (INDEPENDENT_AMBULATORY_CARE_PROVIDER_SITE_OTHER): Admitting: Urology

## 2024-09-08 ENCOUNTER — Encounter: Payer: Self-pay | Admitting: Urology

## 2024-09-08 VITALS — BP 106/68 | HR 75

## 2024-09-08 DIAGNOSIS — R339 Retention of urine, unspecified: Secondary | ICD-10-CM

## 2024-09-08 DIAGNOSIS — N3281 Overactive bladder: Secondary | ICD-10-CM

## 2024-09-08 DIAGNOSIS — N3941 Urge incontinence: Secondary | ICD-10-CM | POA: Diagnosis not present

## 2024-09-08 DIAGNOSIS — N952 Postmenopausal atrophic vaginitis: Secondary | ICD-10-CM

## 2024-09-08 NOTE — Progress Notes (Signed)
 Bladder Scan completed today.  Patient cannot void prior to the bladder scan. Bladder scan result: 1  Performed By: Exie DASEN. CMA  Additional notes-

## 2024-09-18 DIAGNOSIS — M810 Age-related osteoporosis without current pathological fracture: Secondary | ICD-10-CM | POA: Diagnosis not present

## 2024-09-18 DIAGNOSIS — E119 Type 2 diabetes mellitus without complications: Secondary | ICD-10-CM | POA: Diagnosis not present

## 2024-09-21 DIAGNOSIS — E1169 Type 2 diabetes mellitus with other specified complication: Secondary | ICD-10-CM | POA: Diagnosis not present

## 2024-09-21 DIAGNOSIS — E782 Mixed hyperlipidemia: Secondary | ICD-10-CM | POA: Diagnosis not present

## 2024-09-21 DIAGNOSIS — R54 Age-related physical debility: Secondary | ICD-10-CM | POA: Diagnosis not present

## 2024-09-28 DIAGNOSIS — I951 Orthostatic hypotension: Secondary | ICD-10-CM | POA: Diagnosis not present

## 2024-09-28 DIAGNOSIS — R42 Dizziness and giddiness: Secondary | ICD-10-CM | POA: Diagnosis not present

## 2024-10-01 DIAGNOSIS — F0284 Dementia in other diseases classified elsewhere, unspecified severity, with anxiety: Secondary | ICD-10-CM | POA: Diagnosis not present

## 2024-10-01 DIAGNOSIS — F03A3 Unspecified dementia, mild, with mood disturbance: Secondary | ICD-10-CM | POA: Diagnosis not present

## 2024-10-01 DIAGNOSIS — N182 Chronic kidney disease, stage 2 (mild): Secondary | ICD-10-CM | POA: Diagnosis not present

## 2024-10-01 DIAGNOSIS — E785 Hyperlipidemia, unspecified: Secondary | ICD-10-CM | POA: Diagnosis not present

## 2024-10-01 DIAGNOSIS — F02818 Dementia in other diseases classified elsewhere, unspecified severity, with other behavioral disturbance: Secondary | ICD-10-CM | POA: Diagnosis not present

## 2024-10-01 DIAGNOSIS — E1122 Type 2 diabetes mellitus with diabetic chronic kidney disease: Secondary | ICD-10-CM | POA: Diagnosis not present

## 2024-10-05 DIAGNOSIS — I951 Orthostatic hypotension: Secondary | ICD-10-CM | POA: Diagnosis not present

## 2024-10-05 DIAGNOSIS — E739 Lactose intolerance, unspecified: Secondary | ICD-10-CM | POA: Diagnosis not present

## 2024-10-05 DIAGNOSIS — R42 Dizziness and giddiness: Secondary | ICD-10-CM | POA: Diagnosis not present

## 2024-10-06 DIAGNOSIS — R92313 Mammographic fatty tissue density, bilateral breasts: Secondary | ICD-10-CM | POA: Diagnosis not present

## 2024-10-06 DIAGNOSIS — Z1231 Encounter for screening mammogram for malignant neoplasm of breast: Secondary | ICD-10-CM | POA: Diagnosis not present

## 2024-10-08 DIAGNOSIS — F0390 Unspecified dementia without behavioral disturbance: Secondary | ICD-10-CM | POA: Diagnosis not present

## 2024-10-08 DIAGNOSIS — F411 Generalized anxiety disorder: Secondary | ICD-10-CM | POA: Diagnosis not present

## 2024-10-14 DIAGNOSIS — R32 Unspecified urinary incontinence: Secondary | ICD-10-CM | POA: Diagnosis not present

## 2024-10-14 DIAGNOSIS — Z87440 Personal history of urinary (tract) infections: Secondary | ICD-10-CM | POA: Diagnosis not present

## 2024-10-14 DIAGNOSIS — R3915 Urgency of urination: Secondary | ICD-10-CM | POA: Diagnosis not present

## 2024-10-19 DIAGNOSIS — N3000 Acute cystitis without hematuria: Secondary | ICD-10-CM | POA: Diagnosis not present

## 2024-10-19 DIAGNOSIS — E039 Hypothyroidism, unspecified: Secondary | ICD-10-CM | POA: Diagnosis not present

## 2024-10-19 DIAGNOSIS — K219 Gastro-esophageal reflux disease without esophagitis: Secondary | ICD-10-CM | POA: Diagnosis not present

## 2024-10-20 ENCOUNTER — Other Ambulatory Visit: Payer: Self-pay

## 2024-10-20 ENCOUNTER — Encounter (HOSPITAL_COMMUNITY): Payer: Self-pay | Admitting: Emergency Medicine

## 2024-10-20 ENCOUNTER — Emergency Department (HOSPITAL_COMMUNITY)

## 2024-10-20 ENCOUNTER — Emergency Department (HOSPITAL_COMMUNITY)
Admission: EM | Admit: 2024-10-20 | Discharge: 2024-10-20 | Disposition: A | Discharge status: Home / Self Care | Attending: Emergency Medicine | Admitting: Emergency Medicine

## 2024-10-20 DIAGNOSIS — Z7982 Long term (current) use of aspirin: Secondary | ICD-10-CM | POA: Insufficient documentation

## 2024-10-20 DIAGNOSIS — R066 Hiccough: Secondary | ICD-10-CM | POA: Diagnosis not present

## 2024-10-20 DIAGNOSIS — K2970 Gastritis, unspecified, without bleeding: Secondary | ICD-10-CM | POA: Diagnosis not present

## 2024-10-20 DIAGNOSIS — Z794 Long term (current) use of insulin: Secondary | ICD-10-CM | POA: Insufficient documentation

## 2024-10-20 DIAGNOSIS — R112 Nausea with vomiting, unspecified: Secondary | ICD-10-CM | POA: Diagnosis not present

## 2024-10-20 DIAGNOSIS — K30 Functional dyspepsia: Secondary | ICD-10-CM | POA: Diagnosis not present

## 2024-10-20 DIAGNOSIS — I700 Atherosclerosis of aorta: Secondary | ICD-10-CM | POA: Diagnosis not present

## 2024-10-20 DIAGNOSIS — R1111 Vomiting without nausea: Secondary | ICD-10-CM | POA: Diagnosis not present

## 2024-10-20 DIAGNOSIS — K828 Other specified diseases of gallbladder: Secondary | ICD-10-CM | POA: Diagnosis not present

## 2024-10-20 DIAGNOSIS — K5750 Diverticulosis of both small and large intestine without perforation or abscess without bleeding: Secondary | ICD-10-CM | POA: Diagnosis not present

## 2024-10-20 DIAGNOSIS — R109 Unspecified abdominal pain: Secondary | ICD-10-CM | POA: Diagnosis not present

## 2024-10-20 DIAGNOSIS — K449 Diaphragmatic hernia without obstruction or gangrene: Secondary | ICD-10-CM | POA: Diagnosis not present

## 2024-10-20 DIAGNOSIS — R9431 Abnormal electrocardiogram [ECG] [EKG]: Secondary | ICD-10-CM | POA: Diagnosis not present

## 2024-10-20 DIAGNOSIS — R197 Diarrhea, unspecified: Secondary | ICD-10-CM | POA: Diagnosis not present

## 2024-10-20 DIAGNOSIS — N281 Cyst of kidney, acquired: Secondary | ICD-10-CM | POA: Diagnosis not present

## 2024-10-20 DIAGNOSIS — R079 Chest pain, unspecified: Secondary | ICD-10-CM | POA: Diagnosis not present

## 2024-10-20 LAB — CBC WITH DIFFERENTIAL/PLATELET
Abs Immature Granulocytes: 0.01 K/uL (ref 0.00–0.07)
Basophils Absolute: 0 K/uL (ref 0.0–0.1)
Basophils Relative: 1 %
Eosinophils Absolute: 0 K/uL (ref 0.0–0.5)
Eosinophils Relative: 0 %
HCT: 35.4 % — ABNORMAL LOW (ref 36.0–46.0)
Hemoglobin: 11.4 g/dL — ABNORMAL LOW (ref 12.0–15.0)
Immature Granulocytes: 0 %
Lymphocytes Relative: 14 %
Lymphs Abs: 0.9 K/uL (ref 0.7–4.0)
MCH: 29.3 pg (ref 26.0–34.0)
MCHC: 32.2 g/dL (ref 30.0–36.0)
MCV: 91 fL (ref 80.0–100.0)
Monocytes Absolute: 0.6 K/uL (ref 0.1–1.0)
Monocytes Relative: 9 %
Neutro Abs: 5.1 K/uL (ref 1.7–7.7)
Neutrophils Relative %: 76 %
Platelets: 161 K/uL (ref 150–400)
RBC: 3.89 MIL/uL (ref 3.87–5.11)
RDW: 13.2 % (ref 11.5–15.5)
WBC: 6.6 K/uL (ref 4.0–10.5)
nRBC: 0 % (ref 0.0–0.2)

## 2024-10-20 LAB — COMPREHENSIVE METABOLIC PANEL WITH GFR
ALT: 28 U/L (ref 0–44)
AST: 36 U/L (ref 15–41)
Albumin: 3.7 g/dL (ref 3.5–5.0)
Alkaline Phosphatase: 36 U/L — ABNORMAL LOW (ref 38–126)
Anion gap: 11 (ref 5–15)
BUN: 11 mg/dL (ref 8–23)
CO2: 27 mmol/L (ref 22–32)
Calcium: 8.9 mg/dL (ref 8.9–10.3)
Chloride: 102 mmol/L (ref 98–111)
Creatinine, Ser: 0.72 mg/dL (ref 0.44–1.00)
GFR, Estimated: >60 mL/min (ref >60)
Glucose, Bld: 144 mg/dL — ABNORMAL HIGH (ref 70–99)
Potassium: 3.9 mmol/L (ref 3.5–5.1)
Sodium: 139 mmol/L (ref 135–145)
Total Bilirubin: 0.6 mg/dL (ref 0.0–1.2)
Total Protein: 6.6 g/dL (ref 6.5–8.1)

## 2024-10-20 LAB — LIPASE, BLOOD: Lipase: 18 U/L (ref 11–51)

## 2024-10-20 LAB — TROPONIN T, HIGH SENSITIVITY
Troponin T High Sensitivity: <15 ng/L (ref 0–19)
Troponin T High Sensitivity: <15 ng/L (ref 0–19)

## 2024-10-20 MED ORDER — PANTOPRAZOLE SODIUM 40 MG IV SOLR
40.0000 mg | Freq: Once | INTRAVENOUS | Status: AC
  Administered 2024-10-20: 40 mg via INTRAVENOUS
  Filled 2024-10-20: qty 10

## 2024-10-20 MED ORDER — IOHEXOL 350 MG/ML SOLN: 75.0000 mL | Freq: Once | INTRAVENOUS | Status: DC | PRN

## 2024-10-20 MED ORDER — IOHEXOL 300 MG/ML  SOLN
100.0000 mL | Freq: Once | INTRAMUSCULAR | Status: AC | PRN
  Administered 2024-10-20: 100 mL via INTRAVENOUS

## 2024-10-20 MED ORDER — SODIUM CHLORIDE 0.9 % IV BOLUS
1000.0000 mL | Freq: Once | INTRAVENOUS | Status: AC
  Administered 2024-10-20: 1000 mL via INTRAVENOUS

## 2024-10-20 MED ORDER — ONDANSETRON HCL 4 MG/2ML IJ SOLN
4.0000 mg | Freq: Once | INTRAMUSCULAR | Status: AC
  Administered 2024-10-20: 4 mg via INTRAVENOUS
  Filled 2024-10-20: qty 2

## 2024-10-20 NOTE — ED Notes (Signed)
 Patient transported to CT

## 2024-10-20 NOTE — Discharge Instructions (Signed)
 You were seen in the emergency department for belching and chest pain nausea vomiting diarrhea.  You had lab work and heart testing, CAT scan of your abdomen and pelvis that did not show an obvious explanation for your symptoms.  Please continue your acid medication and nausea medication.  Follow-up with your GI team.  Clear liquid diet advance as tolerated.  Return if any worsening or concerning symptoms

## 2024-10-20 NOTE — ED Triage Notes (Signed)
 Pt c/o n/v/d that started this am with indigestion.

## 2024-10-20 NOTE — ED Notes (Signed)
 Pt tolerated PO challenge, [facility] called to Transport pt back to [facility].

## 2024-10-20 NOTE — ED Notes (Signed)
 ED Provider at bedside.

## 2024-10-20 NOTE — ED Notes (Addendum)
 Pt ambulated to bathroom x standby assist, pt tolerated well. Pt given pads.

## 2024-10-20 NOTE — ED Provider Notes (Signed)
 [place] [facility] AT [facility] Provider Note   CSN: [id number] Arrival date & time: 10/20/24  1858     Patient presents with: Emesis   [name] is a 73 y.o. female.  She is here with complaint of pain in her chest and belching that is been going on all day.  She said after breakfast she had copious vomiting and diarrhea associated with some abdominal pain.  Since then she has been belching.  Has not wanted to eat or drink anything since.  No fevers or chills.  {Add pertinent medical, surgical, social history, OB history to YEP:[id number]} The history is provided by the patient.  Emesis Severity:  Moderate Chronicity:  New Associated symptoms: abdominal pain and diarrhea   Associated symptoms: no chills, no cough and no fever   Associated symptoms comment:  Chest pain and belching      Prior to Admission medications   Medication Sig Start Date End Date Taking? Authorizing Provider  acyclovir  (ZOVIRAX ) 400 MG tablet TAKE 1 TABLET BY MOUTH EVERY TWELVE HOURS (AT BREAKFAST AND AT BEDTIME) Patient not taking: Reported on 09/08/2024 02/03/24   Edman Meade PEDLAR, FNP  alendronate  (FOSAMAX ) 70 MG tablet TAKE 1 TABLET BY MOUTH ONCE A WEEK, TAKE with full GLASS of water  ON an EMPTY stomach 10/04/23   Bacchus, Meade PEDLAR, FNP  Apple Cider Vinegar 300 MG TABS Take by mouth.    [provider]  aspirin  81 MG chewable tablet Chew 1 tablet (81 mg total) by mouth 2 (two) times daily. Patient not taking: Reported on 09/08/2024 09/14/19   Chadwell, Joshua, PA-C  atorvastatin  (LIPITOR) 20 MG tablet TAKE 1 TABLET BY MOUTH AT BEDTIME 11/04/23   Bacchus, Gloria Z, FNP  Blood Pressure Monitoring (BLOOD PRESSURE MONITOR/L CUFF) MISC Dx: R03.0, elevated bp in office Patient not taking: Reported on 09/08/2024 08/01/22   Bacchus, Gloria Z, FNP  cholecalciferol (VITAMIN D3) 25 MCG (1000 UNIT) tablet Take 1,000 Units by mouth daily. Patient not taking: Reported on 09/08/2024     [provider]  CLARITIN 10 MG tablet Take 10 mg by mouth daily. Patient not taking: Reported on 09/08/2024 03/30/24   [provider]  Continuous Glucose Receiver (FREESTYLE LIBRE 2 READER) DEVI  05/25/24   [provider]  Continuous Glucose Sensor (DEXCOM G7 SENSOR) MISC USE AS DIRECTED TO test BLOOD SUGAR FOUR TIMES DAILY. CHANGE sensor EVERY 10 DAYS 01/07/24   Bacchus, Meade PEDLAR, FNP  donepezil  (ARICEPT ) 10 MG tablet TAKE 1 TABLET BY MOUTH EVERY DAY 11/04/23   Bacchus, Meade PEDLAR, FNP  EPINEPHrine  0.3 mg/0.3 mL IJ SOAJ injection Inject 0.3 mg into the muscle as needed for anaphylaxis. 05/24/22   Bacchus, Meade PEDLAR, FNP  escitalopram  (LEXAPRO ) 20 MG tablet TAKE 1 TABLET BY MOUTH EVERY DAY 02/03/24   Del Orbe Polanco, Hilario, FNP  esomeprazole (NEXIUM) 40 MG capsule Take 40 mg by mouth 2 (two) times daily. 05/25/24   [provider]  famotidine (PEPCID) 40 MG tablet Take 40 mg by mouth daily. 05/11/24   [provider]  glucose blood (TRUE METRIX BLOOD GLUCOSE TEST) test strip USE AS DIRECTED 08/08/22   Bacchus, Meade PEDLAR, FNP  insulin  glargine (LANTUS ) 100 UNIT/ML Solostar Pen Inject 20 Units into the skin at bedtime. 03/17/24   Therisa Benton PARAS, NP  Insulin  Pen Needle (BD AUTOSHIELD DUO) 30G X 5 MM MISC USE AS DIRECTED 07/18/23   Bacchus, Meade PEDLAR, FNP  levothyroxine  (SYNTHROID ) 88 MCG tablet  TAKE 1 TABLET BY MOUTH ONCE A DAY BEFORE BREAKFAST 01/02/24   Bacchus, Meade PEDLAR, FNP  loperamide (IMODIUM) 2 MG capsule Take 2 mg by mouth See admin instructions. Take 2 mg PO after each loose stool as needed for diarrhea (max of 3 doses in 24 hours) 09/29/21   [provider]  LORazepam (ATIVAN) 0.5 MG tablet Take 0.5 mg by mouth. 09/06/24   [provider]  meclizine  (ANTIVERT ) 12.5 MG tablet Take 1 tablet (12.5 mg total) by mouth 3 (three) times daily as needed for dizziness. 06/22/24   Melvenia Motto, MD  methocarbamol  (ROBAXIN ) 500 MG tablet Take 1 tablet (500 mg  total) by mouth 2 (two) times daily. 08/06/22   Bacchus, Meade PEDLAR, FNP  mirabegron  ER (MYRBETRIQ ) 25 MG TB24 tablet Take 1 tablet (25 mg total) by mouth daily. 07/14/24   Gerldine Lauraine BROCKS, FNP  Multiple Vitamins-Minerals (HAIR SKIN & NAILS ADVANCED) TABS Take 1 tablet by mouth daily. 05/18/24   [provider]  nystatin  cream (MYCOSTATIN ) apply TO SKIN folds, UNDER breasts, abdominal folds, AND inguinal folds TWICE DAILY FOR candidasis 07/18/23   Bacchus, Gloria Z, FNP  ondansetron  (ZOFRAN -ODT) 4 MG disintegrating tablet Take 1 tablet (4 mg total) by mouth every 8 (eight) hours as needed. 06/22/24   Melvenia Motto, MD  pantoprazole  (PROTONIX ) 40 MG tablet Take 1 tablet (40 mg total) by mouth 2 (two) times daily. 10/01/23   Bacchus, Meade PEDLAR, FNP  prazosin  (MINIPRESS ) 2 MG capsule TAKE ONE CAPSULE BY MOUTH AT BEDTIME 11/04/23   Bacchus, Gloria Z, FNP  Semaglutide  (RYBELSUS ) 14 MG TABS Take 1 tablet (14 mg total) by mouth daily. 03/17/24   Therisa Benton PARAS, NP  Specialty Vitamins Products (COLLAGEN ULTRA PO) Take by mouth.    [provider]  sucralfate (CARAFATE) 1 g tablet Take 1 g by mouth 4 (four) times daily. 05/11/24   [provider]  vitamin B-12 (CYANOCOBALAMIN ) 1000 MCG tablet Take 1,000 mcg by mouth daily.    [provider]  Vitamin D , Ergocalciferol , (DRISDOL ) 1.25 MG (50000 UNIT) CAPS capsule TAKE ONE CAPSULE BY MOUTH ONCE WEEKLY ON TUESDAY 02/04/24   Bacchus, Gloria Z, FNP    Allergies: Coconut oil, Ambien [zolpidem tartrate], Atorvastatin , Codeine, Onion, Other, Rosuvastatin , Bee venom, and Sulfa antibiotics    Review of Systems  Constitutional:  Negative for chills and fever.  Respiratory:  Negative for cough.   Gastrointestinal:  Positive for abdominal pain, diarrhea and vomiting.    Updated Vital Signs BP (!) 153/120   Pulse 72   Temp 98.3 F (36.8 C) (Oral)   Resp 16   Ht 5' 7.5 (1.715 m)   Wt 83.1 kg   SpO2 96%   BMI 28.27 kg/m    Physical Exam Vitals and nursing note reviewed.  Constitutional:      General: She is not in acute distress.    Appearance: Normal appearance. She is well-developed.  HENT:     Head: Normocephalic and atraumatic.  Eyes:     Conjunctiva/sclera: Conjunctivae normal.  Cardiovascular:     Rate and Rhythm: Normal rate and regular rhythm.     Heart sounds: No murmur heard. Pulmonary:     Effort: Pulmonary effort is normal. No respiratory distress.     Breath sounds: Normal breath sounds. No stridor. No wheezing.  Abdominal:     Palpations: Abdomen is soft.     Tenderness: There is no abdominal tenderness. There is no guarding or rebound.  Musculoskeletal:  General: No tenderness or deformity. Normal range of motion.     Cervical back: Neck supple.  Skin:    General: Skin is warm and dry.  Neurological:     General: No focal deficit present.     Mental Status: She is alert.     GCS: GCS eye subscore is 4. GCS verbal subscore is 5. GCS motor subscore is 6.     (all labs ordered are listed, but only abnormal results are displayed) Labs Reviewed - No data to display  EKG: None  Radiology: No results found.  {Document cardiac monitor, telemetry assessment procedure when appropriate:32947} Procedures   Medications Ordered in the ED  sodium chloride  0.9 % bolus 1,000 mL (has no administration in time range)  ondansetron  (ZOFRAN ) injection 4 mg (has no administration in time range)  pantoprazole  (PROTONIX ) injection 40 mg (has no administration in time range)      {Click here for ABCD2, HEART and other calculators REFRESH Note before signing:1}                              Medical Decision Making Amount and/or Complexity of Data Reviewed Labs: ordered. Radiology: ordered.  Risk Prescription drug management.   This patient complains of ***; this involves an extensive number of treatment Options and is a complaint that carries with it a high risk of  complications and morbidity. The differential includes ***  I ordered, reviewed and interpreted labs, which included *** I ordered medication *** and reviewed PMP when indicated. I ordered imaging studies which included *** and I independently    visualized and interpreted imaging which showed *** Additional history obtained from *** Previous records obtained and reviewed *** I consulted *** and discussed lab and imaging findings and discussed disposition.  Cardiac monitoring reviewed, *** Social determinants considered, *** Critical Interventions: ***  After the interventions stated above, I reevaluated the patient and found *** Admission and further testing considered, ***   {Document critical care time when appropriate  Document review of labs and clinical decision tools ie [id number], etc  Document your independent review of radiology images and any outside records  Document your discussion with family members, caretakers and with consultants  Document social determinants of health affecting pt's care  Document your decision making why or why not admission, treatments were needed:32947:::1}   Final diagnoses:  None    ED Discharge Orders     None

## 2024-10-21 DIAGNOSIS — F411 Generalized anxiety disorder: Secondary | ICD-10-CM | POA: Diagnosis not present

## 2024-10-21 DIAGNOSIS — E119 Type 2 diabetes mellitus without complications: Secondary | ICD-10-CM | POA: Diagnosis not present

## 2024-10-22 DIAGNOSIS — F411 Generalized anxiety disorder: Secondary | ICD-10-CM | POA: Diagnosis not present

## 2024-10-22 DIAGNOSIS — F331 Major depressive disorder, recurrent, moderate: Secondary | ICD-10-CM | POA: Diagnosis not present

## 2024-10-22 DIAGNOSIS — F515 Nightmare disorder: Secondary | ICD-10-CM | POA: Diagnosis not present

## 2024-10-22 DIAGNOSIS — F02818 Dementia in other diseases classified elsewhere, unspecified severity, with other behavioral disturbance: Secondary | ICD-10-CM | POA: Diagnosis not present

## 2024-10-22 DIAGNOSIS — F03A3 Unspecified dementia, mild, with mood disturbance: Secondary | ICD-10-CM | POA: Diagnosis not present

## 2024-10-23 ENCOUNTER — Ambulatory Visit: Admitting: Nurse Practitioner

## 2024-10-26 DIAGNOSIS — E739 Lactose intolerance, unspecified: Secondary | ICD-10-CM | POA: Diagnosis not present

## 2024-10-26 DIAGNOSIS — R109 Unspecified abdominal pain: Secondary | ICD-10-CM | POA: Diagnosis not present

## 2024-10-26 DIAGNOSIS — R197 Diarrhea, unspecified: Secondary | ICD-10-CM | POA: Diagnosis not present

## 2024-10-26 DIAGNOSIS — K2970 Gastritis, unspecified, without bleeding: Secondary | ICD-10-CM | POA: Diagnosis not present

## 2024-10-26 DIAGNOSIS — K219 Gastro-esophageal reflux disease without esophagitis: Secondary | ICD-10-CM | POA: Diagnosis not present

## 2024-10-26 DIAGNOSIS — R112 Nausea with vomiting, unspecified: Secondary | ICD-10-CM | POA: Diagnosis not present

## 2024-10-30 DIAGNOSIS — E039 Hypothyroidism, unspecified: Secondary | ICD-10-CM | POA: Diagnosis not present

## 2024-11-13 DIAGNOSIS — F0390 Unspecified dementia without behavioral disturbance: Secondary | ICD-10-CM | POA: Diagnosis not present

## 2024-11-13 DIAGNOSIS — F331 Major depressive disorder, recurrent, moderate: Secondary | ICD-10-CM | POA: Diagnosis not present

## 2024-11-13 DIAGNOSIS — F411 Generalized anxiety disorder: Secondary | ICD-10-CM | POA: Diagnosis not present

## 2024-11-16 DIAGNOSIS — E1169 Type 2 diabetes mellitus with other specified complication: Secondary | ICD-10-CM | POA: Diagnosis not present

## 2024-11-16 DIAGNOSIS — M545 Low back pain, unspecified: Secondary | ICD-10-CM | POA: Diagnosis not present

## 2024-11-16 DIAGNOSIS — E039 Hypothyroidism, unspecified: Secondary | ICD-10-CM | POA: Diagnosis not present

## 2024-11-16 DIAGNOSIS — M65332 Trigger finger, left middle finger: Secondary | ICD-10-CM | POA: Diagnosis not present

## 2024-11-24 ENCOUNTER — Encounter: Payer: Self-pay | Admitting: Nurse Practitioner

## 2024-11-24 ENCOUNTER — Ambulatory Visit: Admitting: Nurse Practitioner

## 2024-11-24 VITALS — BP 118/80 | HR 82 | Ht 67.5 in | Wt 188.8 lb

## 2024-11-24 DIAGNOSIS — Z7984 Long term (current) use of oral hypoglycemic drugs: Secondary | ICD-10-CM

## 2024-11-24 DIAGNOSIS — E039 Hypothyroidism, unspecified: Secondary | ICD-10-CM

## 2024-11-24 DIAGNOSIS — E119 Type 2 diabetes mellitus without complications: Secondary | ICD-10-CM | POA: Diagnosis not present

## 2024-11-24 DIAGNOSIS — E559 Vitamin D deficiency, unspecified: Secondary | ICD-10-CM | POA: Diagnosis not present

## 2024-11-24 DIAGNOSIS — E1100 Type 2 diabetes mellitus with hyperosmolarity without nonketotic hyperglycemic-hyperosmolar coma (NKHHC): Secondary | ICD-10-CM

## 2024-11-24 DIAGNOSIS — Z794 Long term (current) use of insulin: Secondary | ICD-10-CM

## 2024-11-24 LAB — POCT GLYCOSYLATED HEMOGLOBIN (HGB A1C): Hemoglobin A1C: 7.1 % — AB (ref 4.0–5.6)

## 2024-11-24 MED ORDER — RYBELSUS 14 MG PO TABS: 14.0000 mg | ORAL_TABLET | Freq: Every day | ORAL | 3 refills | Status: AC

## 2024-11-24 MED ORDER — INSULIN GLARGINE 100 UNIT/ML SOLOSTAR PEN: 20.0000 [IU] | PEN_INJECTOR | Freq: Every day | SUBCUTANEOUS | 3 refills | Status: AC

## 2024-11-24 MED ORDER — FREESTYLE LIBRE 2 PLUS SENSOR MISC: 3 refills | Status: AC

## 2024-11-24 NOTE — Progress Notes (Signed)
 Endocrinology Follow Up Note       11/24/2024, 11:07 AM   Subjective:    Patient ID: [name], female    DOB: July 05, 1951.  [name] is being seen in follow up after being seen in consultation for management of currently uncontrolled symptomatic diabetes requested by  Leontine Phebe Charlotte ONEIDA, NP.   Past Medical History:  Diagnosis Date   Anemia    as a teenager   Anxiety    Arthritis    knees, rt hand,hips   Cancer (HCC)    vaginal,uterine,ovariam   COPD (chronic obstructive pulmonary disease) (HCC)    Depression    Diabetes mellitus without complication (HCC)    Hypothyroidism    Neuromuscular disorder (HCC)    hands and feet    Past Surgical History:  Procedure Laterality Date   ABDOMINAL HYSTERECTOMY     BIOPSY  08/06/2023   Procedure: BIOPSY;  Surgeon: Cindie Carlin MARLA, DO;  Location: [facility];  Service: Endoscopy;;   BREAST SURGERY Right 1988   lumpectomy   COLONOSCOPY WITH PROPOFOL  N/A 08/06/2023   Procedure: COLONOSCOPY WITH PROPOFOL ;  Surgeon: Cindie Carlin MARLA, DO;  Location: [facility];  Service: Endoscopy;  Laterality: N/A;  900am, asa 2   ESOPHAGOGASTRODUODENOSCOPY (EGD) WITH PROPOFOL  N/A 08/06/2023   Procedure: ESOPHAGOGASTRODUODENOSCOPY (EGD) WITH PROPOFOL ;  Surgeon: Cindie Carlin MARLA, DO;  Location: [facility];  Service: Endoscopy;  Laterality: N/A;   EYE SURGERY Bilateral    FRACTURE SURGERY     Rt ankle,lt knee cap,lt shoulder,rt wrist,rt shoulder,   POLYPECTOMY  08/06/2023   Procedure: POLYPECTOMY;  Surgeon: Cindie Carlin MARLA, DO;  Location: [facility];  Service: Endoscopy;;   TONSILLECTOMY     at age 32   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   Procedure: TOTAL KNEE ARTHROPLASTY;  Surgeon: Shari Sieving, MD;  Location: [facility];  Service: Orthopedics;  Laterality: Right;    Social History   Socioeconomic History   Marital status: Married    Spouse  name: Not on file   Number of children: Not on file   Years of education: Not on file   Highest education level: Not on file  Occupational History   Not on file  Tobacco Use   Smoking status: Former    Current packs/day: 0.00    Average packs/day: 0.5 packs/day for 15.0 years (7.5 ttl pk-yrs)    Types: Cigarettes    Start date: 65    Quit date: 2013    Years since quitting: 12.9   Smokeless tobacco: Never  Vaping Use   Vaping status: Never Used  Substance and Sexual Activity   Alcohol use: Never   Drug use: Never   Sexual activity: Not on file  Other Topics Concern   Not on file  Social History Narrative   Not on file   Social Drivers of Health   Financial Resource Strain: Low Risk  (07/25/2023)   Overall Financial Resource Strain (CARDIA)    Difficulty of Paying Living Expenses: Not hard at all  Food Insecurity: No Food Insecurity (07/25/2023)   Hunger Vital Sign    Worried About Running Out of Food in the  Last Year: Never true    Ran Out of Food in the Last Year: Never true  Transportation Needs: No Transportation Needs (07/25/2023)   PRAPARE - Administrator, Civil Service (Medical): No    Lack of Transportation (Non-Medical): No  Physical Activity: Sufficiently Active (07/25/2023)   Exercise Vital Sign    Days of Exercise per Week: 7 days    Minutes of Exercise per Session: 30 min  Stress: Stress Concern Present (07/25/2023)   [employer] of Occupational Health - Occupational Stress Questionnaire    Feeling of Stress : Very much  Social Connections: Moderately Isolated (07/25/2023)   Social Connection and Isolation Panel    Frequency of Communication with Friends and Family: More than three times a week    Frequency of Social Gatherings with Friends and Family: More than three times a week    Attends Religious Services: Never    Database Administrator or Organizations: No    Attends Engineer, Structural: Never    Marital Status: Married     Family History  Problem Relation Age of Onset   Cancer Mother    Hypertension Mother    Thyroid  disease Mother    Diabetes Mother    Stroke Mother    Heart failure Mother    Diabetes Father    Heart attack Father    Diabetes Sister    Diabetes Brother     Outpatient Encounter Medications as of 11/24/2024  Medication Sig   alendronate  (FOSAMAX ) 70 MG tablet TAKE 1 TABLET BY MOUTH ONCE A WEEK, TAKE with full GLASS of water  ON an EMPTY stomach   Apple Cider Vinegar 300 MG TABS Take by mouth.   atorvastatin  (LIPITOR) 20 MG tablet TAKE 1 TABLET BY MOUTH AT BEDTIME   CLARITIN 10 MG tablet Take 10 mg by mouth daily.   Continuous Glucose Sensor (FREESTYLE LIBRE 2 PLUS SENSOR) MISC Change sensor every 15 days.   donepezil  (ARICEPT ) 10 MG tablet TAKE 1 TABLET BY MOUTH EVERY DAY   EPINEPHrine  0.3 mg/0.3 mL IJ SOAJ injection Inject 0.3 mg into the muscle as needed for anaphylaxis.   escitalopram  (LEXAPRO ) 20 MG tablet TAKE 1 TABLET BY MOUTH EVERY DAY   esomeprazole (NEXIUM) 40 MG capsule Take 40 mg by mouth 2 (two) times daily.   famotidine (PEPCID) 40 MG tablet Take 40 mg by mouth daily.   fluconazole (DIFLUCAN) 150 MG tablet Take 150 mg by mouth daily.   glucose blood (TRUE METRIX BLOOD GLUCOSE TEST) test strip USE AS DIRECTED   Insulin  Pen Needle (BD AUTOSHIELD DUO) 30G X 5 MM MISC USE AS DIRECTED   levothyroxine  (SYNTHROID ) 112 MCG tablet Take 112 mcg by mouth daily.   loperamide (IMODIUM) 2 MG capsule Take 2 mg by mouth See admin instructions. Take 2 mg PO after each loose stool as needed for diarrhea (max of 3 doses in 24 hours)   LORazepam (ATIVAN) 0.5 MG tablet Take 0.5 mg by mouth.   meclizine  (ANTIVERT ) 12.5 MG tablet Take 1 tablet (12.5 mg total) by mouth 3 (three) times daily as needed for dizziness.   methocarbamol  (ROBAXIN ) 500 MG tablet Take 1 tablet (500 mg total) by mouth 2 (two) times daily.   mirabegron  ER (MYRBETRIQ ) 25 MG TB24 tablet Take 1 tablet (25 mg total) by  mouth daily.   Multiple Vitamins-Minerals (HAIR SKIN & NAILS ADVANCED) TABS Take 1 tablet by mouth daily.   ondansetron  (ZOFRAN -ODT) 4 MG disintegrating tablet Take 1  tablet (4 mg total) by mouth every 8 (eight) hours as needed.   pantoprazole  (PROTONIX ) 40 MG tablet Take 1 tablet (40 mg total) by mouth 2 (two) times daily.   prazosin  (MINIPRESS ) 2 MG capsule TAKE ONE CAPSULE BY MOUTH AT BEDTIME   Specialty Vitamins Products (COLLAGEN ULTRA PO) Take by mouth.   sucralfate (CARAFATE) 1 g tablet Take 1 g by mouth 4 (four) times daily.   vitamin B-12 (CYANOCOBALAMIN ) 1000 MCG tablet Take 1,000 mcg by mouth daily.   Vitamin D , Ergocalciferol , (DRISDOL ) 1.25 MG (50000 UNIT) CAPS capsule TAKE ONE CAPSULE BY MOUTH ONCE WEEKLY ON TUESDAY   [DISCONTINUED] Continuous Glucose Sensor (DEXCOM G7 SENSOR) MISC USE AS DIRECTED TO test BLOOD SUGAR FOUR TIMES DAILY. CHANGE sensor EVERY 10 DAYS   [DISCONTINUED] insulin  glargine (LANTUS ) 100 UNIT/ML Solostar Pen Inject 20 Units into the skin at bedtime.   [DISCONTINUED] levothyroxine  (SYNTHROID ) 100 MCG tablet Take 100 mcg by mouth daily.   [DISCONTINUED] levothyroxine  (SYNTHROID ) 88 MCG tablet TAKE 1 TABLET BY MOUTH ONCE A DAY BEFORE BREAKFAST (Patient taking differently: Take 112 mcg by mouth daily before breakfast.)   [DISCONTINUED] Semaglutide  (RYBELSUS ) 14 MG TABS Take 1 tablet (14 mg total) by mouth daily.   Continuous Glucose Receiver (FREESTYLE LIBRE 2 READER) DEVI  (Patient not taking: Reported on 11/24/2024)   insulin  glargine (LANTUS ) 100 UNIT/ML Solostar Pen Inject 20 Units into the skin at bedtime.   Semaglutide  (RYBELSUS ) 14 MG TABS Take 1 tablet (14 mg total) by mouth daily.   [DISCONTINUED] acyclovir  (ZOVIRAX ) 400 MG tablet TAKE 1 TABLET BY MOUTH EVERY TWELVE HOURS (AT BREAKFAST AND AT BEDTIME) (Patient not taking: Reported on 11/24/2024)   [DISCONTINUED] aspirin  81 MG chewable tablet Chew 1 tablet (81 mg total) by mouth 2 (two) times daily. (Patient not  taking: Reported on 11/24/2024)   [DISCONTINUED] Blood Pressure Monitoring (BLOOD PRESSURE MONITOR/L CUFF) MISC Dx: R03.0, elevated bp in office (Patient not taking: Reported on 11/24/2024)   [DISCONTINUED] cholecalciferol (VITAMIN D3) 25 MCG (1000 UNIT) tablet Take 1,000 Units by mouth daily. (Patient not taking: Reported on 11/24/2024)   [DISCONTINUED] nystatin  cream (MYCOSTATIN ) apply TO SKIN folds, UNDER breasts, abdominal folds, AND inguinal folds TWICE DAILY FOR candidasis (Patient not taking: Reported on 11/24/2024)   No facility-administered encounter medications on file as of 11/24/2024.    ALLERGIES: Allergies  Allergen Reactions   Coconut Oil Anaphylaxis and Swelling    Mouth swelling   Ambien [Zolpidem Tartrate] Other (See Comments)    Hallucinations/nightmares   Atorvastatin  Other (See Comments)    Leg cramps   Codeine Nausea And Vomiting   Onion Nausea Only and Other (See Comments)    Raw onions   Other Diarrhea and Other (See Comments)    Ground Beef (patient avoid due to side effect of diarrhea)   Rosuvastatin  Other (See Comments)    Myalgias    Bee Venom Swelling   Sulfa Antibiotics Rash and Other (See Comments)    Including topical sulfa  (mouth dries/peels/skin blisters)    VACCINATION STATUS: Immunization History  Administered Date(s) Administered   Fluad Quad(high Dose 65+) 12/03/2022   Pneumococcal Polysaccharide-23 08/22/2022   Tdap 05/29/2005, 05/22/2022   Zoster Recombinant(Shingrix ) 05/22/2022    Diabetes She presents for her follow-up diabetic visit. She has type 2 diabetes mellitus. Onset time: Diagnosed at approx age of 54. Her disease course has been stable. There are no hypoglycemic associated symptoms. Associated symptoms include foot paresthesias. Pertinent negatives for diabetes include no weight loss.  There are no hypoglycemic complications. Symptoms are stable. There are no diabetic complications. Risk factors for coronary artery disease include  diabetes mellitus, family history, obesity, sedentary lifestyle and post-menopausal. Current diabetic treatment includes oral agent (monotherapy) and insulin  injections. She is compliant with treatment most of the time. Her weight is decreasing steadily. She is following a generally healthy diet. Meal planning includes avoidance of concentrated sweets. She has not had a previous visit with a dietitian. She participates in exercise daily. Her home blood glucose trend is fluctuating minimally. Her breakfast blood glucose range is generally 90-110 mg/dl. Her overall blood glucose range is 140-180 mg/dl. (She presents today with her logs from the ALF showing at target fasting glycemic profile.  Her POCT A1c today is 7.1%, increasing from last visit of 6.9%, but still good target for her.  She denies any hypoglycemia.  Analysis of her CGM shows TIR 79%, TAR 21%, TBR 0% with a GMI of 7%.) An ACE inhibitor/angiotensin II receptor blocker is being taken. She sees a podiatrist.Eye exam is current.    Review of systems  Constitutional: + Minimally fluctuating body weight,  current Body mass index is 29.13 kg/m. , no fatigue, no subjective hyperthermia, no subjective hypothermia Eyes: no blurry vision, no xerophthalmia ENT: no sore throat, no nodules palpated in throat, no dysphagia/odynophagia, no hoarseness Cardiovascular: no chest pain, no shortness of breath, no palpitations, no leg swelling Respiratory: no cough, no shortness of breath Gastrointestinal: no nausea/vomiting/diarrhea Musculoskeletal: reports bilateral foot pain- seeing podiatry in February. Skin: no rashes, no hyperemia Neurological: no tremors, no numbness, no tingling, no dizziness Psychiatric: no depression, no anxiety  Objective:     BP 118/80 (BP Location: Left Arm, Patient Position: Sitting, Cuff Size: Large)   Pulse 82   Ht 5' 7.5 (1.715 m)   Wt 188 lb 12.8 oz (85.6 kg)   BMI 29.13 kg/m   Wt Readings from Last 3 Encounters:   11/24/24 188 lb 12.8 oz (85.6 kg)  10/20/24 183 lb 3.2 oz (83.1 kg)  08/18/24 183 lb 3.2 oz (83.1 kg)     BP Readings from Last 3 Encounters:  11/24/24 118/80  10/20/24 (!) 100/59  09/08/24 106/68       Physical Exam- Limited  Constitutional:  Body mass index is 29.13 kg/m. , not in acute distress, normal state of mind Eyes:  EOMI, no exophthalmos Cardiovascular: RRR, no murmurs, rubs, or gallops, no edema Musculoskeletal: no gross deformities, strength intact in all four extremities, no gross restriction of joint movements Skin:  no rashes, no hyperemia Neurological: no tremor with outstretched hands   Diabetic Foot Exam - Simple   Simple Foot Form Diabetic Foot exam was performed with the following findings: Yes 11/24/2024 10:45 AM  Visual Inspection See comments: Yes Sensation Testing Intact to touch and monofilament testing bilaterally: Yes Pulse Check Posterior Tibialis and Dorsalis pulse intact bilaterally: Yes Comments Calluses bilaterally- seeing podiatry in February     CMP ( most recent) CMP     Component Value Date/Time   NA 139 10/20/2024 1927   NA 139 11/11/2023 1028   K 3.9 10/20/2024 1927   CL 102 10/20/2024 1927   CO2 27 10/20/2024 1927   GLUCOSE 144 (H) 10/20/2024 1927   BUN 11 10/20/2024 1927   BUN 14 11/11/2023 1028   CREATININE 0.72 10/20/2024 1927   CALCIUM  8.9 10/20/2024 1927   PROT 6.6 10/20/2024 1927   PROT 6.1 07/08/2023 0914   ALBUMIN 3.7 10/20/2024 1927  ALBUMIN 3.9 07/08/2023 0914   AST 36 10/20/2024 1927   ALT 28 10/20/2024 1927   ALKPHOS 36 (L) 10/20/2024 1927   BILITOT 0.6 10/20/2024 1927   BILITOT 0.4 07/08/2023 0914   GFRNONAA >60 10/20/2024 1927   GFRAA >60 09/12/2019 0239     Diabetic Labs (most recent): Lab Results  Component Value Date   HGBA1C 7.1 (A) 11/24/2024   HGBA1C 6.9 (A) 07/21/2024   HGBA1C 5.8 (A) 03/17/2024     Lipid Panel ( most recent) Lipid Panel     Component Value Date/Time   CHOL 138  01/29/2024 1310   TRIG 82 01/29/2024 1310   HDL 53 01/29/2024 1310   CHOLHDL 2.6 01/29/2024 1310   LDLCALC 69 01/29/2024 1310   LABVLDL 16 01/29/2024 1310      Lab Results  Component Value Date   TSH 2.890 01/29/2024   TSH 0.313 (L) 07/08/2023   TSH 0.648 02/22/2023   TSH 0.317 (L) 08/22/2022   TSH 0.403 (L) 05/22/2022   FREET4 1.15 01/29/2024   FREET4 1.43 07/08/2023   FREET4 1.25 02/22/2023   FREET4 1.46 08/22/2022   FREET4 1.52 05/22/2022           Assessment & Plan:   1) Type 2 diabetes mellitus without complication, with long-term current use of insulin  (HCC)  She presents today with her logs from the ALF showing at target fasting glycemic profile.  Her POCT A1c today is 7.1%, increasing from last visit of 6.9%, but still good target for her.  She denies any hypoglycemia.  Analysis of her CGM shows TIR 79%, TAR 21%, TBR 0% with a GMI of 7%.  - [name] has currently uncontrolled symptomatic type 2 DM since 73 years of age.   -Recent labs reviewed.  - I had a long discussion with her about the progressive nature of diabetes and the pathology behind its complications. -her diabetes is not currently complicated but she remains at a high risk for more acute and chronic complications which include CAD, CVA, CKD, retinopathy, and neuropathy. These are all discussed in detail with her.  The following Lifestyle Medicine recommendations according to American College of Lifestyle Medicine [facility]) were discussed and offered to patient and she agrees to start the journey:  A. Whole Foods, Plant-based plate comprising of fruits and vegetables, plant-based proteins, whole-grain carbohydrates was discussed in detail with the patient.   A list for source of those nutrients were also provided to the patient.  Patient will use only water  or unsweetened tea for hydration. B.  The need to stay away from risky substances including alcohol, smoking; obtaining 7 to 9 hours of  restorative sleep, at least 150 minutes of moderate intensity exercise weekly, the importance of healthy social connections,  and stress reduction techniques were discussed. C.  A full color page of  Calorie density of various food groups per pound showing examples of each food groups was provided to the patient.  - Nutritional counseling repeated/built upon at each appointment.  - The patient admits there is a room for improvement in their diet and drink choices. -  Suggestion is made for the patient to avoid simple carbohydrates from their diet including Cakes, Sweet Desserts / Pastries, Ice Cream, Soda (diet and regular), Sweet Tea, Candies, Chips, Cookies, Sweet Pastries, Store Bought Juices, Alcohol in Excess of 1-2 drinks a day, Artificial Sweeteners, Coffee Creamer, and Sugar-free Products. This will help patient to have stable blood glucose profile and potentially avoid unintended  weight gain.   - I encouraged the patient to switch to unprocessed or minimally processed complex starch and increased protein intake (animal or plant source), fruits, and vegetables.   - Patient is advised to stick to a routine mealtimes to eat 3 meals a day and avoid unnecessary snacks (to snack only to correct hypoglycemia).  - I have approached her with the following individualized plan to manage her diabetes and patient agrees:   -Based on her stable glycemic profile, no changes will be made to her regimen today.  She is advised to continue Lantus  20 units SQ nightly and Rybelsus  14 mg po daily before breakfast.  May look to lower or discontinue insulin  at next visit.  -she is encouraged to continue monitoring glucose 4 times daily (using her CGM), before meals and before bed, and to call the clinic if she has readings less than 70 or above 300 for 3 tests in a row.   - she is warned not to take insulin  without proper monitoring per orders. - Adjustment parameters are given to her for hypo and  hyperglycemia in writing.  - she is not a candidate for sulfa meds due to allergy.  - Specific targets for  A1c; LDL, HDL, and Triglycerides were discussed with the patient.  2) Blood Pressure /Hypertension:  her blood pressure is controlled to target without the use of antihypertensive medications.  3) Lipids/Hyperlipidemia:    Review of her recent lipid panel from 01/29/24 showed controlled LDL at 69.  she is advised to continue Lipitor 20 mg daily at bedtime.  Side effects and precautions discussed with her.  4)  Weight/Diet:  her Body mass index is 29.13 kg/m.  -  clearly complicating her diabetes care.   she is a candidate for weight loss. I discussed with her the fact that loss of 5 - 10% of her  current body weight will have the most impact on her diabetes management.  Exercise, and detailed carbohydrates information provided  -  detailed on discharge instructions.  5) Hypothyroidism-unspecified -Currently managed by her PCP.  6) Vitamin D  deficiency Her recent vitamin D  level on 01/29/24 was 48.9.  Her PCP put her on Ergocalciferol  50000 units weekly.  7) Chronic Care/Health Maintenance: -she is not on ACEI/ARB and is on Statin medications and is encouraged to initiate and continue to follow up with Ophthalmology, Dentist, Podiatrist at least yearly or according to recommendations, and advised to stay away from smoking. I have recommended yearly flu vaccine and pneumonia vaccine at least every 5 years; moderate intensity exercise for up to 150 minutes weekly; and sleep for at least 7 hours a day.  - she is advised to maintain close follow up with Nguyen, Hong Thu T, NP for primary care needs, as well as her other providers for optimal and coordinated care.     I spent  46  minutes in the care of the patient today including review of labs from CMP, Lipids, Thyroid  Function, Hematology (current and previous including abstractions from other facilities); face-to-face time discussing   her blood glucose readings/logs, discussing hypoglycemia and hyperglycemia episodes and symptoms, medications doses, her options of short and long term treatment based on the latest standards of care / guidelines;  discussion about incorporating lifestyle medicine;  and documenting the encounter. Risk reduction counseling performed per USPSTF guidelines to reduce obesity and cardiovascular risk factors.     Please refer to Patient Instructions for Blood Glucose Monitoring and Insulin /Medications Dosing Guide  in  media tab for additional information. Please  also refer to  Patient Self Inventory in the Media  tab for reviewed elements of pertinent patient history.  [name] participated in the discussions, expressed understanding, and voiced agreement with the above plans.  All questions were answered to her satisfaction. she is encouraged to contact clinic should she have any questions or concerns prior to her return visit.     Follow up plan: - Return in about 6 months (around 05/25/2025) for Diabetes F/U with A1c in office, No previsit labs, Bring meter and logs.  Benton Rio, [facility] [address] [place], [place] [address] Phone: [phone] Fax: [phone]  11/24/2024, 11:07 AM

## 2024-11-30 DIAGNOSIS — E1169 Type 2 diabetes mellitus with other specified complication: Secondary | ICD-10-CM | POA: Diagnosis not present

## 2024-12-15 ENCOUNTER — Ambulatory Visit: Admitting: Gastroenterology

## 2024-12-24 ENCOUNTER — Encounter: Payer: Self-pay | Admitting: Gastroenterology

## 2025-01-01 ENCOUNTER — Emergency Department (HOSPITAL_COMMUNITY)
Admission: EM | Admit: 2025-01-01 | Discharge: 2025-01-02 | Disposition: A | Attending: Emergency Medicine | Admitting: Emergency Medicine

## 2025-01-01 ENCOUNTER — Emergency Department (HOSPITAL_COMMUNITY)

## 2025-01-01 ENCOUNTER — Encounter (HOSPITAL_COMMUNITY): Payer: Self-pay | Admitting: Emergency Medicine

## 2025-01-01 ENCOUNTER — Other Ambulatory Visit: Payer: Self-pay

## 2025-01-01 DIAGNOSIS — S5001XA Contusion of right elbow, initial encounter: Secondary | ICD-10-CM

## 2025-01-01 DIAGNOSIS — S50311A Abrasion of right elbow, initial encounter: Secondary | ICD-10-CM | POA: Insufficient documentation

## 2025-01-01 DIAGNOSIS — M542 Cervicalgia: Secondary | ICD-10-CM | POA: Diagnosis not present

## 2025-01-01 DIAGNOSIS — M25551 Pain in right hip: Secondary | ICD-10-CM | POA: Insufficient documentation

## 2025-01-01 DIAGNOSIS — R42 Dizziness and giddiness: Secondary | ICD-10-CM | POA: Insufficient documentation

## 2025-01-01 DIAGNOSIS — Z79899 Other long term (current) drug therapy: Secondary | ICD-10-CM | POA: Diagnosis not present

## 2025-01-01 DIAGNOSIS — M25511 Pain in right shoulder: Secondary | ICD-10-CM | POA: Insufficient documentation

## 2025-01-01 DIAGNOSIS — W1830XA Fall on same level, unspecified, initial encounter: Secondary | ICD-10-CM | POA: Insufficient documentation

## 2025-01-01 DIAGNOSIS — W19XXXA Unspecified fall, initial encounter: Secondary | ICD-10-CM

## 2025-01-01 DIAGNOSIS — M25552 Pain in left hip: Secondary | ICD-10-CM | POA: Diagnosis not present

## 2025-01-01 DIAGNOSIS — M25521 Pain in right elbow: Secondary | ICD-10-CM | POA: Diagnosis present

## 2025-01-01 LAB — URINALYSIS, ROUTINE W REFLEX MICROSCOPIC
Bacteria, UA: NONE SEEN
Bilirubin Urine: NEGATIVE
Glucose, UA: >=500 mg/dL — AB
Hgb urine dipstick: NEGATIVE
Ketones, ur: NEGATIVE mg/dL
Leukocytes,Ua: NEGATIVE
Nitrite: NEGATIVE
Protein, ur: NEGATIVE mg/dL
Specific Gravity, Urine: 1.012 (ref 1.005–1.030)
pH: 5 (ref 5.0–8.0)

## 2025-01-01 LAB — CBC WITH DIFFERENTIAL/PLATELET
Abs Immature Granulocytes: 0.01 K/uL (ref 0.00–0.07)
Basophils Absolute: 0 K/uL (ref 0.0–0.1)
Basophils Relative: 1 %
Eosinophils Absolute: 0.1 K/uL (ref 0.0–0.5)
Eosinophils Relative: 1 %
HCT: 36.5 % (ref 36.0–46.0)
Hemoglobin: 11.8 g/dL — ABNORMAL LOW (ref 12.0–15.0)
Immature Granulocytes: 0 %
Lymphocytes Relative: 24 %
Lymphs Abs: 1 K/uL (ref 0.7–4.0)
MCH: 30 pg (ref 26.0–34.0)
MCHC: 32.3 g/dL (ref 30.0–36.0)
MCV: 92.9 fL (ref 80.0–100.0)
Monocytes Absolute: 0.5 K/uL (ref 0.1–1.0)
Monocytes Relative: 11 %
Neutro Abs: 2.6 K/uL (ref 1.7–7.7)
Neutrophils Relative %: 63 %
Platelets: 163 K/uL (ref 150–400)
RBC: 3.93 MIL/uL (ref 3.87–5.11)
RDW: 12.9 % (ref 11.5–15.5)
WBC: 4.2 K/uL (ref 4.0–10.5)
nRBC: 0 % (ref 0.0–0.2)

## 2025-01-01 LAB — COMPREHENSIVE METABOLIC PANEL WITH GFR
ALT: 34 U/L (ref 0–44)
AST: 43 U/L — ABNORMAL HIGH (ref 15–41)
Albumin: 4 g/dL (ref 3.5–5.0)
Alkaline Phosphatase: 42 U/L (ref 38–126)
Anion gap: 12 (ref 5–15)
BUN: 14 mg/dL (ref 8–23)
CO2: 25 mmol/L (ref 22–32)
Calcium: 9.2 mg/dL (ref 8.9–10.3)
Chloride: 102 mmol/L (ref 98–111)
Creatinine, Ser: 0.79 mg/dL (ref 0.44–1.00)
GFR, Estimated: >60 mL/min
Glucose, Bld: 227 mg/dL — ABNORMAL HIGH (ref 70–99)
Potassium: 4.2 mmol/L (ref 3.5–5.1)
Sodium: 138 mmol/L (ref 135–145)
Total Bilirubin: 0.3 mg/dL (ref 0.0–1.2)
Total Protein: 6.8 g/dL (ref 6.5–8.1)

## 2025-01-01 LAB — TROPONIN T, HIGH SENSITIVITY: Troponin T High Sensitivity: <15 ng/L (ref 0–19)

## 2025-01-01 MED ORDER — HYDRALAZINE HCL 20 MG/ML IJ SOLN
5.0000 mg | Freq: Once | INTRAMUSCULAR | Status: AC
  Administered 2025-01-01: 5 mg via INTRAVENOUS
  Filled 2025-01-01: qty 1

## 2025-01-01 MED ORDER — MECLIZINE HCL 12.5 MG PO TABS
25.0000 mg | ORAL_TABLET | Freq: Once | ORAL | Status: AC
  Administered 2025-01-01: 25 mg via ORAL
  Filled 2025-01-01: qty 2

## 2025-01-01 MED ORDER — SODIUM CHLORIDE 0.9 % IV BOLUS
500.0000 mL | Freq: Once | INTRAVENOUS | Status: AC
  Administered 2025-01-01: 500 mL via INTRAVENOUS

## 2025-01-01 NOTE — ED Notes (Signed)
 Lab at bedside

## 2025-01-01 NOTE — ED Notes (Signed)
 Nurse Erminio was given report at [facility]. She states to use the number [phone] at discharge. I informed her that was the number I have called and get voicemail.

## 2025-01-01 NOTE — ED Provider Notes (Signed)
 " [place] EMERGENCY DEPARTMENT AT [facility] Provider Note   CSN: [id number] Arrival date & time: 01/01/25  2025     Patient presents with: [name] is a 74 y.o. female.   Patient is a 74 year old female who presents to the emergency department secondary to pain to the neck, right shoulder and elbow, bilateral hips following a fall which occurred just prior to arrival.  Patient notes that she was walking through her assisted living facility when she fell after becoming dizzy.  Patient notes that the dizziness has improved at this point.  She notes that there was no associated numbness, paresthesias or unilateral weakness.  She denies any active headache at this point.  There was no preceding chest pain, shortness of breath or palpitations.  She denies any abdominal pain, nausea, vomiting, diarrhea.  She denies any anticoagulation use.   Fall       Prior to Admission medications  Medication Sig Start Date End Date Taking? Authorizing Provider  alendronate  (FOSAMAX ) 70 MG tablet TAKE 1 TABLET BY MOUTH ONCE A WEEK, TAKE with full GLASS of water  ON an EMPTY stomach 10/04/23   Bacchus, Meade PEDLAR, FNP  Apple Cider Vinegar 300 MG TABS Take by mouth.    [provider]  atorvastatin  (LIPITOR) 20 MG tablet TAKE 1 TABLET BY MOUTH AT BEDTIME 11/04/23   Bacchus, Meade PEDLAR, FNP  CLARITIN 10 MG tablet Take 10 mg by mouth daily. 03/30/24   [provider]  Continuous Glucose Receiver (FREESTYLE LIBRE 2 READER) DEVI  05/25/24   [provider]  Continuous Glucose Sensor (FREESTYLE LIBRE 2 PLUS SENSOR) MISC Change sensor every 15 days. 11/24/24   Therisa Benton PARAS, NP  donepezil  (ARICEPT ) 10 MG tablet TAKE 1 TABLET BY MOUTH EVERY DAY 11/04/23   Bacchus, Meade PEDLAR, FNP  EPINEPHrine  0.3 mg/0.3 mL IJ SOAJ injection Inject 0.3 mg into the muscle as needed for anaphylaxis. 05/24/22   Bacchus, Meade PEDLAR, FNP  escitalopram  (LEXAPRO ) 20 MG tablet TAKE 1 TABLET BY  MOUTH EVERY DAY 02/03/24   Del Orbe Polanco, Hilario, FNP  esomeprazole (NEXIUM) 40 MG capsule Take 40 mg by mouth 2 (two) times daily. 05/25/24   [provider]  famotidine (PEPCID) 40 MG tablet Take 40 mg by mouth daily. 05/11/24   [provider]  fluconazole (DIFLUCAN) 150 MG tablet Take 150 mg by mouth daily.    [provider]  glucose blood (TRUE METRIX BLOOD GLUCOSE TEST) test strip USE AS DIRECTED 08/08/22   Bacchus, Meade PEDLAR, FNP  insulin  glargine (LANTUS ) 100 UNIT/ML Solostar Pen Inject 20 Units into the skin at bedtime. 11/24/24   Therisa Benton PARAS, NP  Insulin  Pen Needle (BD AUTOSHIELD DUO) 30G X 5 MM MISC USE AS DIRECTED 07/18/23   Bacchus, Meade PEDLAR, FNP  levothyroxine  (SYNTHROID ) 112 MCG tablet Take 112 mcg by mouth daily. 11/16/24   [provider]  loperamide (IMODIUM) 2 MG capsule Take 2 mg by mouth See admin instructions. Take 2 mg PO after each loose stool as needed for diarrhea (max of 3 doses in 24 hours) 09/29/21   [provider]  LORazepam (ATIVAN) 0.5 MG tablet Take 0.5 mg by mouth. 09/06/24   [provider]  meclizine  (ANTIVERT ) 12.5 MG tablet Take 1 tablet (12.5 mg total) by mouth 3 (three) times daily as needed for dizziness. 06/22/24   Melvenia Motto, MD  methocarbamol  (ROBAXIN ) 500 MG tablet Take 1 tablet (500 mg total)  by mouth 2 (two) times daily. 08/06/22   Bacchus, Meade PEDLAR, FNP  mirabegron  ER (MYRBETRIQ ) 25 MG TB24 tablet Take 1 tablet (25 mg total) by mouth daily. 07/14/24   Gerldine Lauraine BROCKS, FNP  Multiple Vitamins-Minerals (HAIR SKIN & NAILS ADVANCED) TABS Take 1 tablet by mouth daily. 05/18/24   [provider]  ondansetron  (ZOFRAN -ODT) 4 MG disintegrating tablet Take 1 tablet (4 mg total) by mouth every 8 (eight) hours as needed. 06/22/24   Melvenia Motto, MD  pantoprazole  (PROTONIX ) 40 MG tablet Take 1 tablet (40 mg total) by mouth 2 (two) times daily. 10/01/23   Bacchus, Meade PEDLAR, FNP  prazosin  (MINIPRESS ) 2 MG  capsule TAKE ONE CAPSULE BY MOUTH AT BEDTIME 11/04/23   Bacchus, Gloria Z, FNP  Semaglutide  (RYBELSUS ) 14 MG TABS Take 1 tablet (14 mg total) by mouth daily. 11/24/24   Therisa Benton PARAS, NP  Specialty Vitamins Products (COLLAGEN ULTRA PO) Take by mouth.    [provider]  sucralfate (CARAFATE) 1 g tablet Take 1 g by mouth 4 (four) times daily. 05/11/24   [provider]  vitamin B-12 (CYANOCOBALAMIN ) 1000 MCG tablet Take 1,000 mcg by mouth daily.    [provider]  Vitamin D , Ergocalciferol , (DRISDOL ) 1.25 MG (50000 UNIT) CAPS capsule TAKE ONE CAPSULE BY MOUTH ONCE WEEKLY ON TUESDAY 02/04/24   Bacchus, Gloria Z, FNP    Allergies: Coconut oil, Ambien [zolpidem tartrate], Atorvastatin , Codeine, Onion, Other, Rosuvastatin , Bee venom, and Sulfa antibiotics    Review of Systems  Musculoskeletal:        Pain to neck, right shoulder, elbow, bilateral hips  All other systems reviewed and are negative.   Updated Vital Signs BP (!) 157/81   Pulse 62   Temp (!) 97.5 F (36.4 C) (Oral)   Resp 16   Ht 5' 7 (1.702 m)   Wt 77.1 kg   SpO2 96%   BMI 26.63 kg/m   Physical Exam Vitals and nursing note reviewed.  Constitutional:      General: She is not in acute distress.    Appearance: Normal appearance. She is not ill-appearing.  HENT:     Head: Normocephalic and atraumatic.     Nose: Nose normal.     Mouth/Throat:     Mouth: Mucous membranes are moist.  Eyes:     Extraocular Movements: Extraocular movements intact.     Conjunctiva/sclera: Conjunctivae normal.     Pupils: Pupils are equal, round, and reactive to light.  Neck:     Comments: No step-off or deformity, c-collar in place Cardiovascular:     Rate and Rhythm: Normal rate and regular rhythm.     Pulses: Normal pulses.     Heart sounds: Normal heart sounds. No murmur heard.    No gallop.  Pulmonary:     Effort: Pulmonary effort is normal. No respiratory distress.     Breath sounds: Normal breath  sounds. No stridor. No wheezing, rhonchi or rales.  Chest:     Chest wall: No tenderness.  Abdominal:     General: Abdomen is flat. Bowel sounds are normal. There is no distension.     Palpations: Abdomen is soft.     Tenderness: There is no abdominal tenderness. There is no guarding.     Comments: No abdominal bruising  Musculoskeletal:        General: Normal range of motion.     Cervical back: Normal range of motion and neck supple. Tenderness present. No rigidity.  Comments: Tender to palpation noted over right shoulder, right elbow and bilateral hips, pelvis stable to AP lateral compression, nontender palpation over left upper extremity diffusely, bilateral knees, ankles, feet, nontender palpation over right wrist or hand, radial pulse 2+ distally 2+ distally extremities, DP and PT pulses 2+ distally in bilateral lower extremities, sensation intact distally throughout, strength 5 out of 5 in bilateral upper and lower extremities, full range of motion noted throughout, no obvious deformity or bruising, no skin breakdown or ulceration, no lacerations  Skin:    General: Skin is warm and dry.     Findings: No rash.     Comments: Abrasion noted to right elbow  Neurological:     General: No focal deficit present.     Mental Status: She is alert and oriented to person, place, and time. Mental status is at baseline.     Cranial Nerves: No cranial nerve deficit.     Sensory: No sensory deficit.     Motor: No weakness.     Coordination: Coordination normal.     Gait: Gait normal.  Psychiatric:        Mood and Affect: Mood normal.        Behavior: Behavior normal.        Thought Content: Thought content normal.        Judgment: Judgment normal.     (all labs ordered are listed, but only abnormal results are displayed) Labs Reviewed  COMPREHENSIVE METABOLIC PANEL WITH GFR  CBC WITH DIFFERENTIAL/PLATELET  URINALYSIS, ROUTINE W REFLEX MICROSCOPIC  TROPONIN T, HIGH SENSITIVITY     EKG: None  Radiology: CT Cervical Spine Wo Contrast Result Date: 01/01/2025 CLINICAL DATA:  Fell, neck pain EXAM: CT CERVICAL SPINE WITHOUT CONTRAST TECHNIQUE: Multidetector CT imaging of the cervical spine was performed without intravenous contrast. Multiplanar CT image reconstructions were also generated. RADIATION DOSE REDUCTION: This exam was performed according to the departmental dose-optimization program which includes automated exposure control, adjustment of the mA and/or kV according to patient size and/or use of iterative reconstruction technique. COMPARISON:  None Available. FINDINGS: Alignment: Alignment is anatomic. Skull base and vertebrae: No acute fracture. No primary bone lesion or focal pathologic process. Soft tissues and spinal canal: No prevertebral fluid or swelling. No visible canal hematoma. Disc levels: Left predominant multilevel cervical spondylosis greatest from C2-3 through C5-6. Mild diffuse spondylosis greatest at the C6-7 level. Upper chest: Airway is patent.  Lung apices are clear. Other: Reconstructed images demonstrate no additional findings. IMPRESSION: 1. No acute cervical spine fracture. 2. Multilevel cervical degenerative changes. Electronically Signed   By: Ozell Daring M.D.   On: 01/01/2025 21:33   CT Head Wo Contrast Result Date: 01/01/2025 CLINICAL DATA:  [place] EXAM: CT HEAD WITHOUT CONTRAST TECHNIQUE: Contiguous axial images were obtained from the base of the skull through the vertex without intravenous contrast. RADIATION DOSE REDUCTION: This exam was performed according to the departmental dose-optimization program which includes automated exposure control, adjustment of the mA and/or kV according to patient size and/or use of iterative reconstruction technique. COMPARISON:  06/22/2024 FINDINGS: Brain: No acute infarct or hemorrhage. Lateral ventricles and midline structures are unremarkable. No acute extra-axial fluid collections. No mass effect. Vascular:  No hyperdense vessel or unexpected calcification. Skull: Normal. Negative for fracture or focal lesion. Sinuses/Orbits: No acute finding. Other: None. IMPRESSION: 1. No acute intracranial process. Electronically Signed   By: Ozell Daring M.D.   On: 01/01/2025 21:32     Procedures   Medications Ordered in  the ED - No data to display                                  Medical Decision Making Amount and/or Complexity of Data Reviewed Labs: ordered. Radiology: ordered.  Risk Prescription drug management.   This patient presents to the ED for concern of fall, dizziness differential diagnosis includes intracranial hemorrhage, vertebral fracture, long bone or joint fracture, electrolyte derangement, acute kidney injury, dehydration, labyrinthitis, Mnire's disease, benign positional vertigo, CVA, TIA, space-occupying lesion    Additional history obtained:  Additional history obtained from none External records from outside source obtained and reviewed including none   Lab Tests:  I Ordered, and personally interpreted labs.  The pertinent results include: No leukocytosis, mild anemia but at baseline, no electrolyte changes, normal kidney function and liver function, unremarkable urinalysis, negative troponin   Imaging Studies ordered:  I ordered imaging studies including CT scan head and cervical spine, x-ray of right shoulder, elbow, bilateral hips and chest I independently visualized and interpreted imaging which showed no acute intracranial hemorrhage, no cervical spine fracture, no acute osseous injury of bilateral hips, right shoulder or elbow, no acute cardiopulmonary process I agree with the radiologist interpretation   Medicines ordered and prescription drug management:  I ordered medication including meclizine , IV fluids, hydralazine  for positional dizziness, hypertension Reevaluation of the patient after these medicines showed that the patient improved I have reviewed  the patients home medicines and have made adjustments as needed   Problem List / ED Course:  Patient is improving at this time and notes that dizziness has greatly improved since onset.  Patient has no focal deficits at this point and low suspicion for CVA.  CT scan of the head and cervical spine were unremarkable.  X-rays were unremarkable as well for any signs of long bone or joint fracture.  She was not tender palpation over the remainder of long bones and joints that were not imaged.  She was nontender palpation over thoracic and lumbar spine.  Patient notes that pain is much better at this time as well.  Patient was nontender palpation over chest wall and abdomen.  Will sign patient out to Dr. Lorette pending repeat evaluation and orthostatic vital signs   Social Determinants of Health:  None        Final diagnoses:  None    ED Discharge Orders     None          Daralene Lonni BIRCH, PA-C 01/01/25 2312  "

## 2025-01-01 NOTE — ED Notes (Signed)
 Patient transported to CT

## 2025-01-01 NOTE — ED Notes (Signed)
 [facility] to find out where to send the patient's prescription if she is prescribed something at [facility].  Was not able to speak to anyone as I got a voicemail.

## 2025-01-01 NOTE — ED Triage Notes (Signed)
 [facility] after a [facility] to the [facility].  Patient states her neck, rt shoulder and elbow, and hips are hurting.  Pt did not loose consciousness, but she doesn't know what made her fall. She is A&O x4 and does not take blood thinners.  VSS.

## 2025-01-01 NOTE — ED Provider Notes (Incomplete)
 11:07 PM Assumed care from [facility] PA and Dr. Suzette, please see their note for full history, physical and decision making until this point. In brief this is a 74 y.o. year old female who presented to the ED tonight with Fall     Fall w/ dizziness tonight. Still dizzy here. Trauma images are reassuring. Pending meds, second troponin and reevaluation for dispo.   Discharge instructions, including strict return precautions for new or worsening symptoms, given. Patient and/or family verbalized understanding and agreement with the plan as described.   Labs, studies and imaging reviewed by myself and considered in medical decision making if ordered. Imaging interpreted by radiology.  Labs Reviewed  COMPREHENSIVE METABOLIC PANEL WITH GFR - Abnormal; Notable for the following components:      Result Value   Glucose, Bld 227 (*)    AST 43 (*)    All other components within normal limits  CBC WITH DIFFERENTIAL/PLATELET - Abnormal; Notable for the following components:   Hemoglobin 11.8 (*)    All other components within normal limits  URINALYSIS, ROUTINE W REFLEX MICROSCOPIC - Abnormal; Notable for the following components:   Glucose, UA >=500 (*)    All other components within normal limits  TROPONIN T, HIGH SENSITIVITY  TROPONIN T, HIGH SENSITIVITY    DG Shoulder Right  Final Result    DG Elbow Complete Right  Final Result    DG Hip Unilat With Pelvis 2-3 Views Left  Final Result    DG Hip Unilat With Pelvis 2-3 Views Right  Final Result    DG Chest 1 View  Final Result    CT Head Wo Contrast  Final Result    CT Cervical Spine Wo Contrast  Final Result      No follow-ups on file.

## 2025-01-01 NOTE — ED Notes (Signed)
 Asked CNA to perform in and out cath.

## 2025-01-02 LAB — TROPONIN T, HIGH SENSITIVITY: Troponin T High Sensitivity: <15 ng/L (ref 0–19)

## 2025-01-02 MED ORDER — MECLIZINE HCL 12.5 MG PO TABS: 12.5000 mg | ORAL_TABLET | Freq: Three times a day (TID) | ORAL | 0 refills | Status: AC | PRN

## 2025-01-02 NOTE — ED Notes (Signed)
 Pt was ambulated with clinical research associate and RN @2140 . PT did good walking denies SOB and weakness.

## 2025-01-02 NOTE — ED Notes (Addendum)
 Pt is Aox4 for this RN. Pt endorsed falling tonight after losing her vision and fall to the ground. Pt endorses her pain of 3/10 is all over her body. Pt denies chest pain, blurry vision, and headache at this time.

## 2025-01-02 NOTE — ED Notes (Signed)
 Tech ambulated the pt. Pt needed minimal assistance to walk. Pt does walk slow but is steady and capable. Pt did not seem to be in any distress when ambulating around the nurses station. KCM~

## 2025-02-15 ENCOUNTER — Ambulatory Visit: Payer: Medicare PPO | Admitting: Urology

## 2025-02-23 ENCOUNTER — Ambulatory Visit: Admitting: Gastroenterology

## 2025-03-16 ENCOUNTER — Ambulatory Visit: Admitting: Urology

## 2025-05-27 ENCOUNTER — Ambulatory Visit: Admitting: Nurse Practitioner
